# Patient Record
Sex: Female | Born: 1935 | Race: White | Hispanic: No | Smoking: Former smoker
Health system: Southern US, Community
[De-identification: ages and names within clinical notes are randomized; demographics above are authoritative.]

## PROBLEM LIST (undated history)

## (undated) DIAGNOSIS — M1990 Unspecified osteoarthritis, unspecified site: Secondary | ICD-10-CM

## (undated) DIAGNOSIS — E119 Type 2 diabetes mellitus without complications: Secondary | ICD-10-CM

## (undated) DIAGNOSIS — Z794 Long term (current) use of insulin: Secondary | ICD-10-CM

## (undated) DIAGNOSIS — Z7901 Long term (current) use of anticoagulants: Secondary | ICD-10-CM

## (undated) DIAGNOSIS — I5032 Chronic diastolic (congestive) heart failure: Secondary | ICD-10-CM

## (undated) DIAGNOSIS — I10 Essential (primary) hypertension: Secondary | ICD-10-CM

## (undated) DIAGNOSIS — E7800 Pure hypercholesterolemia, unspecified: Secondary | ICD-10-CM

## (undated) DIAGNOSIS — I482 Chronic atrial fibrillation, unspecified: Secondary | ICD-10-CM

## (undated) DIAGNOSIS — K5901 Slow transit constipation: Secondary | ICD-10-CM

## (undated) DIAGNOSIS — D62 Acute posthemorrhagic anemia: Secondary | ICD-10-CM

## (undated) DIAGNOSIS — H409 Unspecified glaucoma: Secondary | ICD-10-CM

## (undated) DIAGNOSIS — E876 Hypokalemia: Secondary | ICD-10-CM

## (undated) HISTORY — DX: Acute posthemorrhagic anemia: D62

## (undated) HISTORY — PX: OTHER SURGICAL HISTORY: SHX169

## (undated) HISTORY — DX: Chronic diastolic (congestive) heart failure: I50.32

## (undated) HISTORY — DX: Chronic atrial fibrillation, unspecified: I48.20

## (undated) HISTORY — DX: Essential (primary) hypertension: I10

## (undated) HISTORY — DX: Type 2 diabetes mellitus without complications: Z79.4

## (undated) HISTORY — DX: Unspecified glaucoma: H40.9

## (undated) HISTORY — DX: Pure hypercholesterolemia, unspecified: E78.00

## (undated) HISTORY — PX: EYE SURGERY: SHX253

## (undated) HISTORY — DX: Type 2 diabetes mellitus without complications: E11.9

## (undated) HISTORY — DX: Slow transit constipation: K59.01

## (undated) HISTORY — DX: Hypokalemia: E87.6

## (undated) HISTORY — DX: Long term (current) use of anticoagulants: Z79.01

## (undated) HISTORY — DX: Unspecified osteoarthritis, unspecified site: M19.90

---

## 1963-07-22 HISTORY — PX: GANGLION CYST EXCISION: SHX1691

## 2006-11-26 ENCOUNTER — Inpatient Hospital Stay (HOSPITAL_COMMUNITY): Admission: EM | Admit: 2006-11-26 | Discharge: 2006-12-02 | Payer: Self-pay | Admitting: Emergency Medicine

## 2006-11-26 ENCOUNTER — Ambulatory Visit: Payer: Self-pay | Admitting: Hospitalist

## 2006-11-27 ENCOUNTER — Encounter (INDEPENDENT_AMBULATORY_CARE_PROVIDER_SITE_OTHER): Payer: Self-pay | Admitting: Interventional Cardiology

## 2007-02-05 ENCOUNTER — Ambulatory Visit (HOSPITAL_COMMUNITY): Admission: RE | Admit: 2007-02-05 | Discharge: 2007-02-05 | Payer: Self-pay | Admitting: Cardiology

## 2007-12-14 ENCOUNTER — Encounter: Admission: RE | Admit: 2007-12-14 | Discharge: 2007-12-14 | Payer: Self-pay | Admitting: Orthopedic Surgery

## 2008-01-27 ENCOUNTER — Encounter: Admission: RE | Admit: 2008-01-27 | Discharge: 2008-01-27 | Payer: Self-pay | Admitting: Neurosurgery

## 2008-07-04 ENCOUNTER — Encounter: Admission: RE | Admit: 2008-07-04 | Discharge: 2008-07-04 | Payer: Self-pay | Admitting: Neurosurgery

## 2010-07-30 ENCOUNTER — Encounter
Admission: RE | Admit: 2010-07-30 | Discharge: 2010-07-30 | Payer: Self-pay | Source: Home / Self Care | Attending: Internal Medicine | Admitting: Internal Medicine

## 2010-08-30 ENCOUNTER — Other Ambulatory Visit: Payer: Self-pay | Admitting: Neurosurgery

## 2010-08-30 DIAGNOSIS — M549 Dorsalgia, unspecified: Secondary | ICD-10-CM

## 2010-08-30 DIAGNOSIS — M5410 Radiculopathy, site unspecified: Secondary | ICD-10-CM

## 2010-09-03 ENCOUNTER — Ambulatory Visit
Admission: RE | Admit: 2010-09-03 | Discharge: 2010-09-03 | Disposition: A | Discharge status: Home / Self Care | Payer: Medicare Other | Source: Ambulatory Visit | Attending: Neurosurgery | Admitting: Neurosurgery

## 2010-09-03 DIAGNOSIS — M549 Dorsalgia, unspecified: Secondary | ICD-10-CM

## 2010-09-03 DIAGNOSIS — M5410 Radiculopathy, site unspecified: Secondary | ICD-10-CM

## 2010-12-03 NOTE — Consult Note (Signed)
Yolanda James, Yolanda James               ACCOUNT NO.:  [id number]   MEDICAL RECORD NO.:  [id number]          PATIENT TYPE:  INP   LOCATION:  1846                         FACILITY:  [facility]   PHYSICIAN:  Francisca December, M.D.  DATE OF BIRTH:  [birth year]   DATE OF CONSULTATION:  11/26/2006  DATE OF DISCHARGE:                                 CONSULTATION   REASON FOR CONSULTATION:  Heart failure.   HISTORY OF PRESENT ILLNESS:  [name] is a 77 year old woman, without  known prior cardiac disease but with a longstanding history  hypertension, who has developed progressive dyspnea and lower extremity  edema over the past 4-5 days.  She denies any orthopnea but has had mild  PND.  No cough, fever, or chills.  She has not felt palpitations,  dizziness, syncope, or near syncope.  She has been more fatigued and has  had a marked decrease in her exercise tolerance.  She finally presented  to [facility] today with these complaints, where an EKG showed atrial  fibrillation with rapid ventricular response.  She subsequently  transferred here to [facility] Emergency Room, where she has received an  IV Cardizem bolus and a constant infusion of 10 mg per hour.  This has  resulted in a decrease in her heart rate 130 beats per minute.  She has  received IV furosemide 20 mg.   PAST MEDICAL HISTORY:  1. Hypertension.  2. Hypercholesterolemia.  3. Glaucoma.  4. There is no prior history of myocardial infarction or heart      failure.   MEDICATIONS ON ADMISSION:  1. Norvasc 10 mg p.o. daily.  2. Simvastatin 20 mg p.o. daily.  3. HCTZ 25 mg p.o. daily.  4. Pilocarpine eye drops, 1 drop both eyes each day.  5. Lumigan eyedrops both eyes daily.   PAST SURGICAL HISTORY:  1. Bilateral cataract and glaucoma surgery.  2. She has had a left wrist cyst removed.   DRUG ALLERGIES:  NONE KNOWN.   FAMILY HISTORY:  Father died, age 61, of myocardial infarction.  Mother  died, age 35, cancer.   SOCIAL  HISTORY:  Married with three adult children, lives alone.  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  Retired Child psychotherapist.  Uses no  tobacco, ethanol, or illicit drugs.   REVIEW OF SYSTEMS:  Negative except as mentioned above.   PHYSICAL EXAMINATION:  VITAL SIGNS:  Blood pressure is 123/61, pulse is  120-132 and irregular regular, respiratory rate is 24, oxygen saturation  of 2 liters 93%.  GENERAL:  This is a well-appearing, comfortable, obese, 77 year old  woman, alert, cooperative in no distress.  HEENT:  Unremarkable.  The head is atraumatic and normocephalic.  The  pupils are equal, round, and reactive to the light.  Extraocular  movements are intact.  Sclerae are anicteric.  Oral mucosa is pink and  moist.  The tongue is not coated.  NECK:  Supple without thyromegaly or masses.  The carotid upstrokes are  normal.  There is no bruit.  There is no jugular venous distension.  CHEST:  Mild bibasilar rales.  Adequate  air movement.  HEART:  Tachycardiac, irregular rhythm, normal S1-S2.  There is a gallop  present.  ABDOMEN:  Obese, soft, nontender, protuberant.  Bowel sounds present all  quadrants.  EXTERNAL GENITALIA:  Without lesions, atrophic.  RECTAL:  Not performed.  EXTREMITIES:  Show full range of motion.  There is 1+ bilateral ankle  edema barely pitting.  Intact in distal pulses.  NEUROLOGICALLY:  No  focal deficits.  SKIN:  Warm, dry, and clear.   ACCESSORY CLINICAL DATA:  Admission hemogram is normal with a hemoglobin  of 12.9, hematocrit 39.5.  Potassium is 3.2.  Normal renal function.  Normal coagulation parameters.  Initial CK-MB and troponin are negative.  Chest x-ray shows cardiac enlargement with interstitial edema, mild.  EKG is as stated, no acute ischemic changes.   IMPRESSION:  1. Atrial fibrillation with rapid ventricular response, onset      uncertain.  2. Mild hypokalemia.  3. Symptoms of heart failure with cardiac enlargement, suspect      systolic  dysfunction.  4. Mild elevation of liver functions.  5. Obesity.  6. History of hypertension.  7. Dyslipidemia.   PLAN:  1. Will follow with you and obtain serial cardiac enzymes x2.  2. Obtain 2-D echocardiogram but not until heart rate more controlled.  3. Agree with daily BNP, D-dimer, and TSH.  4. Begin subcu Lovenox 1 mg/kg per pharmacy protocol.  5. Titrate IV Cardizem drip for heart rate control 5-20 mg per hour,      maintain systolic blood pressure above 045.  6. Strict I and O, daily weights.  7. Otherwise continue home meds.  8. Increase furosemide to 40 mg IV twice daily.  9. Replete potassium aggressively.  10.Further measures to be decided upon once 2-D echocardiogram is      complete.      Francisca December, M.D.  Electronically Signed     JHE/MEDQ  D:  11/26/2006  T:  11/26/2006  Job:  [id number]

## 2010-12-03 NOTE — Discharge Summary (Signed)
Yolanda James, Yolanda James               ACCOUNT NO.:  [id number]   MEDICAL RECORD NO.:  [id number]          PATIENT TYPE:  INP   LOCATION:  2034                         FACILITY:  [facility]   PHYSICIAN:  Yetta Barre, M.D.  DATE OF BIRTH:  06-03-1931   DATE OF ADMISSION:  11/26/2006  DATE OF DISCHARGE:  12/02/2006                               DISCHARGE SUMMARY   DISCHARGE DIAGNOSES:  1. Atrial fibrillation with rapid ventricular rate, newly-diagnosed.  2. Pulmonary edema/diastolic cardiomyopathy.  3. Hypertension.  4. Normocytic anemia.  5. Transaminitis secondary to hepatic congestion, secondary to problem      #2.  6. Glaucoma.   DISCHARGE MEDICATIONS:  1. Sotalol 80 mg p.o. b.i.d.  2. Diltiazem LA 360 mg p.o. daily.  3. Digoxin 0.125 mg p.o. daily.  4. Enalapril 5 mg p.o. b.i.d.  5. Warfarin 5 mg p.o. daily and further titration as an outpatient.   FOLLOWUP:  1. The patient is to follow up at Dr. Amil Amen' office on Dec 07, 2006,      for stat PT/INR check along with an EKG to check for prolongation      of QT interval.  Also we recommend that the patient get a CBC done      at that time to detect any acute drop after starting      anticoagulation.  2. The patient is to follow up with Dr. Amil Amen, on Dec 10, 2006, at      9:00 a.m. for blood pressure check along with titration of      medications p.r.n. for better rate control.  3. We also recommend that the patient follow up with her      ophthalmologist given that her pilocarpine and timolol drops was      stopped secondary to potential systemic side effects given her      current diagnosis.   IMAGES DONE THIS ADMISSION:  1. Chest x-ray done Nov 26, 2006, shows cardiomegaly and interstitial      edema.  2. CT angio of the chest done on Nov 26, 2006, showing no evidence of      pulmonary embolism.  Findings of congestive heart failure.  3. PCI of the ascending aorta.  Prominent borderline enlarged      mediastinal lymph  nodes may be related to CHF.  Followup CT      recommended in 2-3 months.  4. Tiny left lung nodule, attention on followup.  Indeterminate right      thyroid nodule.  5. Chest x-ray done on Nov 28, 2006, showing cardiomegaly and mild      edema with small pleural effusion.  Chest x-ray done on Nov 30, 2006, showing overall improvement in bilateral effusions and mild      interstitial edema since prior study.  No new findings.   CONSULTANTS THIS ADMISSION:  Dr. Amil Amen, cardiology.   PROCEDURES DURING THIS ADMISSION:  None.   BRIEF HISTORY AND PHYSICAL:  Please see medical records for full  details.  Ms. Yolanda James is a 77 year old very pleasant white woman with a  history  of hypertension, hyperlipidemia presenting to the ED on Nov 26, 2006, with complaints of shortness of breath and feeling like her heart  was fluttery over the last 3-4 days.  She has not noticed any chest  pain, fever, chills, cough, or any disruption in her daily life  secondary to the same.  She presented to the urgent care, where she was  found to be in atrial fibrillation with RVR and was asked to go to the  emergency department for further evaluation.  In the ER, the patient was  put on a diltiazem drip.   ALLERGIES:  No known drug allergies.   PAST MEDICAL HISTORY:  1. Hypertension.  2. Hyperlipidemia.  3. Glaucoma.  4. History of bilateral cataracts status post removal.   MEDICATIONS:  1. Norvasc 10 mg p.o. daily.  2. Hydrochlorothiazide 25 mg p.o. daily.  3. Zocor 20 mg p.o. daily.  4. Pilocarpine eye drops q.i.d.  5. Cosopt drops twice a day.  6. Lumigan drops daily.   SUBSTANCE HISTORY:  The patient is a former smoker.  She quit many years  ago.  No history of alcohol or drug use.   SOCIAL HISTORY:  The patient is married, and she has Medicare.   FAMILY HISTORY:  The patient's mother died at 35 of some sort of cancer.  The patient's father died at 40 years of age.  The patient has 13   siblings with diabetes and cancer predominant.  The patient has 3  children.   PHYSICAL EXAMINATION:  VITAL SIGNS:  Temperature 98.4, blood pressure  126/84, pulse 133, respiratory rate at 20, O2 sats 96% on room air.  GENERAL APPEARANCE:  The patient is an overweight woman, in no acute  distress.  HEENT:  Eyes:  Pupils asymmetric without reactivity secondary  to cataract surgery.  ENT:  Oropharynx clear.  No erythema or exudate.  NECK:  Supple.  No adenopathy or thyromegaly.  LUNGS:  Fair air movement, bilateral crackles in lower one-thirds, right  more than left.  HEART:  Irregularly irregular.  Tachycardic.  No murmurs, rubs, or  gallops.  No JVD.  ABDOMEN:  Soft, nondistended, nontender.  Bowel sounds present.  EXTREMITIES:  2+ lower extremity edema to proximal calves.  NEUROLOGICALLY:  Alert and oriented x4.  Grossly nonfocal.  PSYCH:  Appropriate.   LABORATORY ON ADMISSION:  Sodium 130, potassium 3.2, chloride 101,  bicarb 30, BUN 19, creatinine 0.8, glucose 121, total bilirubin 1.1, alk  phos 72, SGOT 48, SGPT 70, total protein 6.8, albumin 3.4, calcium 9.1.  Hemoglobin 12.9, hematocrit 39.5, MCV 81.9, RDW 15.5, ANC 7.1, white  cells 9.5, platelets 194.  PT 14.1, INR 1.1, PTT 29.  EKG showing atrial  fibrillation, poor R wave progression, Q-wave inversion in 1 and avL.   HOSPITAL COURSE:  1. Atrial fibrillation with RVR:  Because of the patient's new onset      atrial fibrillation, uncertain.  Acute coronary syndrome was ruled      out with negative cardiac enzymes x3.  CT angio of the chest ruled      out pulmonary embolus.  The patient did not appear to have any      infectious source given normal CBC and no history of fever or      chills.  The patient's TSH was within normal limits.  A 2D      echocardiogram was done to rule out other abnormalities.  No severe      atrial stenosis  was noted.  The patient was admitted to step-down     unit given that she had atrial  fibrillation with RVR and was on a      diltiazem drip for titration of her medications and close      observation as well.  The patient's heart rate was difficult to      control just on diltiazem drip, hence she was loaded with digoxin      and started on digoxin p.o. daily.  The patient's heart rate did      not respond with the two medications on board as well, hence she      was started on sotalol on Dec 01, 2006.  Beta blocker IV p.r.n. was      used also prior to this for better rate control.  Of note, the      patient's digoxin was held on Nov 29, 2006, given the patient's      persistent hypokalemia.  The patient's rate remained controlled      after Dec 01, 2006; hence, she is stable for discharge today.  She      was started on anticoagulation on day 1 with Lovenox and Coumadin.      The patient will need to follow up at Dr. Randa Evens' office for      further titration and serial PT/INR checks.  On the day of      discharge, the patient's INR was 2.7, which is well within the      therapeutic range.  Also of note, the patient developed a prolonged      QT interval with sotalol mainly 471-500 msec.  Hence, a repeat EKG      needs to be done soon, which has been scheduled for Dec 07, 2006,      to check for progression of the QT interval.  We will defer the      outpatient treatment to the cardiologist.  Also, the patient can      have elective cardioversion after being anticoagulated for at least      3 months.  Again, this will be deferred to outpatient cardiology.  2. Diastolic cardiomyopathy with acute decompensation secondary to      atrial fibrillation:  On admission, the patient had good O2 sats,      but on clinical exam, she did have crackles, and chest x-ray was      compatible with the same.  BNP on admission was not very elevated,      namely, 266.  Serial BNPs that were checked showed no more further      elevation than the same.  Hence, this should not be used in  order      to determine if the patient actually is in heart failure.  The      patient was given Lasix initially to help diuresis and then was      started on Enalapril.  The patient can continue the same at home.  3. Hypertension:  The patient's blood pressure was mildly elevated on      admission.  Her blood pressure medications at home were HCTZ and      Norvasc; however, given her new finding of atrial fibrillation      along with diastolic cardiomyopathy, the patient will be on a      different set of medications, namely, Enalapril, sotalol, and Lasix      p.r.n.  Further titration of the medications can be  done as an      outpatient.  4. Normocytic anemia:  The patient's hemoglobin on admission was 12.9;      however, it dropped to 10.9 by the day of discharge.  Anemia panel      was sent for, and the results show that she had an RBC folate of      1007, ferritin of 146, iron of 39, TIBC 339, vitamin B12 level of     314.  Haptoglobin 247.  Antiphospholipid showed unremarkable      morphology.  Also of note, on 2D echocardiogram, it was stated that      the patient had (?) hematoma which revealed pericardial effusion,      hence, this was concerning.  On further conversations with      cardiologist, this was a misprint; hence, the patient does not have      the hematoma as was described on the 2D echocardiogram.  Anyhow,      the patient had been started on anticoagulation for the atrial      fibrillation, and now that she is on anticoagulation, a repeat CBC      needs to be checked when she sees Dr. Amil Amen on Dec 07, 2006, to      see if hemoglobin remains stable.  5. Glaucoma:  The patient was on pilocarpine and timolol, Cosopt, and      Lumigan as an outpatient.  Since timolol and pilocarpine have      significant side effects and patient has been diagnosed with atrial      fibrillation with RVR, pilocarpine as well as timolol has been      discontinued.  She is to continue  with the other medications.  The      patient was advised to contact her ophthalmologist after discharge      for change in her medications.  6. Transaminitis:  This was thought to be secondary to hepatic      congestion from heart failure.  Serial comprehensive metabolic      panels checked showed down trend of the transaminases.  This can be      checked as an outpatient as well.   DISCHARGE VITALS:  Temperature 97.9, blood pressure 116/65, heart rate  71, respiratory rate of 20, O2 sats 94% on room air.   DISCHARGE LABORATORY:  Sodium 138, potassium 3.9, chloride 103, bicarb  30, BUN 18, creatinine 1.1, glucose 109, calcium 8.4, hemoglobin 10.9,  white cells 7.4, platelets 182, PT 30.4, INR 2.7, BNP 310.      Yetta Barre, M.D.  Electronically Signed     SS/MEDQ  D:  12/02/2006  T:  12/02/2006  Job:  [id number]

## 2010-12-03 NOTE — Op Note (Signed)
NAMEJETTY, BERLAND               ACCOUNT NO.:  [id number]   MEDICAL RECORD NO.:  [id number]          PATIENT TYPE:  OIB   LOCATION:  2899                         FACILITY:  [facility]   PHYSICIAN:  Francisca December, M.D.  DATE OF BIRTH:  1931/03/22   DATE OF PROCEDURE:  02/05/2007  DATE OF DISCHARGE:  02/05/2007                               OPERATIVE REPORT   CARDIOLOGIST:  Francisca December, M.D.   PROCEDURE PERFORMED:  Direct-current cardioversion.   INDICATIONS:  Yolanda James is a 77 year old woman, who has developed  atrial fibrillation.  It was initially with a rapid ventricular  response.  It has been a controlled rate, now with diltiazem ER 360 mg  p.o. q.d., Lanoxin 0.125 mg p.o. q.d. and sotalol 80 mg twice daily.  She has been systemically anticoagulated to a therapeutic level for the  past 3 weeks.  Her INR today is 2.8.  She is to undergo direct-current  cardioversion at this time.   PROCEDURAL NOTE:  While monitoring heart rate, blood pressure, O2  saturation, and ECG, and under the direct supervision of Dr. Michelle Piper of  the anesthesia department, the patient was administered 175 mg of IV  Pentothal.  After establishing deep anesthesia, she received a single  dose of transthoracic energy at 150 J synchronized/biphasic.  This  resulted in prompt return of sinus rhythm/sinus bradycardia with PACs.   IMPRESSION:  Successful direct-current cardioversion of atrial  fibrillation to sinus rhythm.   PLAN:  Decrease diltiazem ER to 180 mg p.o. q.d.  Continue warfarin,  sotalol and digoxin unchanged.  Plan for office visit in 2 to 3 weeks.      Francisca December, M.D.  Electronically Signed     JHE/MEDQ  D:  02/05/2007  T:  02/06/2007  Job:  [id number]

## 2011-05-05 LAB — BASIC METABOLIC PANEL
CO2: 28
Calcium: 8.9
Creatinine, Ser: 0.95
GFR calc Af Amer: >60
Sodium: 139

## 2011-05-05 LAB — PROTIME-INR
INR: 2.6 — ABNORMAL HIGH
Prothrombin Time: 28.9 — ABNORMAL HIGH

## 2011-05-05 LAB — CBC
Hemoglobin: 12.1
MCHC: 33.1
RBC: 4.55
WBC: 9.7

## 2011-05-05 LAB — APTT: aPTT: 45 — ABNORMAL HIGH

## 2013-05-12 ENCOUNTER — Other Ambulatory Visit: Payer: Self-pay | Admitting: Interventional Cardiology

## 2013-05-26 ENCOUNTER — Ambulatory Visit (INDEPENDENT_AMBULATORY_CARE_PROVIDER_SITE_OTHER): Payer: Medicare Other | Admitting: Pharmacist

## 2013-05-26 DIAGNOSIS — I482 Chronic atrial fibrillation, unspecified: Secondary | ICD-10-CM | POA: Insufficient documentation

## 2013-05-26 DIAGNOSIS — I4891 Unspecified atrial fibrillation: Secondary | ICD-10-CM

## 2013-05-26 LAB — POCT INR: INR: 2.3

## 2013-07-07 ENCOUNTER — Ambulatory Visit (INDEPENDENT_AMBULATORY_CARE_PROVIDER_SITE_OTHER): Payer: Medicare Other | Admitting: Pharmacist

## 2013-07-07 DIAGNOSIS — I4891 Unspecified atrial fibrillation: Secondary | ICD-10-CM

## 2013-07-07 LAB — POCT INR: INR: 2.6

## 2013-08-14 ENCOUNTER — Other Ambulatory Visit: Payer: Self-pay | Admitting: *Deleted

## 2013-08-14 DIAGNOSIS — Z79899 Other long term (current) drug therapy: Secondary | ICD-10-CM

## 2013-08-14 DIAGNOSIS — E782 Mixed hyperlipidemia: Secondary | ICD-10-CM

## 2013-08-18 ENCOUNTER — Ambulatory Visit (INDEPENDENT_AMBULATORY_CARE_PROVIDER_SITE_OTHER): Payer: Medicare Other

## 2013-08-18 DIAGNOSIS — I4891 Unspecified atrial fibrillation: Secondary | ICD-10-CM

## 2013-08-18 DIAGNOSIS — Z5181 Encounter for therapeutic drug level monitoring: Secondary | ICD-10-CM

## 2013-08-18 LAB — POCT INR: INR: 2.1

## 2013-08-30 ENCOUNTER — Other Ambulatory Visit: Payer: Self-pay | Admitting: Interventional Cardiology

## 2013-09-02 ENCOUNTER — Other Ambulatory Visit: Payer: Self-pay | Admitting: Interventional Cardiology

## 2013-09-19 ENCOUNTER — Other Ambulatory Visit (INDEPENDENT_AMBULATORY_CARE_PROVIDER_SITE_OTHER): Payer: Medicare Other

## 2013-09-19 DIAGNOSIS — E782 Mixed hyperlipidemia: Secondary | ICD-10-CM

## 2013-09-19 DIAGNOSIS — Z79899 Other long term (current) drug therapy: Secondary | ICD-10-CM

## 2013-09-19 LAB — ALT: ALT: 21 U/L (ref 0–35)

## 2013-09-20 LAB — NMR LIPOPROFILE WITH LIPIDS
Cholesterol, Total: 136 mg/dL (ref <200)
HDL Particle Number: 31.4 umol/L (ref >=30.5)
HDL SIZE: 9.1 nm — AB (ref >=9.2)
HDL-C: 51 mg/dL (ref >=40)
LDL (calc): 61 mg/dL (ref <100)
LDL PARTICLE NUMBER: 892 nmol/L (ref <1000)
LDL Size: 20.4 nm — ABNORMAL LOW (ref >20.5)
LP-IR SCORE: 43 (ref <=45)
Large HDL-P: 5 umol/L (ref >=4.8)
Large VLDL-P: 1.7 nmol/L (ref <=2.7)
Small LDL Particle Number: 491 nmol/L (ref <=527)
TRIGLYCERIDES: 118 mg/dL (ref <150)
VLDL SIZE: 44.4 nm (ref <=46.6)

## 2013-09-23 ENCOUNTER — Telehealth: Payer: Self-pay | Admitting: Cardiology

## 2013-09-23 DIAGNOSIS — E782 Mixed hyperlipidemia: Secondary | ICD-10-CM

## 2013-09-23 NOTE — Telephone Encounter (Signed)
Message copied by Alcario Drought on Fri Sep 23, 2013  1:37 PM ------      Message from: SMART, Maralyn Sago      Created: Thu Sep 22, 2013 11:10 AM       RF:  Diabetes, HTN, age - LDL goal < 100 at least, non-HDL < 130 at least, LDL-P prefer < 1000.      Meds:  Lipitor 80 mg qd, Co-Q 10 100 mg qd, fish oil 2 g/d.      Excellent panel!      ALT normal.      Plan:      1.  No change in meds.      2.  Recheck lipid panel and hepatic panel in 6 months.      Please notify patient, and set up labs. Thanks. ------

## 2013-09-23 NOTE — Telephone Encounter (Signed)
Labs ordered.

## 2013-09-29 ENCOUNTER — Ambulatory Visit (INDEPENDENT_AMBULATORY_CARE_PROVIDER_SITE_OTHER): Payer: Medicare Other

## 2013-09-29 DIAGNOSIS — I4891 Unspecified atrial fibrillation: Secondary | ICD-10-CM

## 2013-09-29 DIAGNOSIS — Z5181 Encounter for therapeutic drug level monitoring: Secondary | ICD-10-CM

## 2013-09-29 LAB — POCT INR: INR: 2.1

## 2013-11-09 ENCOUNTER — Other Ambulatory Visit: Payer: Self-pay | Admitting: Interventional Cardiology

## 2013-11-09 ENCOUNTER — Ambulatory Visit (INDEPENDENT_AMBULATORY_CARE_PROVIDER_SITE_OTHER): Payer: Medicare Other | Admitting: *Deleted

## 2013-11-09 ENCOUNTER — Ambulatory Visit: Payer: Medicare Other | Admitting: Interventional Cardiology

## 2013-11-09 DIAGNOSIS — I4891 Unspecified atrial fibrillation: Secondary | ICD-10-CM

## 2013-11-09 DIAGNOSIS — Z5181 Encounter for therapeutic drug level monitoring: Secondary | ICD-10-CM

## 2013-11-09 LAB — POCT INR: INR: 3

## 2013-11-23 ENCOUNTER — Other Ambulatory Visit: Payer: Self-pay | Admitting: Interventional Cardiology

## 2013-12-21 ENCOUNTER — Other Ambulatory Visit: Payer: Self-pay | Admitting: Interventional Cardiology

## 2013-12-23 ENCOUNTER — Encounter: Payer: Self-pay | Admitting: Cardiology

## 2013-12-23 ENCOUNTER — Ambulatory Visit (INDEPENDENT_AMBULATORY_CARE_PROVIDER_SITE_OTHER): Payer: Medicare Other

## 2013-12-23 ENCOUNTER — Encounter: Payer: Self-pay | Admitting: Interventional Cardiology

## 2013-12-23 ENCOUNTER — Ambulatory Visit (INDEPENDENT_AMBULATORY_CARE_PROVIDER_SITE_OTHER): Payer: Medicare Other | Admitting: Interventional Cardiology

## 2013-12-23 VITALS — BP 130/90 | HR 105 | Ht 64.0 in | Wt 210.0 lb

## 2013-12-23 DIAGNOSIS — I4891 Unspecified atrial fibrillation: Secondary | ICD-10-CM

## 2013-12-23 DIAGNOSIS — E7800 Pure hypercholesterolemia, unspecified: Secondary | ICD-10-CM | POA: Insufficient documentation

## 2013-12-23 DIAGNOSIS — I10 Essential (primary) hypertension: Secondary | ICD-10-CM | POA: Insufficient documentation

## 2013-12-23 DIAGNOSIS — Z5181 Encounter for therapeutic drug level monitoring: Secondary | ICD-10-CM

## 2013-12-23 LAB — POCT INR: INR: 2.7

## 2013-12-23 MED ORDER — DIGOXIN 125 MCG PO TABS: 0.1250 mg | ORAL_TABLET | ORAL | Status: DC

## 2013-12-23 NOTE — Progress Notes (Signed)
Patient ID: Yolanda James, female   DOB: [birth year], 80 y.o.   MRN: [id number]    [address], [place], [place]  [address] Phone: [phone] Fax:  [phone]  Date:  12/23/2013   ID:  Yolanda James, DOB [birth year], MRN [id number]  PCP:  No primary provider on file.      History of Present Illness: Yolanda James is a 80 y.o. female with AFib and hyperlipidemia. Sotalol was stopped in 2012. She is rate controled and anticoagulated. Most BP Readings at home in the 629 systolic range.  HR at home in the 70-90s range typically. Atrial Fibrillation F/U:  lost weight with portion control. Husband died in 2011-09-20. Denies : Chest pain.  Dizziness.  Leg edema.  Orthopnea.  Palpitations.  Shortness of breath.  Syncope.  Since last visit she  No bleeding. No falls. Reports mild swelling of BLE if she doesn't elevate feet. Has not been walking secondary to arthritis. Has been doing stationary bike at home at moderate pace w/o chest pain or SHOB. Has been trying to eat healthier. BP well controlled at home. BP readings around 104/74 to 130/79. Glucose readings are usually less than 100. Reports her last HgbA1c is around 6.1   Wt Readings from Last 3 Encounters:  12/23/13 210 lb (95.255 kg)     Past Medical History  Diagnosis Date  . Diastolic heart failure     associated with AFIB RVR  . A-fib     post DCCV 02/05/2007  . Pure hypercholesterolemia   . Essential hypertension, benign   . Glaucoma   . Osteoarthritis     hands and knees    Current Outpatient Prescriptions  Medication Sig Dispense Refill  . atorvastatin (LIPITOR) 80 MG tablet TAKE 1 TABLET BY MOUTH EVERY DAY  90 tablet  1  . Coenzyme Q10 (CO Q 10) 100 MG CAPS Take 1 capsule by mouth daily.      . digoxin (LANOXIN) 0.125 MG tablet Take 1 tablet (0.125 mg total) by mouth every other day.      Marland Kitchen DILT-XR 180 MG 24 hr capsule TAKE ONE CAPSULE BY MOUTH DAILY  90 capsule  0  . dorzolamide-timolol (COSOPT)  22.3-6.8 MG/ML ophthalmic solution 1 drop 2 (two) times daily.       . enalapril (VASOTEC) 5 MG tablet Take 5 mg by mouth 2 (two) times daily.       . furosemide (LASIX) 40 MG tablet TAKE 1 TABLET BY MOUTH EVERY DAY  90 tablet  0  . glipiZIDE (GLUCOTROL XL) 2.5 MG 24 hr tablet Take 2.5 mg by mouth 2 (two) times daily.       Marland Kitchen LUMIGAN 0.01 % SOLN 1 drop at bedtime.       . metFORMIN (GLUCOPHAGE) 500 MG tablet Take 500 mg by mouth 2 (two) times daily with a meal.       . Omega-3 Fatty Acids (FISH OIL) 1000 MG CAPS Take 1,000 mg by mouth 2 (two) times daily.      . pilocarpine (PILOCAR) 4 % ophthalmic solution 1 drop 4 (four) times daily.       . potassium chloride (MICRO-K) 10 MEQ CR capsule TAKE 1 CAPSULE BY MOUTH EVERY DAY  90 capsule  0  . warfarin (COUMADIN) 5 MG tablet TAKE 1 TABLET BY MOUTH EVERY DAY EXCEPT ON MONDAY TAKE 1&1/2 TABLETS  120 tablet  0   No current facility-administered medications for this visit.  Allergies:   No Known Allergies  Social History:  The patient  reports that she has quit smoking. She does not have any smokeless tobacco history on file. She reports that she does not drink alcohol or use illicit drugs.   Family History:  The patient's family history includes Arrhythmia in her sister.   ROS:  Please see the history of present illness.  No nausea, vomiting.  No fevers, chills.  No focal weakness.  No dysuria.   All other systems reviewed and negative.   PHYSICAL EXAM: VS:  BP 130/90  Pulse 105  Ht 5\' 4"  (1.626 m)  Wt 210 lb (95.255 kg)  BMI 36.03 kg/m2 Well nourished, well developed, in no acute distress HEENT: normal Neck: no JVD, no carotid bruits Cardiac:  normal S1, S2; irregulary irregular, borderline rate control Lungs:  clear to auscultation bilaterally, no wheezing, rhonchi or rales Abd: soft, nontender, no hepatomegaly Ext: no edema Skin: warm and dry Neuro:   no focal abnormalities noted  EKG:  AFib, incomplete LBBB, no significant ST  segment changes    ASSESSMENT AND PLAN:  Atrial fibrillation  Continue Digoxin Tablet, 0.125 Milligram, TAKE ONE TABLET BY MOUTH EVERY other DAY Continue Diltiazem HCl CR Capsule, extended release, 180, TAKE ONE CAPSULE BY MOUTH ONCE DAILY       Notes: Rate controlled in the past.  Slight HR elevation today, after some excitement in th elobby, where she was forgotten and not put in a room.  No sx. Digoxin level 1.6 last year. Coumadin for stroke prevention.    2. Essential hypertension, benign  Continue Enalapril Maleate Tablet, 5 Milligram, TAKE 1 TABLET BY MOUTH TWICE DAILY Notes: BP controlled at home.    3. Hypercholesteremia, pure  Continue Lipitor Tablet, 80 Milligram, TAKE 1 TABLET BY MOUTH ONCE DAILY Notes: Particle number had increased in 2014 but better in 3/15. Trying to reinforce low fat diet.    Preventive Medicine  Adult topics discussed:  Exercise: at least 30 minutes of aerobic exercise, 5 days a week.      Signed, Mina Marble, MD, [facility] 12/23/2013 12:11 PM

## 2013-12-23 NOTE — Patient Instructions (Signed)
Your physician recommends that you continue on your current medications as directed. Please refer to the Current Medication list given to you today.  Your physician wants you to follow-up in: 1 year with Dr. Varanasi. You will receive a reminder letter in the mail two months in advance. If you don't receive a letter, please call our office to schedule the follow-up appointment.  

## 2014-01-23 ENCOUNTER — Other Ambulatory Visit: Payer: Self-pay | Admitting: Interventional Cardiology

## 2014-02-06 ENCOUNTER — Ambulatory Visit (INDEPENDENT_AMBULATORY_CARE_PROVIDER_SITE_OTHER): Payer: Medicare Other

## 2014-02-06 DIAGNOSIS — I4891 Unspecified atrial fibrillation: Secondary | ICD-10-CM

## 2014-02-06 DIAGNOSIS — Z5181 Encounter for therapeutic drug level monitoring: Secondary | ICD-10-CM

## 2014-02-06 LAB — POCT INR: INR: 3

## 2014-02-23 ENCOUNTER — Other Ambulatory Visit: Payer: Self-pay | Admitting: Interventional Cardiology

## 2014-02-24 ENCOUNTER — Other Ambulatory Visit: Payer: Self-pay

## 2014-02-24 MED ORDER — FUROSEMIDE 40 MG PO TABS: ORAL_TABLET | ORAL | Status: DC

## 2014-03-13 ENCOUNTER — Other Ambulatory Visit: Payer: Self-pay

## 2014-03-13 DIAGNOSIS — Z1231 Encounter for screening mammogram for malignant neoplasm of breast: Secondary | ICD-10-CM

## 2014-03-20 ENCOUNTER — Ambulatory Visit (INDEPENDENT_AMBULATORY_CARE_PROVIDER_SITE_OTHER): Payer: Medicare Other | Admitting: Pharmacist

## 2014-03-20 DIAGNOSIS — Z5181 Encounter for therapeutic drug level monitoring: Secondary | ICD-10-CM

## 2014-03-20 DIAGNOSIS — I4891 Unspecified atrial fibrillation: Secondary | ICD-10-CM

## 2014-03-20 LAB — POCT INR: INR: 2.3

## 2014-03-28 ENCOUNTER — Other Ambulatory Visit: Payer: Medicare Other

## 2014-03-28 ENCOUNTER — Ambulatory Visit
Admission: RE | Admit: 2014-03-28 | Discharge: 2014-03-28 | Disposition: A | Discharge status: Home / Self Care | Payer: Medicare Other | Source: Ambulatory Visit

## 2014-03-28 DIAGNOSIS — Z1231 Encounter for screening mammogram for malignant neoplasm of breast: Secondary | ICD-10-CM

## 2014-03-31 ENCOUNTER — Other Ambulatory Visit: Payer: Self-pay | Admitting: Internal Medicine

## 2014-03-31 DIAGNOSIS — R928 Other abnormal and inconclusive findings on diagnostic imaging of breast: Secondary | ICD-10-CM

## 2014-04-11 ENCOUNTER — Ambulatory Visit
Admission: RE | Admit: 2014-04-11 | Discharge: 2014-04-11 | Disposition: A | Discharge status: Home / Self Care | Payer: Medicare Other | Source: Ambulatory Visit | Attending: Internal Medicine | Admitting: Internal Medicine

## 2014-04-11 DIAGNOSIS — R928 Other abnormal and inconclusive findings on diagnostic imaging of breast: Secondary | ICD-10-CM

## 2014-04-22 ENCOUNTER — Other Ambulatory Visit: Payer: Self-pay | Admitting: Interventional Cardiology

## 2014-05-01 ENCOUNTER — Ambulatory Visit (INDEPENDENT_AMBULATORY_CARE_PROVIDER_SITE_OTHER): Payer: Medicare Other

## 2014-05-01 DIAGNOSIS — I4891 Unspecified atrial fibrillation: Secondary | ICD-10-CM

## 2014-05-01 DIAGNOSIS — Z5181 Encounter for therapeutic drug level monitoring: Secondary | ICD-10-CM

## 2014-05-01 LAB — POCT INR: INR: 2.4

## 2014-05-05 ENCOUNTER — Other Ambulatory Visit: Payer: Self-pay | Admitting: Interventional Cardiology

## 2014-06-12 ENCOUNTER — Ambulatory Visit (INDEPENDENT_AMBULATORY_CARE_PROVIDER_SITE_OTHER): Payer: Medicare Other | Admitting: Pharmacist

## 2014-06-12 DIAGNOSIS — I4891 Unspecified atrial fibrillation: Secondary | ICD-10-CM

## 2014-06-12 DIAGNOSIS — Z5181 Encounter for therapeutic drug level monitoring: Secondary | ICD-10-CM

## 2014-06-12 LAB — POCT INR: INR: 2.4

## 2014-06-24 ENCOUNTER — Other Ambulatory Visit: Payer: Self-pay | Admitting: Interventional Cardiology

## 2014-07-24 ENCOUNTER — Ambulatory Visit (INDEPENDENT_AMBULATORY_CARE_PROVIDER_SITE_OTHER): Payer: Medicare Other | Admitting: *Deleted

## 2014-07-24 DIAGNOSIS — Z5181 Encounter for therapeutic drug level monitoring: Secondary | ICD-10-CM

## 2014-07-24 DIAGNOSIS — I4891 Unspecified atrial fibrillation: Secondary | ICD-10-CM

## 2014-07-24 LAB — POCT INR: INR: 2.3

## 2014-08-26 ENCOUNTER — Other Ambulatory Visit: Payer: Self-pay | Admitting: Interventional Cardiology

## 2014-09-07 ENCOUNTER — Ambulatory Visit (INDEPENDENT_AMBULATORY_CARE_PROVIDER_SITE_OTHER): Payer: Medicare Other | Admitting: *Deleted

## 2014-09-07 DIAGNOSIS — I4891 Unspecified atrial fibrillation: Secondary | ICD-10-CM

## 2014-09-07 DIAGNOSIS — Z5181 Encounter for therapeutic drug level monitoring: Secondary | ICD-10-CM

## 2014-09-07 LAB — POCT INR: INR: 2.6

## 2014-10-02 NOTE — Telephone Encounter (Signed)
Has this patient had their usual labs as previously ordered?

## 2014-10-15 ENCOUNTER — Other Ambulatory Visit: Payer: Self-pay | Admitting: Interventional Cardiology

## 2014-10-17 ENCOUNTER — Other Ambulatory Visit: Payer: Self-pay | Admitting: Interventional Cardiology

## 2014-10-19 ENCOUNTER — Ambulatory Visit (INDEPENDENT_AMBULATORY_CARE_PROVIDER_SITE_OTHER): Payer: Medicare Other | Admitting: Pharmacist

## 2014-10-19 DIAGNOSIS — I4891 Unspecified atrial fibrillation: Secondary | ICD-10-CM | POA: Diagnosis not present

## 2014-10-19 DIAGNOSIS — Z5181 Encounter for therapeutic drug level monitoring: Secondary | ICD-10-CM

## 2014-10-19 LAB — POCT INR: INR: 2.5

## 2014-11-29 ENCOUNTER — Other Ambulatory Visit: Payer: Self-pay | Admitting: Interventional Cardiology

## 2014-11-30 ENCOUNTER — Ambulatory Visit (INDEPENDENT_AMBULATORY_CARE_PROVIDER_SITE_OTHER): Payer: Medicare Other | Admitting: *Deleted

## 2014-11-30 DIAGNOSIS — Z5181 Encounter for therapeutic drug level monitoring: Secondary | ICD-10-CM

## 2014-11-30 DIAGNOSIS — I4891 Unspecified atrial fibrillation: Secondary | ICD-10-CM | POA: Diagnosis not present

## 2014-11-30 LAB — POCT INR: INR: 2.5

## 2014-11-30 NOTE — Telephone Encounter (Signed)
Per note 6.5.15

## 2015-01-10 ENCOUNTER — Other Ambulatory Visit: Payer: Self-pay | Admitting: Interventional Cardiology

## 2015-01-12 ENCOUNTER — Ambulatory Visit (INDEPENDENT_AMBULATORY_CARE_PROVIDER_SITE_OTHER): Payer: Medicare Other | Admitting: Interventional Cardiology

## 2015-01-12 ENCOUNTER — Ambulatory Visit (INDEPENDENT_AMBULATORY_CARE_PROVIDER_SITE_OTHER): Payer: Medicare Other | Admitting: *Deleted

## 2015-01-12 ENCOUNTER — Encounter: Payer: Self-pay | Admitting: Interventional Cardiology

## 2015-01-12 VITALS — BP 126/68 | HR 75 | Ht 64.0 in | Wt 217.0 lb

## 2015-01-12 DIAGNOSIS — Z5181 Encounter for therapeutic drug level monitoring: Secondary | ICD-10-CM | POA: Diagnosis not present

## 2015-01-12 DIAGNOSIS — I4891 Unspecified atrial fibrillation: Secondary | ICD-10-CM

## 2015-01-12 DIAGNOSIS — I10 Essential (primary) hypertension: Secondary | ICD-10-CM

## 2015-01-12 DIAGNOSIS — E7800 Pure hypercholesterolemia, unspecified: Secondary | ICD-10-CM

## 2015-01-12 LAB — BASIC METABOLIC PANEL
BUN: 18 mg/dL (ref 6–23)
CO2: 33 meq/L — AB (ref 19–32)
Calcium: 9.5 mg/dL (ref 8.4–10.5)
Chloride: 101 mEq/L (ref 96–112)
Creatinine, Ser: 0.84 mg/dL (ref 0.40–1.20)
GFR: 68.96 mL/min (ref >60.00)
GLUCOSE: 101 mg/dL — AB (ref 70–99)
Potassium: 4 mEq/L (ref 3.5–5.1)
Sodium: 138 mEq/L (ref 135–145)

## 2015-01-12 LAB — POCT INR: INR: 2.3

## 2015-01-12 NOTE — Patient Instructions (Signed)
**Note De-Identified  Obfuscation** Medication Instructions:  Same-no change  Labwork: Digoxin level and BMET today  Testing/Procedures: None  Follow-Up: Your physician wants you to follow-up in: 1 year. You will receive a reminder letter in the mail two months in advance. If you don't receive a letter, please call our office to schedule the follow-up appointment.

## 2015-01-12 NOTE — Progress Notes (Signed)
Patient ID: Yolanda James, female   DOB: 1930/09/29, 81 y.o.   MRN: [id number]     Cardiology Office Note   Date:  01/12/2015   ID:  Yolanda James, DOB January 29, 1931, MRN [id number]  PCP:  [name], MD    No chief complaint on file. f/u AFib   Wt Readings from Last 3 Encounters:  01/12/15 217 lb (98.431 kg)  12/23/13 210 lb (95.255 kg)       History of Present Illness: Yolanda James is a 81 y.o. female  with AFib and hyperlipidemia. Sotalol was stopped in 2010-10-23. She is rate controled and anticoagulated. Most BP Readings at home in the 211 systolic range. HR at home in the 70-90s range typically. Atrial Fibrillation F/U:  lost weight with portion control. Husband died in 10-23-2011. Denies : Chest pain.  Dizziness.  Leg edema.  Orthopnea.  Palpitations.  Shortness of breath.  Syncope.  Since last visit she No bleeding. No falls. Reports mild swelling of BLE if she doesn't elevate feet. Has not been walking secondary to arthritis. She has not been riding the bike as much recently.  She had to stay with her sister to help take care of her.  Blood sugar has been well controlled.  HR at home is in the 90s typically    Past Medical History  Diagnosis Date  . Diastolic heart failure     associated with AFIB RVR  . A-fib     post DCCV 02/05/2007  . Pure hypercholesterolemia   . Essential hypertension, benign   . Glaucoma   . Osteoarthritis     hands and knees    No past surgical history on file.   Current Outpatient Prescriptions  Medication Sig Dispense Refill  . atorvastatin (LIPITOR) 80 MG tablet TAKE 1 TABLET BY MOUTH DAILY 90 tablet 3  . Coenzyme Q10 (CO Q 10) 100 MG CAPS Take 1 capsule by mouth daily.    . CYANOCOBALAMIN IJ Inject 1,000 mcg/mL as directed every 30 (thirty) days.    Marland Kitchen DIGOX 125 MCG tablet TAKE 1 TABLET BY MOUTH EVERY OTHER DAY 45 tablet 0  . DILT-XR 180 MG 24 hr capsule TAKE ONE CAPSULE BY MOUTH DAILY 90 capsule 0  .  dorzolamide-timolol (COSOPT) 22.3-6.8 MG/ML ophthalmic solution Place 1 drop into both eyes 2 (two) times daily.     . enalapril (VASOTEC) 5 MG tablet Take 5 mg by mouth 2 (two) times daily.     Marland Kitchen FREESTYLE LITE test strip every 3 (three) days. Use as directed  0  . furosemide (LASIX) 40 MG tablet TAKE 1 TABLET BY MOUTH EVERY DAY 90 tablet 3  . glipiZIDE (GLUCOTROL XL) 2.5 MG 24 hr tablet Take 2.5 mg by mouth 2 (two) times daily.     . Lancets (FREESTYLE) lancets every 3 (three) days. Use as directed  4  . LUMIGAN 0.01 % SOLN Place 1 drop into both eyes at bedtime.     . metFORMIN (GLUCOPHAGE) 500 MG tablet Take 500 mg by mouth 2 (two) times daily with a meal.     . Omega-3 Fatty Acids (FISH OIL) 1000 MG CAPS Take 1,000 mg by mouth 2 (two) times daily.    . pilocarpine (PILOCAR) 4 % ophthalmic solution Place 1 drop into both eyes 4 (four) times daily.     . potassium chloride (MICRO-K) 10 MEQ CR capsule TAKE ONE CAPSULE BY MOUTH DAILY 90 capsule 0  . warfarin (COUMADIN) 5 MG tablet  TAKE 1 TABLET BY MOUTH EVERY DAY EXCEPT 1&1/2 TABLET EVERY MONDAYS 120 tablet 1   No current facility-administered medications for this visit.    Allergies:   Review of patient's allergies indicates no known allergies.    Social History:  The patient  reports that she has quit smoking. She has never used smokeless tobacco. She reports that she does not drink alcohol or use illicit drugs.   Family History:  The patient's family history includes Arrhythmia in her sister.    ROS:  Please see the history of present illness.   Otherwise, review of systems are positive for joint pains.   All other systems are reviewed and negative.    PHYSICAL EXAM: VS:  BP 126/68 mmHg  Pulse 75  Ht 5\' 4"  (1.626 m)  Wt 217 lb (98.431 kg)  BMI 37.23 kg/m2 , BMI Body mass index is 37.23 kg/(m^2). GEN: Well nourished, well developed, in no acute distress HEENT: normal Neck: no JVD, carotid bruits, or masses Cardiac: irregularly  irregular; no murmurs, rubs, or gallops,no edema  Respiratory:  clear to auscultation bilaterally, normal work of breathing GI: soft, nontender, nondistended, + BS MS: no deformity or atrophy Skin: warm and dry, no rash Neuro:  Strength and sensation are intact Psych: euthymic mood, full affect   EKG:   The ekg ordered today demonstrates AFib, rate controlled   Recent Labs: No results found for requested labs within last 365 days.   Lipid Panel    Component Value Date/Time   CHOL 136 09/19/2013 0955   TRIG 118 09/19/2013 0955   HDL 51 09/19/2013 0955   LDLCALC 61 09/19/2013 0955     Other studies Reviewed: Additional studies/ records that were reviewed today with results demonstrating: Dig level 1.6 in 8/14.   ASSESSMENT AND PLAN:  1. AFib: Rate controlled.  COntinue Dilt and digoxin for rate control.  Annual Dig level. Coumadin for stroke prevention. 2. HTN: Controlled at home.  COntinue current meds.  3. Hyperlipidemia:  Last LDL in our system from 3/15: LDL 61. Checked with Dr. Delfina Redwood more recently.  WIll get those records.     Current medicines are reviewed at length with the patient today.  The patient concerns regarding her medicines were addressed.  The following changes have been made:  No change  Labs/ tests ordered today include:   Orders Placed This Encounter  Procedures  . Digoxin level  . Basic Metabolic Panel (BMET)  . EKG 12-Lead    Recommend 150 minutes/week of aerobic exercise Low fat, low carb, high fiber diet recommended  Disposition:   FU in 1 year   Teresita Madura., MD  01/12/2015 11:22 AM    [facility] [place], [place], [place]  [address] Phone: [phone]; Fax: [phone]

## 2015-01-13 LAB — DIGOXIN LEVEL: Digoxin Level: <0.5 ug/L — ABNORMAL LOW (ref 0.8–2.0)

## 2015-01-16 ENCOUNTER — Other Ambulatory Visit: Payer: Self-pay | Admitting: *Deleted

## 2015-01-19 ENCOUNTER — Other Ambulatory Visit: Payer: Self-pay | Admitting: Cardiology

## 2015-02-02 ENCOUNTER — Other Ambulatory Visit: Payer: Self-pay | Admitting: Interventional Cardiology

## 2015-02-23 ENCOUNTER — Ambulatory Visit (INDEPENDENT_AMBULATORY_CARE_PROVIDER_SITE_OTHER): Payer: Medicare Other | Admitting: *Deleted

## 2015-02-23 DIAGNOSIS — Z5181 Encounter for therapeutic drug level monitoring: Secondary | ICD-10-CM

## 2015-02-23 DIAGNOSIS — I4891 Unspecified atrial fibrillation: Secondary | ICD-10-CM

## 2015-02-23 LAB — POCT INR: INR: 2.1

## 2015-02-25 ENCOUNTER — Other Ambulatory Visit: Payer: Self-pay | Admitting: Interventional Cardiology

## 2015-02-27 ENCOUNTER — Other Ambulatory Visit: Payer: Self-pay | Admitting: Interventional Cardiology

## 2015-04-03 ENCOUNTER — Ambulatory Visit (INDEPENDENT_AMBULATORY_CARE_PROVIDER_SITE_OTHER): Payer: Medicare Other | Admitting: *Deleted

## 2015-04-03 DIAGNOSIS — I4891 Unspecified atrial fibrillation: Secondary | ICD-10-CM | POA: Diagnosis not present

## 2015-04-03 DIAGNOSIS — Z5181 Encounter for therapeutic drug level monitoring: Secondary | ICD-10-CM | POA: Diagnosis not present

## 2015-04-03 LAB — POCT INR: INR: 2.4

## 2015-05-02 ENCOUNTER — Other Ambulatory Visit: Payer: Self-pay

## 2015-05-02 MED ORDER — ATORVASTATIN CALCIUM 80 MG PO TABS: 80.0000 mg | ORAL_TABLET | Freq: Every day | ORAL | Status: DC

## 2015-05-21 ENCOUNTER — Ambulatory Visit (INDEPENDENT_AMBULATORY_CARE_PROVIDER_SITE_OTHER): Payer: Medicare Other | Admitting: *Deleted

## 2015-05-21 DIAGNOSIS — Z5181 Encounter for therapeutic drug level monitoring: Secondary | ICD-10-CM | POA: Diagnosis not present

## 2015-05-21 DIAGNOSIS — I4891 Unspecified atrial fibrillation: Secondary | ICD-10-CM

## 2015-05-21 LAB — POCT INR: INR: 2.5

## 2015-05-25 ENCOUNTER — Other Ambulatory Visit: Payer: Self-pay | Admitting: *Deleted

## 2015-05-25 MED ORDER — WARFARIN SODIUM 5 MG PO TABS: 5.0000 mg | ORAL_TABLET | ORAL | Status: DC

## 2015-06-21 ENCOUNTER — Other Ambulatory Visit: Payer: Self-pay | Admitting: Internal Medicine

## 2015-06-21 ENCOUNTER — Ambulatory Visit
Admission: RE | Admit: 2015-06-21 | Discharge: 2015-06-21 | Disposition: A | Discharge status: Home / Self Care | Payer: Medicare Other | Source: Ambulatory Visit | Attending: Internal Medicine | Admitting: Internal Medicine

## 2015-06-21 DIAGNOSIS — M25551 Pain in right hip: Secondary | ICD-10-CM

## 2015-07-03 ENCOUNTER — Ambulatory Visit (INDEPENDENT_AMBULATORY_CARE_PROVIDER_SITE_OTHER): Payer: Medicare Other | Admitting: *Deleted

## 2015-07-03 DIAGNOSIS — Z5181 Encounter for therapeutic drug level monitoring: Secondary | ICD-10-CM

## 2015-07-03 DIAGNOSIS — I4891 Unspecified atrial fibrillation: Secondary | ICD-10-CM | POA: Diagnosis not present

## 2015-07-03 LAB — POCT INR: INR: 3.2

## 2015-08-14 ENCOUNTER — Ambulatory Visit (INDEPENDENT_AMBULATORY_CARE_PROVIDER_SITE_OTHER): Payer: Medicare Other | Admitting: *Deleted

## 2015-08-14 DIAGNOSIS — I4891 Unspecified atrial fibrillation: Secondary | ICD-10-CM | POA: Diagnosis not present

## 2015-08-14 DIAGNOSIS — Z5181 Encounter for therapeutic drug level monitoring: Secondary | ICD-10-CM | POA: Diagnosis not present

## 2015-08-14 LAB — POCT INR: INR: 2.4

## 2015-08-15 ENCOUNTER — Telehealth: Payer: Self-pay | Admitting: Interventional Cardiology

## 2015-08-15 NOTE — Telephone Encounter (Signed)
OK to hold COumadin 5 days prior to surgery.  Has she been riding the stationary bike?  Please have PharmD assess whether bridge with Lovenox is needed.   Jettie Booze, MD

## 2015-08-15 NOTE — Telephone Encounter (Signed)
Will forward to Cain Saupe for recommendations on whether pt needs Lovenox bridge.

## 2015-08-16 NOTE — Telephone Encounter (Signed)
Pt has a CHADS score of 3 and no history of TIA or stroke.  Okay to hold Coumadin with no Lovenox bridge required.

## 2015-08-16 NOTE — Telephone Encounter (Signed)
Please see Sally's recs below.  No further testing needed prior to surgery.

## 2015-08-16 NOTE — Telephone Encounter (Signed)
Spoke with pt and she states that she has not been riding a stationary bike. Pt states with the problems and pain with her hip she has not felt like riding the bike. Pt denies CP or SOB. Last BP's have been 100/64, 113/72, 107/66- HR 80-90. Pt states that she has been having some swelling in her feet and ankles since she hasn't been moving around as much with her hip problems. Advised pt I would send this information to Dr. [name] for review. Also advised pt that I have sent note to [facility] to review if pt will need a Lovenox bridge. Pt verbalized understanding.

## 2015-08-16 NOTE — Telephone Encounter (Signed)
Spoke with pt and informed her that [facility] said ok to hold Coumadin and that she would not need a Lovenox bridge.  Advised pt that I would route this information to [facility].  Pt verbalized understanding and was appreciative for call back.

## 2015-08-21 ENCOUNTER — Other Ambulatory Visit: Payer: Self-pay | Admitting: Orthopedic Surgery

## 2015-08-24 NOTE — H&P (Signed)
TOTAL HIP ADMISSION H&P  Patient is admitted for right total hip arthroplasty.  Subjective:  Chief Complaint: right hip pain  HPI: Yolanda James, 82 y.o. female, has a history of pain and functional disability in the right hip(s) due to arthritis and patient has failed non-surgical conservative treatments for greater than 12 weeks to include NSAID's and/or analgesics, use of assistive devices and activity modification.  Onset of symptoms was abrupt starting 1 years ago with rapidlly worsening course since that time.The patient noted no past surgery on the right hip(s).  Patient currently rates pain in the right hip at 10 out of 10 with activity. Patient has night pain, worsening of pain with activity and weight bearing, trendelenberg gait, pain that interfers with activities of daily living and pain with passive range of motion. Patient has evidence of joint space narrowing by imaging studies. This condition presents safety issues increasing the risk of falls.  There is no current active infection.  Patient Active Problem List   Diagnosis Date Noted  . Essential hypertension, benign   . A-fib ([place])   . Pure hypercholesterolemia   . Encounter for therapeutic drug monitoring 08/18/2013  . Atrial fibrillation ([place]) 05/26/2013   Past Medical History  Diagnosis Date  . Diastolic heart failure     associated with AFIB RVR  . A-fib     post DCCV 02/05/2007  . Pure hypercholesterolemia   . Essential hypertension, benign   . Glaucoma   . Osteoarthritis     hands and knees    No past surgical history on file.  No prescriptions prior to admission   No Known Allergies  Social History  Substance Use Topics  . Smoking status: Former Research scientist (life sciences)  . Smokeless tobacco: Never Used  . Alcohol Use: No    Family History  Problem Relation Age of Onset  . Arrhythmia Sister      Review of Systems  Constitutional: Negative.   HENT: Negative.   Eyes: Negative.   Cardiovascular:       HTN,  irregular heart beat  Gastrointestinal: Negative.   Genitourinary: Negative.   Musculoskeletal: Positive for joint pain.       RA  Skin: Negative.   Neurological: Negative.   Endo/Heme/Allergies:       Blood sugar problem  Psychiatric/Behavioral: Negative.     Objective:  Physical Exam  Constitutional: She is oriented to person, place, and time. She appears well-developed and well-nourished.  HENT:  Head: Normocephalic and atraumatic.  Eyes: Pupils are equal, round, and reactive to light.  Neck: Normal range of motion. Neck supple.  Cardiovascular: Intact distal pulses.   Respiratory: Effort normal.  Musculoskeletal: She exhibits tenderness.  she does have irritability with hip flexion extension internal/external rotation and log roll on the right.  Minimal pain with flexion extension internal/external rotation on the left.  Mild tenderness in the right groin.  Her calves are soft and nontender.  She is neurovascularly intact distally.  Neurological: She is alert and oriented to person, place, and time.  Skin: Skin is warm and dry.  Psychiatric: She has a normal mood and affect. Her behavior is normal. Judgment and thought content normal.    Vital signs in last 24 hours:    Labs:   Estimated body mass index is 37.23 kg/(m^2) as calculated from the following:   Height as of 01/12/15: 5\' 4"  (1.626 m).   Weight as of 01/12/15: 98.431 kg (217 lb).   Imaging Review Plain radiographs demonstrate near  end-stage arthritis of the weightbearing surface of the hip joint.    Assessment/Plan:  End stage arthritis, right hip(s)  The patient history, physical examination, clinical judgement of the provider and imaging studies are consistent with end stage degenerative joint disease of the right hip(s) and total hip arthroplasty is deemed medically necessary. The treatment options including medical management, injection therapy, arthroscopy and arthroplasty were discussed at length. The  risks and benefits of total hip arthroplasty were presented and reviewed. The risks due to aseptic loosening, infection, stiffness, dislocation/subluxation,  thromboembolic complications and other imponderables were discussed.  The patient acknowledged the explanation, agreed to proceed with the plan and consent was signed. Patient is being admitted for inpatient treatment for surgery, pain control, PT, OT, prophylactic antibiotics, VTE prophylaxis, progressive ambulation and ADL's and discharge planning.The patient is planning to be discharged to skilled nursing facility

## 2015-08-27 ENCOUNTER — Other Ambulatory Visit (HOSPITAL_COMMUNITY): Payer: Self-pay | Admitting: *Deleted

## 2015-08-27 ENCOUNTER — Encounter (HOSPITAL_COMMUNITY): Payer: Self-pay

## 2015-08-27 ENCOUNTER — Encounter (HOSPITAL_COMMUNITY)
Admission: RE | Admit: 2015-08-27 | Discharge: 2015-08-27 | Disposition: A | Discharge status: Home / Self Care | Payer: Medicare Other | Source: Ambulatory Visit | Attending: Orthopedic Surgery | Admitting: Orthopedic Surgery

## 2015-08-27 ENCOUNTER — Ambulatory Visit (HOSPITAL_COMMUNITY)
Admission: RE | Admit: 2015-08-27 | Discharge: 2015-08-27 | Disposition: A | Discharge status: Home / Self Care | Payer: Medicare Other | Source: Ambulatory Visit | Attending: Orthopedic Surgery | Admitting: Orthopedic Surgery

## 2015-08-27 DIAGNOSIS — Z01818 Encounter for other preprocedural examination: Secondary | ICD-10-CM | POA: Diagnosis not present

## 2015-08-27 DIAGNOSIS — I10 Essential (primary) hypertension: Secondary | ICD-10-CM | POA: Insufficient documentation

## 2015-08-27 DIAGNOSIS — I4891 Unspecified atrial fibrillation: Secondary | ICD-10-CM | POA: Insufficient documentation

## 2015-08-27 DIAGNOSIS — M1611 Unilateral primary osteoarthritis, right hip: Secondary | ICD-10-CM | POA: Insufficient documentation

## 2015-08-27 LAB — TYPE AND SCREEN
ABO/RH(D): A POS
ANTIBODY SCREEN: NEGATIVE

## 2015-08-27 LAB — CBC WITH DIFFERENTIAL/PLATELET
BASOS ABS: 0 10*3/uL (ref 0.0–0.1)
BASOS PCT: 0 %
EOS ABS: 0.2 10*3/uL (ref 0.0–0.7)
EOS PCT: 2 %
HCT: 40.7 % (ref 36.0–46.0)
Hemoglobin: 12.7 g/dL (ref 12.0–15.0)
Lymphocytes Relative: 25 %
Lymphs Abs: 2.5 10*3/uL (ref 0.7–4.0)
MCH: 26.9 pg (ref 26.0–34.0)
MCHC: 31.2 g/dL (ref 30.0–36.0)
MCV: 86.2 fL (ref 78.0–100.0)
MONO ABS: 0.7 10*3/uL (ref 0.1–1.0)
Monocytes Relative: 7 %
NEUTROS ABS: 6.6 10*3/uL (ref 1.7–7.7)
Neutrophils Relative %: 66 %
PLATELETS: 209 10*3/uL (ref 150–400)
RBC: 4.72 MIL/uL (ref 3.87–5.11)
RDW: 15.6 % — AB (ref 11.5–15.5)
WBC: 9.9 10*3/uL (ref 4.0–10.5)

## 2015-08-27 LAB — URINALYSIS, ROUTINE W REFLEX MICROSCOPIC
BILIRUBIN URINE: NEGATIVE
GLUCOSE, UA: NEGATIVE mg/dL
HGB URINE DIPSTICK: NEGATIVE
Ketones, ur: NEGATIVE mg/dL
Nitrite: NEGATIVE
Protein, ur: NEGATIVE mg/dL
SPECIFIC GRAVITY, URINE: 1.01 (ref 1.005–1.030)
pH: 5 (ref 5.0–8.0)

## 2015-08-27 LAB — BASIC METABOLIC PANEL
ANION GAP: 15 (ref 5–15)
BUN: 15 mg/dL (ref 6–20)
BUN: 15 mg/dL (ref 4–21)
CALCIUM: 9.3 mg/dL (ref 8.9–10.3)
CO2: 25 mmol/L (ref 22–32)
CREATININE: 0.8 mg/dL (ref 0.5–1.1)
Chloride: 99 mmol/L — ABNORMAL LOW (ref 101–111)
Creatinine, Ser: 0.82 mg/dL (ref 0.44–1.00)
GFR calc Af Amer: >60 mL/min (ref >60)
GLUCOSE: 94 mg/dL (ref 65–99)
GLUCOSE: 94 mg/dL
POTASSIUM: 4 mmol/L (ref 3.4–5.3)
Potassium: 4 mmol/L (ref 3.5–5.1)
SODIUM: 139 mmol/L (ref 137–147)
Sodium: 139 mmol/L (ref 135–145)

## 2015-08-27 LAB — PROTIME-INR
INR: 2.74 — AB (ref 0.00–1.49)
PROTHROMBIN TIME: 28.6 s — AB (ref 11.6–15.2)

## 2015-08-27 LAB — URINE MICROSCOPIC-ADD ON

## 2015-08-27 LAB — GLUCOSE, CAPILLARY: GLUCOSE-CAPILLARY: 86 mg/dL (ref 65–99)

## 2015-08-27 LAB — ABO/RH: ABO/RH(D): A POS

## 2015-08-27 LAB — SURGICAL PCR SCREEN
MRSA, PCR: NEGATIVE
Staphylococcus aureus: NEGATIVE

## 2015-08-27 LAB — APTT: APTT: 37 s (ref 24–37)

## 2015-08-27 NOTE — Pre-Procedure Instructions (Signed)
Yolanda James  08/27/2015      [facility] [address] - [place], [address] [place] [address] Phone: [phone] Fax: [phone]    Your procedure is scheduled on 09/03/2015.  [facility] to [facility] at 9:00 A.M.  Call this number if you have problems the morning of surgery:  [phone]   Remember:  Do not eat food or drink liquids after midnight. On Sunday night   Take these medicines the morning of surgery with A SIP OF WATER : Ditilazem,    & be sure to use the eye drops    Do not wear jewelry, make-up or nail polish.   Do not wear lotions, powders, or perfumes.  You may wear deodorant.   Do not shave 48 hours prior to surgery.  Men may shave face and neck.   Do not bring valuables to the hospital.   [facility] is not responsible for any belongings or valuables.  Contacts, dentures or bridgework may not be worn into surgery.  Leave your suitcase in the car.  After surgery it may be brought to your room.  For patients admitted to the hospital, discharge time will be determined by your treatment team.  Patients discharged the day of surgery will not be allowed to drive home.   Name and phone number of your driver:   With daughter  Special instructions:       How to Manage Your Diabetes Before Surgery   Why is it important to control my blood sugar before and after surgery?   Improving blood sugar levels before and after surgery helps healing and can limit problems.  A way of improving blood sugar control is eating a healthy diet by:  - Eating less sugar and carbohydrates  - Increasing activity/exercise  - Talk with your doctor about reaching your blood sugar goals  High blood sugars (greater than 180 mg/dL) can raise your risk of infections and slow down your recovery so you will need to focus on controlling your diabetes during the weeks before  surgery.  Make sure that the doctor who takes care of your diabetes knows about your planned surgery including the date and location.  How do I manage my blood sugars before surgery?   Check your blood sugar at least 4 times a day, 2 days before surgery to make sure that they are not too high or low.   Check your blood sugar the morning of your surgery when you wake up and every 2               hours until you get to the Short-Stay unit.  If your blood sugar is less than 70 mg/dL, you will need to treat for low blood sugar by:  Treat a low blood sugar (less than 70 mg/dL) with 1/2 cup of clear juice (cranberry or apple), 4 glucose tablets, OR glucose gel.  Recheck blood sugar in 15 minutes after treatment (to make sure it is greater than 70 mg/dL).  If blood sugar is not greater than 70 mg/dL on re-check, call [phone] for further instructions.   Report your blood sugar to the Short-Stay nurse when you get to Short-Stay.  References:  [facility], 2007 "How to Manage your Diabetes Before and After Surgery".  What do I do about my diabetes medications?   Do not  take oral diabetes medicines (pills) the morning of surgery.    Special Instructions: [name] - Preparing for Surgery  Before surgery, you can play an important role.  Because skin is not sterile, your skin needs to be as free of germs as possible.  You can reduce the number of germs on you skin by washing with CHG (chlorahexidine gluconate) soap before surgery.  CHG is an antiseptic cleaner which kills germs and bonds with the skin to continue killing germs even after washing.  Please DO NOT use if you have an allergy to CHG or antibacterial soaps.  If your skin becomes reddened/irritated stop using the CHG and inform your nurse when you arrive at Short Stay.  Do not shave (including legs and underarms) for at least 48 hours prior to the first CHG shower.  You may shave your face.  Please  follow these instructions carefully:   1.  Shower with CHG Soap the night before surgery and the  morning of Surgery.  2.  If you choose to wash your hair, wash your hair first as usual with your  normal shampoo.  3.  After you shampoo, rinse your hair and body thoroughly to remove the  Shampoo.  4.  Use CHG as you would any other liquid soap.  You can apply chg directly to the skin and wash gently with scrungie or a clean washcloth.  5.  Apply the CHG Soap to your body ONLY FROM THE NECK DOWN.    Do not use on open wounds or open sores.  Avoid contact with your eyes, ears, mouth and genitals (private parts).  Wash genitals (private parts)   with your normal soap.  6.  Wash thoroughly, paying special attention to the area where your surgery will be performed.  7.  Thoroughly rinse your body with warm water from the neck down.  8.  DO NOT shower/wash with your normal soap after using and rinsing off   the CHG Soap.  9.  Pat yourself dry with a clean towel.            10.  Wear clean pajamas.            11.  Place clean sheets on your bed the night of your first shower and do not sleep with pets.  Day of Surgery  Do not apply any lotions/deodorants the morning of surgery.  Please wear clean clothes to the hospital/surgery center.  Please read over the following fact sheets that you were given. Pain Booklet, Coughing and Deep Breathing, Blood Transfusion Information, Total Joint Packet, MRSA Information and Surgical Site Infection Prevention

## 2015-08-27 NOTE — Progress Notes (Signed)
Pt. Has clearance from Dr. [name] for cardiac, pt. Also followed by Dr. Carney Living with [facility]. Requeesting records from Dr. Delfina Redwood. Pt. Aware of need to hold Coumadin starting Wed. 08/29/2015.  Pt. Reports much conscientiousness regarding her care of meds.

## 2015-08-28 ENCOUNTER — Encounter (HOSPITAL_COMMUNITY): Payer: Self-pay

## 2015-08-28 LAB — HEMOGLOBIN A1C
Hgb A1c MFr Bld: 6 % — ABNORMAL HIGH (ref 4.8–5.6)
Mean Plasma Glucose: 126 mg/dL

## 2015-08-28 NOTE — Progress Notes (Signed)
Anesthesia Chart Review:  Pt is an 82 year old female scheduled for R total hip arthroplasty on 09/03/2015 with Dr. Mayer Camel.   Cardiologist is Dr. [name], last office visit 01/12/15, f/u recommended in 1 year.   PMH includes:  Atrial fibrillation, diastolic HF, HTN, hyperlipidemia, glaucoma. Former smoker. BMI 35.5  Medications include: lipitor, diltiazem, cosopt ophthalmic, enalapril, lasix, glipizide, metformin, potassium, coumadin. Pt to stop coumadin 08/29/15.    Preoperative labs reviewed.  HgbA1c 6.0, glucose 94. PT 28.6; will repeat DOS.   Chest x-ray 08/27/15 reviewed. No acute cardiopulmonary disease.   EKG 01/12/15: atrial fibrillation (75 bpm). LAD. Septal infarct, age undetermined.   Pt has clearance from Dr. [name] for surgery (telephone encounter dated 08/16/15).   If PT acceptable DOS, I anticipate pt can proceed as scheduled.   Willeen Cass, FNP-BC [facility]/Anesthesiology Phone: [phone] 08/28/2015 2:34 PM

## 2015-08-31 MED ORDER — DEXTROSE-NACL 5-0.45 % IV SOLN: INTRAVENOUS | Status: DC

## 2015-08-31 MED ORDER — CHLORHEXIDINE GLUCONATE 4 % EX LIQD: 60.0000 mL | Freq: Once | CUTANEOUS | Status: DC

## 2015-08-31 MED ORDER — DEXTROSE 5 % IV SOLN
3.0000 g | INTRAVENOUS | Status: AC
  Administered 2015-09-03: 2 g via INTRAVENOUS
  Filled 2015-08-31: qty 3000

## 2015-09-02 DIAGNOSIS — M1611 Unilateral primary osteoarthritis, right hip: Secondary | ICD-10-CM | POA: Diagnosis present

## 2015-09-03 ENCOUNTER — Inpatient Hospital Stay (HOSPITAL_COMMUNITY): Payer: Medicare Other | Admitting: Emergency Medicine

## 2015-09-03 ENCOUNTER — Encounter (HOSPITAL_COMMUNITY): Payer: Self-pay | Admitting: *Deleted

## 2015-09-03 ENCOUNTER — Inpatient Hospital Stay (HOSPITAL_COMMUNITY)
Admission: RE | Admit: 2015-09-03 | Discharge: 2015-09-05 | DRG: 470 | Disposition: A | Discharge status: Skilled Nursing Facility | Payer: Medicare Other | Source: Ambulatory Visit | Attending: Orthopedic Surgery | Admitting: Orthopedic Surgery

## 2015-09-03 ENCOUNTER — Encounter (HOSPITAL_COMMUNITY)
Admission: RE | Disposition: A | Discharge status: Skilled Nursing Facility | Payer: Self-pay | Source: Ambulatory Visit | Attending: Orthopedic Surgery

## 2015-09-03 ENCOUNTER — Inpatient Hospital Stay (HOSPITAL_COMMUNITY): Payer: Medicare Other

## 2015-09-03 DIAGNOSIS — I110 Hypertensive heart disease with heart failure: Secondary | ICD-10-CM | POA: Diagnosis present

## 2015-09-03 DIAGNOSIS — H409 Unspecified glaucoma: Secondary | ICD-10-CM | POA: Diagnosis present

## 2015-09-03 DIAGNOSIS — M25551 Pain in right hip: Secondary | ICD-10-CM | POA: Diagnosis present

## 2015-09-03 DIAGNOSIS — Z7984 Long term (current) use of oral hypoglycemic drugs: Secondary | ICD-10-CM | POA: Diagnosis not present

## 2015-09-03 DIAGNOSIS — D62 Acute posthemorrhagic anemia: Secondary | ICD-10-CM | POA: Diagnosis not present

## 2015-09-03 DIAGNOSIS — Z7901 Long term (current) use of anticoagulants: Secondary | ICD-10-CM | POA: Diagnosis not present

## 2015-09-03 DIAGNOSIS — M1611 Unilateral primary osteoarthritis, right hip: Principal | ICD-10-CM | POA: Diagnosis present

## 2015-09-03 DIAGNOSIS — E119 Type 2 diabetes mellitus without complications: Secondary | ICD-10-CM | POA: Diagnosis present

## 2015-09-03 DIAGNOSIS — Z87891 Personal history of nicotine dependence: Secondary | ICD-10-CM

## 2015-09-03 DIAGNOSIS — I5032 Chronic diastolic (congestive) heart failure: Secondary | ICD-10-CM | POA: Diagnosis present

## 2015-09-03 DIAGNOSIS — Z96649 Presence of unspecified artificial hip joint: Secondary | ICD-10-CM

## 2015-09-03 DIAGNOSIS — I4891 Unspecified atrial fibrillation: Secondary | ICD-10-CM | POA: Diagnosis present

## 2015-09-03 HISTORY — PX: TOTAL HIP ARTHROPLASTY: SHX124

## 2015-09-03 LAB — GLUCOSE, CAPILLARY
GLUCOSE-CAPILLARY: 77 mg/dL (ref 65–99)
GLUCOSE-CAPILLARY: 72 mg/dL (ref 65–99)
GLUCOSE-CAPILLARY: 98 mg/dL (ref 65–99)
GLUCOSE-CAPILLARY: 144 mg/dL — AB (ref 65–99)

## 2015-09-03 LAB — PROTIME-INR
INR: 1.21 (ref 0.00–1.49)
PROTHROMBIN TIME: 15.5 s — AB (ref 11.6–15.2)

## 2015-09-03 SURGERY — ARTHROPLASTY, HIP, TOTAL,POSTERIOR APPROACH
Anesthesia: General | Laterality: Right

## 2015-09-03 MED ORDER — LIDOCAINE HCL (CARDIAC) 20 MG/ML IV SOLN
INTRAVENOUS | Status: AC
  Filled 2015-09-03: qty 5

## 2015-09-03 MED ORDER — ACETAMINOPHEN 325 MG PO TABS
650.0000 mg | ORAL_TABLET | Freq: Four times a day (QID) | ORAL | Status: DC | PRN
  Administered 2015-09-05 (×2): 650 mg via ORAL
  Filled 2015-09-03 (×2): qty 2

## 2015-09-03 MED ORDER — MENTHOL 3 MG MT LOZG
1.0000 | LOZENGE | OROMUCOSAL | Status: DC | PRN
  Filled 2015-09-03: qty 9

## 2015-09-03 MED ORDER — INSULIN ASPART 100 UNIT/ML ~~LOC~~ SOLN
0.0000 [IU] | Freq: Three times a day (TID) | SUBCUTANEOUS | Status: DC
  Administered 2015-09-03: 2 [IU] via SUBCUTANEOUS
  Administered 2015-09-04 (×3): 3 [IU] via SUBCUTANEOUS
  Administered 2015-09-05 (×2): 2 [IU] via SUBCUTANEOUS

## 2015-09-03 MED ORDER — PILOCARPINE HCL 4 % OP SOLN
1.0000 [drp] | Freq: Four times a day (QID) | OPHTHALMIC | Status: DC
  Administered 2015-09-03 – 2015-09-05 (×7): 1 [drp] via OPHTHALMIC
  Filled 2015-09-03: qty 15

## 2015-09-03 MED ORDER — METOCLOPRAMIDE HCL 5 MG/ML IJ SOLN: 5.0000 mg | Freq: Three times a day (TID) | INTRAMUSCULAR | Status: DC | PRN

## 2015-09-03 MED ORDER — ONDANSETRON HCL 4 MG/2ML IJ SOLN: 4.0000 mg | Freq: Once | INTRAMUSCULAR | Status: DC | PRN

## 2015-09-03 MED ORDER — METHOCARBAMOL 500 MG PO TABS
500.0000 mg | ORAL_TABLET | Freq: Four times a day (QID) | ORAL | Status: DC | PRN
  Administered 2015-09-03: 500 mg via ORAL
  Filled 2015-09-03: qty 1

## 2015-09-03 MED ORDER — DILTIAZEM HCL 25 MG/5ML IV SOLN
2.5000 mg | Freq: Once | INTRAVENOUS | Status: DC
  Filled 2015-09-03: qty 5

## 2015-09-03 MED ORDER — ACETAMINOPHEN 650 MG RE SUPP: 650.0000 mg | Freq: Four times a day (QID) | RECTAL | Status: DC | PRN

## 2015-09-03 MED ORDER — NEOSTIGMINE METHYLSULFATE 10 MG/10ML IV SOLN
INTRAVENOUS | Status: AC
  Filled 2015-09-03: qty 1

## 2015-09-03 MED ORDER — LABETALOL HCL 5 MG/ML IV SOLN
5.0000 mg | Freq: Once | INTRAVENOUS | Status: AC
  Administered 2015-09-03: 5 mg via INTRAVENOUS

## 2015-09-03 MED ORDER — ONDANSETRON HCL 4 MG/2ML IJ SOLN
INTRAMUSCULAR | Status: AC
  Filled 2015-09-03: qty 2

## 2015-09-03 MED ORDER — FENTANYL CITRATE (PF) 100 MCG/2ML IJ SOLN
INTRAMUSCULAR | Status: AC
Start: 2015-09-03 — End: 2015-09-04
  Filled 2015-09-03: qty 2

## 2015-09-03 MED ORDER — DILTIAZEM HCL ER 180 MG PO CP24
180.0000 mg | ORAL_CAPSULE | Freq: Every day | ORAL | Status: DC
  Administered 2015-09-04: 180 mg via ORAL
  Filled 2015-09-03 (×4): qty 1

## 2015-09-03 MED ORDER — GLIPIZIDE ER 2.5 MG PO TB24
2.5000 mg | ORAL_TABLET | Freq: Two times a day (BID) | ORAL | Status: DC
  Administered 2015-09-03 – 2015-09-05 (×4): 2.5 mg via ORAL
  Filled 2015-09-03 (×6): qty 1

## 2015-09-03 MED ORDER — OXYCODONE-ACETAMINOPHEN 5-325 MG PO TABS: 1.0000 | ORAL_TABLET | ORAL | Status: DC | PRN

## 2015-09-03 MED ORDER — PHENYLEPHRINE HCL 10 MG/ML IJ SOLN
10.0000 mg | INTRAVENOUS | Status: DC | PRN
  Administered 2015-09-03: 40 ug/min via INTRAVENOUS

## 2015-09-03 MED ORDER — SENNOSIDES-DOCUSATE SODIUM 8.6-50 MG PO TABS: 1.0000 | ORAL_TABLET | Freq: Every evening | ORAL | Status: DC | PRN

## 2015-09-03 MED ORDER — SODIUM CHLORIDE 0.9 % IJ SOLN
INTRAMUSCULAR | Status: AC
  Filled 2015-09-03: qty 10

## 2015-09-03 MED ORDER — DORZOLAMIDE HCL-TIMOLOL MAL 2-0.5 % OP SOLN
1.0000 [drp] | Freq: Two times a day (BID) | OPHTHALMIC | Status: DC
  Administered 2015-09-03 – 2015-09-05 (×4): 1 [drp] via OPHTHALMIC
  Filled 2015-09-03: qty 10

## 2015-09-03 MED ORDER — PHENYLEPHRINE 40 MCG/ML (10ML) SYRINGE FOR IV PUSH (FOR BLOOD PRESSURE SUPPORT)
PREFILLED_SYRINGE | INTRAVENOUS | Status: AC
  Filled 2015-09-03: qty 10

## 2015-09-03 MED ORDER — ENALAPRIL MALEATE 5 MG PO TABS
5.0000 mg | ORAL_TABLET | Freq: Two times a day (BID) | ORAL | Status: DC
  Administered 2015-09-03 – 2015-09-05 (×3): 5 mg via ORAL
  Filled 2015-09-03 (×6): qty 1

## 2015-09-03 MED ORDER — ROCURONIUM BROMIDE 100 MG/10ML IV SOLN
INTRAVENOUS | Status: DC | PRN
  Administered 2015-09-03: 50 mg via INTRAVENOUS
  Administered 2015-09-03: 10 mg via INTRAVENOUS

## 2015-09-03 MED ORDER — LATANOPROST 0.005 % OP SOLN
1.0000 [drp] | Freq: Every day | OPHTHALMIC | Status: DC
  Administered 2015-09-03 – 2015-09-04 (×2): 1 [drp] via OPHTHALMIC
  Filled 2015-09-03: qty 2.5

## 2015-09-03 MED ORDER — NEOSTIGMINE METHYLSULFATE 10 MG/10ML IV SOLN
INTRAVENOUS | Status: DC | PRN
  Administered 2015-09-03: 4 mg via INTRAVENOUS

## 2015-09-03 MED ORDER — OXYCODONE HCL 5 MG PO TABS
5.0000 mg | ORAL_TABLET | ORAL | Status: DC | PRN
  Administered 2015-09-03 – 2015-09-04 (×4): 10 mg via ORAL
  Filled 2015-09-03 (×5): qty 2

## 2015-09-03 MED ORDER — LABETALOL HCL 5 MG/ML IV SOLN
INTRAVENOUS | Status: AC
  Administered 2015-09-03: 5 mg via INTRAVENOUS
  Filled 2015-09-03: qty 4

## 2015-09-03 MED ORDER — GLYCOPYRROLATE 0.2 MG/ML IJ SOLN
INTRAMUSCULAR | Status: AC
  Filled 2015-09-03: qty 3

## 2015-09-03 MED ORDER — METFORMIN HCL 500 MG PO TABS
500.0000 mg | ORAL_TABLET | Freq: Two times a day (BID) | ORAL | Status: DC
  Administered 2015-09-03 – 2015-09-05 (×4): 500 mg via ORAL
  Filled 2015-09-03 (×4): qty 1

## 2015-09-03 MED ORDER — HYDROMORPHONE HCL 1 MG/ML IJ SOLN: 0.5000 mg | INTRAMUSCULAR | Status: DC | PRN

## 2015-09-03 MED ORDER — DEXAMETHASONE SODIUM PHOSPHATE 10 MG/ML IJ SOLN
INTRAMUSCULAR | Status: DC | PRN
  Administered 2015-09-03: 4 mg via INTRAVENOUS

## 2015-09-03 MED ORDER — BUPIVACAINE-EPINEPHRINE 0.5% -1:200000 IJ SOLN
INTRAMUSCULAR | Status: DC | PRN
  Administered 2015-09-03: 20 mL

## 2015-09-03 MED ORDER — TRANEXAMIC ACID 1000 MG/10ML IV SOLN
2000.0000 mg | Freq: Once | INTRAVENOUS | Status: DC
  Filled 2015-09-03: qty 20

## 2015-09-03 MED ORDER — WARFARIN - PHARMACIST DOSING INPATIENT: Freq: Every day | Status: DC

## 2015-09-03 MED ORDER — TIZANIDINE HCL 2 MG PO CAPS: 2.0000 mg | ORAL_CAPSULE | Freq: Three times a day (TID) | ORAL | Status: DC

## 2015-09-03 MED ORDER — BUPIVACAINE-EPINEPHRINE (PF) 0.5% -1:200000 IJ SOLN
INTRAMUSCULAR | Status: AC
  Filled 2015-09-03: qty 30

## 2015-09-03 MED ORDER — EPHEDRINE SULFATE 50 MG/ML IJ SOLN
INTRAMUSCULAR | Status: AC
  Filled 2015-09-03: qty 1

## 2015-09-03 MED ORDER — METHOCARBAMOL 1000 MG/10ML IJ SOLN
500.0000 mg | Freq: Four times a day (QID) | INTRAVENOUS | Status: DC | PRN
  Filled 2015-09-03: qty 5

## 2015-09-03 MED ORDER — TRANEXAMIC ACID 1000 MG/10ML IV SOLN
200.0000 mg | INTRAVENOUS | Status: DC | PRN
  Administered 2015-09-03: 200 mg via INTRAVENOUS

## 2015-09-03 MED ORDER — ONDANSETRON HCL 4 MG/2ML IJ SOLN
INTRAMUSCULAR | Status: DC | PRN
  Administered 2015-09-03: 4 mg via INTRAVENOUS

## 2015-09-03 MED ORDER — WARFARIN SODIUM 5 MG PO TABS
5.0000 mg | ORAL_TABLET | Freq: Once | ORAL | Status: DC
Start: 2015-09-03 — End: 2015-09-03

## 2015-09-03 MED ORDER — ROCURONIUM BROMIDE 50 MG/5ML IV SOLN
INTRAVENOUS | Status: AC
  Filled 2015-09-03: qty 1

## 2015-09-03 MED ORDER — DOCUSATE SODIUM 100 MG PO CAPS
100.0000 mg | ORAL_CAPSULE | Freq: Two times a day (BID) | ORAL | Status: DC
  Administered 2015-09-03 – 2015-09-05 (×4): 100 mg via ORAL
  Filled 2015-09-03 (×5): qty 1

## 2015-09-03 MED ORDER — DIPHENHYDRAMINE HCL 12.5 MG/5ML PO ELIX: 12.5000 mg | ORAL_SOLUTION | ORAL | Status: DC | PRN

## 2015-09-03 MED ORDER — FENTANYL CITRATE (PF) 100 MCG/2ML IJ SOLN
INTRAMUSCULAR | Status: DC | PRN
  Administered 2015-09-03 (×2): 25 ug via INTRAVENOUS
  Administered 2015-09-03 (×2): 50 ug via INTRAVENOUS
  Administered 2015-09-03: 25 ug via INTRAVENOUS
  Administered 2015-09-03: 100 ug via INTRAVENOUS

## 2015-09-03 MED ORDER — BISACODYL 5 MG PO TBEC: 5.0000 mg | DELAYED_RELEASE_TABLET | Freq: Every day | ORAL | Status: DC | PRN

## 2015-09-03 MED ORDER — SODIUM CHLORIDE 0.9 % IR SOLN
Status: DC | PRN
  Administered 2015-09-03: 1000 mL

## 2015-09-03 MED ORDER — DEXAMETHASONE SODIUM PHOSPHATE 4 MG/ML IJ SOLN
INTRAMUSCULAR | Status: AC
  Filled 2015-09-03: qty 1

## 2015-09-03 MED ORDER — FLEET ENEMA 7-19 GM/118ML RE ENEM: 1.0000 | ENEMA | Freq: Once | RECTAL | Status: DC | PRN

## 2015-09-03 MED ORDER — PROPOFOL 10 MG/ML IV BOLUS
INTRAVENOUS | Status: DC | PRN
  Administered 2015-09-03: 100 mg via INTRAVENOUS

## 2015-09-03 MED ORDER — METOCLOPRAMIDE HCL 5 MG PO TABS: 5.0000 mg | ORAL_TABLET | Freq: Three times a day (TID) | ORAL | Status: DC | PRN

## 2015-09-03 MED ORDER — LIDOCAINE HCL (CARDIAC) 20 MG/ML IV SOLN
INTRAVENOUS | Status: DC | PRN
  Administered 2015-09-03: 100 mg via INTRAVENOUS

## 2015-09-03 MED ORDER — FENTANYL CITRATE (PF) 100 MCG/2ML IJ SOLN
25.0000 ug | INTRAMUSCULAR | Status: DC | PRN
  Administered 2015-09-03 (×4): 25 ug via INTRAVENOUS

## 2015-09-03 MED ORDER — PHENYLEPHRINE HCL 10 MG/ML IJ SOLN
INTRAMUSCULAR | Status: DC | PRN
  Administered 2015-09-03: 80 ug via INTRAVENOUS
  Administered 2015-09-03 (×2): 40 ug via INTRAVENOUS

## 2015-09-03 MED ORDER — FENTANYL CITRATE (PF) 250 MCG/5ML IJ SOLN
INTRAMUSCULAR | Status: AC
  Filled 2015-09-03: qty 5

## 2015-09-03 MED ORDER — PHENOL 1.4 % MT LIQD: 1.0000 | OROMUCOSAL | Status: DC | PRN

## 2015-09-03 MED ORDER — DEXAMETHASONE SODIUM PHOSPHATE 10 MG/ML IJ SOLN
10.0000 mg | Freq: Once | INTRAMUSCULAR | Status: AC
  Administered 2015-09-04: 10 mg via INTRAVENOUS
  Filled 2015-09-03: qty 1

## 2015-09-03 MED ORDER — GLYCOPYRROLATE 0.2 MG/ML IJ SOLN
INTRAMUSCULAR | Status: DC | PRN
  Administered 2015-09-03: 0.6 mg via INTRAVENOUS

## 2015-09-03 MED ORDER — KCL IN DEXTROSE-NACL 20-5-0.45 MEQ/L-%-% IV SOLN
INTRAVENOUS | Status: DC
  Administered 2015-09-03: 21:00:00 via INTRAVENOUS
  Filled 2015-09-03 (×2): qty 1000

## 2015-09-03 MED ORDER — FUROSEMIDE 40 MG PO TABS
40.0000 mg | ORAL_TABLET | Freq: Every day | ORAL | Status: DC
  Administered 2015-09-03 – 2015-09-05 (×3): 40 mg via ORAL
  Filled 2015-09-03 (×3): qty 1

## 2015-09-03 MED ORDER — WARFARIN SODIUM 7.5 MG PO TABS
7.5000 mg | ORAL_TABLET | Freq: Once | ORAL | Status: AC
  Administered 2015-09-03: 7.5 mg via ORAL
  Filled 2015-09-03: qty 1

## 2015-09-03 MED ORDER — POTASSIUM CHLORIDE CRYS ER 20 MEQ PO TBCR
10.0000 meq | EXTENDED_RELEASE_TABLET | Freq: Every day | ORAL | Status: DC
  Administered 2015-09-04 – 2015-09-05 (×2): 10 meq via ORAL
  Filled 2015-09-03 (×2): qty 1

## 2015-09-03 MED ORDER — ONDANSETRON HCL 4 MG PO TABS: 4.0000 mg | ORAL_TABLET | Freq: Four times a day (QID) | ORAL | Status: DC | PRN

## 2015-09-03 MED ORDER — CEFUROXIME SODIUM 1.5 G IJ SOLR
INTRAMUSCULAR | Status: AC
  Filled 2015-09-03: qty 1.5

## 2015-09-03 MED ORDER — ONDANSETRON HCL 4 MG/2ML IJ SOLN: 4.0000 mg | Freq: Four times a day (QID) | INTRAMUSCULAR | Status: DC | PRN

## 2015-09-03 MED ORDER — ALUM & MAG HYDROXIDE-SIMETH 200-200-20 MG/5ML PO SUSP: 30.0000 mL | ORAL | Status: DC | PRN

## 2015-09-03 MED ORDER — ESMOLOL HCL 100 MG/10ML IV SOLN
INTRAVENOUS | Status: DC | PRN
  Administered 2015-09-03: 20 mg via INTRAVENOUS

## 2015-09-03 MED ORDER — LACTATED RINGERS IV SOLN
INTRAVENOUS | Status: DC
  Administered 2015-09-03 (×2): via INTRAVENOUS

## 2015-09-03 MED ORDER — SUCCINYLCHOLINE CHLORIDE 20 MG/ML IJ SOLN
INTRAMUSCULAR | Status: AC
  Filled 2015-09-03: qty 1

## 2015-09-03 SURGICAL SUPPLY — 55 items
BLADE SAW SGTL 18X1.27X75 (BLADE) ×2 IMPLANT
BLADE SAW SGTL 18X1.27X75MM (BLADE) ×1
BRUSH FEMORAL CANAL (MISCELLANEOUS) IMPLANT
CAPT HIP TOTAL 2 ×2 IMPLANT
COVER BACK TABLE 24X17X13 BIG (DRAPES) ×2 IMPLANT
COVER SURGICAL LIGHT HANDLE (MISCELLANEOUS) ×4 IMPLANT
DRAPE IMP U-DRAPE 54X76 (DRAPES) ×1 IMPLANT
DRAPE ORTHO SPLIT 77X108 STRL (DRAPES) ×3
DRAPE PROXIMA HALF (DRAPES) ×3 IMPLANT
DRAPE SURG ORHT 6 SPLT 77X108 (DRAPES) ×1 IMPLANT
DRAPE U-SHAPE 47X51 STRL (DRAPES) ×3 IMPLANT
DRILL BIT 7/64X5 (BIT) ×3 IMPLANT
DRSG AQUACEL AG ADV 3.5X10 (GAUZE/BANDAGES/DRESSINGS) ×3 IMPLANT
DURAPREP 26ML APPLICATOR (WOUND CARE) ×3 IMPLANT
ELECT BLADE 4.0 EZ CLEAN MEGAD (MISCELLANEOUS) ×3
ELECT REM PT RETURN 9FT ADLT (ELECTROSURGICAL) ×3
ELECTRODE BLDE 4.0 EZ CLN MEGD (MISCELLANEOUS) IMPLANT
ELECTRODE REM PT RTRN 9FT ADLT (ELECTROSURGICAL) ×1 IMPLANT
GLOVE BIO SURGEON STRL SZ7.5 (GLOVE) ×3 IMPLANT
GLOVE BIO SURGEON STRL SZ8.5 (GLOVE) ×6 IMPLANT
GLOVE BIOGEL PI IND STRL 8 (GLOVE) ×2 IMPLANT
GLOVE BIOGEL PI IND STRL 9 (GLOVE) ×1 IMPLANT
GLOVE BIOGEL PI INDICATOR 8 (GLOVE) ×4
GLOVE BIOGEL PI INDICATOR 9 (GLOVE) ×2
GOWN STRL REUS W/ TWL LRG LVL3 (GOWN DISPOSABLE) ×2 IMPLANT
GOWN STRL REUS W/ TWL XL LVL3 (GOWN DISPOSABLE) ×2 IMPLANT
GOWN STRL REUS W/TWL LRG LVL3 (GOWN DISPOSABLE) ×3
GOWN STRL REUS W/TWL XL LVL3 (GOWN DISPOSABLE) ×3
HANDPIECE INTERPULSE COAX TIP (DISPOSABLE)
HOOD PEEL AWAY FACE SHEILD DIS (HOOD) ×6 IMPLANT
KIT BASIN OR (CUSTOM PROCEDURE TRAY) ×3 IMPLANT
KIT ROOM TURNOVER OR (KITS) ×3 IMPLANT
MANIFOLD NEPTUNE II (INSTRUMENTS) ×3 IMPLANT
NEEDLE 22X1 1/2 (OR ONLY) (NEEDLE) ×3 IMPLANT
NS IRRIG 1000ML POUR BTL (IV SOLUTION) ×3 IMPLANT
PACK TOTAL JOINT (CUSTOM PROCEDURE TRAY) ×3 IMPLANT
PACK UNIVERSAL I (CUSTOM PROCEDURE TRAY) ×3 IMPLANT
PAD ARMBOARD 7.5X6 YLW CONV (MISCELLANEOUS) ×2 IMPLANT
PASSER SUT SWANSON 36MM LOOP (INSTRUMENTS) ×1 IMPLANT
PRESSURIZER FEMORAL UNIV (MISCELLANEOUS) IMPLANT
SAW OSC TIP CART 19.5X105X1.3 (SAW) ×2 IMPLANT
SET HNDPC FAN SPRY TIP SCT (DISPOSABLE) IMPLANT
SUT ETHIBOND 2 V 37 (SUTURE) ×3 IMPLANT
SUT VIC AB 0 CTB1 27 (SUTURE) ×3 IMPLANT
SUT VIC AB 1 CTX 36 (SUTURE) ×3
SUT VIC AB 1 CTX36XBRD ANBCTR (SUTURE) ×1 IMPLANT
SUT VIC AB 2-0 CTB1 (SUTURE) ×3 IMPLANT
SUT VIC AB 3-0 CT1 27 (SUTURE) ×3
SUT VIC AB 3-0 CT1 TAPERPNT 27 (SUTURE) ×1 IMPLANT
SYR CONTROL 10ML LL (SYRINGE) ×3 IMPLANT
TOWEL OR 17X24 6PK STRL BLUE (TOWEL DISPOSABLE) ×3 IMPLANT
TOWEL OR 17X26 10 PK STRL BLUE (TOWEL DISPOSABLE) ×3 IMPLANT
TOWER CARTRIDGE SMART MIX (DISPOSABLE) IMPLANT
TRAY FOLEY CATH 14FR (SET/KITS/TRAYS/PACK) ×2 IMPLANT
WATER STERILE IRR 1000ML POUR (IV SOLUTION) ×4 IMPLANT

## 2015-09-03 NOTE — Op Note (Signed)
PATIENT ID:      Yolanda James  MRN:     [id number] DOB/AGE:    [birth year] / 82 y.o.       OPERATIVE REPORT    DATE OF PROCEDURE:  09/03/2015       PREOPERATIVE DIAGNOSIS:  RIGHT HIP OSTEOARTHRITIS, developmental hip dysplasia                                                      Estimated body mass index is 35.04 kg/(m^2) as calculated from the following:   Height as of 08/27/15: 5\' 6"  (1.676 m).   Weight as of 01/12/15: 98.431 kg (217 lb).     POSTOPERATIVE DIAGNOSIS:  same                                                            PROCEDURE:  R total hip arthroplasty using a 52 mm DePuy Pinnacle  Cup, [employer], 10-degree polyethylene liner index superior  and posterior, a +0 36 mm ceramic head, a [id number] SROM Stem, 20Dsm sleeve  SURGEON: Usama Harkless J    ASSISTANT:   [employer]  (present throughout entire procedure and necessary for timely completion of the procedure)  ANESTHESIA: GET  BLOOD LOSS: 400 FLUID REPLACEMENT: 1600 crystalloid Tranexamic Acid: 2gm topical DRAINS: None COMPLICATIONS: None    INDICATIONS FOR PROCEDURE:Patient with end-stage arthritis of the R hip, the femoral neck has a high neck shaft angles and there is lateral subluxation of the femoral head consistent with developmental hip dysplasia. X-rays show bone-on-bone arthritic changes, peri chondral cyts. Despite conservative measures with observation, anti-inflammatory medicine, narcotics, use of a cane, has severe unremitting pain and can ambulate only 3 blocks before resting.  Patient desires elective right total hip arthroplasty to decrease pain and increase function. The risks, benefits, and alternatives were discussed at length including but not limited to the risks of infection, bleeding, nerve injury, stiffness, blood clots, the need for revision surgery, cardiopulmonary complications, among others, and they were willing to proceed.Benefits have been discussed. Questions answered.     PROCEDURE IN DETAIL: The patient was identified by armband,  received preoperative IV antibiotics in the holding area at [facility], taken to the operating room , appropriate anesthetic monitors  were attached and general endotracheal anesthesia induced. Foley catheter was inserted. Patient was rolled into the L lateral decubitus position and fixed there with a Stulberg Mark II pelvic clamp and the R lower extremity was then prepped and draped  in the usual sterile fashion from the ankle to the hemipelvis. A time-out  procedure was performed. The skin along the lateral hip and thigh  infiltrated with 10 mL of 0.5% Marcaine and epinephrine solution. We  then made a posterolateral approach to the hip. With a #10 blade, 20 cm  incision through skin and subcutaneous tissue down to the level of the  IT band. Small bleeders were identified and cauterized. IT band cut in  line with skin incision exposing the greater trochanter. A Cobra retractor was placed between the gluteus minimus and the superior hip joint capsule, and a spiked  Cobra between the quadratus femoris and the inferior hip joint capsule. This isolated the short  external rotators and piriformis tendons. These were tagged with a #2 Ethibond  suture and cut off their insertion on the intertrochanteric crest. The posterior  capsule was then developed into an acetabular-based flap from Posterior Superior off of the acetabulum out over the femoral neck and back posterior inferior to the acetabular rim. This flap was tagged with two #2 Ethibond sutures and retracted protecting the sciatic nerve. This exposed the arthritic femoral head and osteophytes. The hip was then flexed and internally rotated, dislocating the femoral head and a standard neck cut performed 1 fingerbreadth above the lesser trochanter.  A spiked Cobra was placed in the cotyloid notch and a Hohmann retractor was then used to lever the femur anteriorly off of the anterior  pelvic column. A posterior-inferior wing retractor was placed at the junction of the acetabulum and the ischium completing the acetabular exposure.We then removed the peripheral osteophytes and labrum from the acetabulum. We then reamed the acetabulum up to 51 mm with basket reamers obtaining good coverage in all quadrants, irrigated out with normal  saline solution and hammered into place a 52 mm pinnacle cup in 45  degrees of abduction and about 20 degrees of anteversion. More  peripheral osteophytes removed, the apex hole eliminator was placed, and a 10-degree liner placed with the  IndexPS. The hip was then flexed and internally rotated exposing the  proximal femur, which was entered with the box cutting chisel, the initiating reamer followed by axial reaming up to 15.7mm full depth, and 16 partial depth. We then conically reamed up to 20D. And milled the calcar to 20Dsm. We then placed the trial sleeve, and a trial stem. A trial reduction was then  performed with a +0  36-mm ball on the standard neck and  excellent stability was noted with at 90 of flexion with 70 of  internal rotation and then full extension withexternal rotation. The hip  could not be dislocated in full extension. The knee could easily flex  to about 140 degrees. We also stretched the abductors at this point,  because of the preexisting adductor contractures. All trial components  were then removed. The real sleeve was then hammered into place, through the sleeve we reamed with a 15.5 reamer to ensure that the stem did not become incarcerated. The stem itself was then inserted in 10 deg retroversion in relation to the calcar.  At this point, a + 0 36-mm ceramic head was  hammered on the stem. The hip was reduced. We checked our stability  one more time and found to be excellent. The wound was once again  thoroughly irrigated out with normal saline solution pulse lavage. The  capsular flap and short external rotators were  repaired back to the  intertrochanteric crest through drill holes with a #2 Ethibond suture.  The IT band was closed with running 1 Vicryl suture. The subcutaneous  tissue with 0 and 2-0 undyed Vicryl suture and the skin with running  3-0 Vivryl SQ suture. Aquacil dessing was applied. The patient was then unclamped, rolled supine, awaken extubated and taken to recovery room without difficulty in stable condition.   Haider Hornaday J 09/03/2015, 11:24 AM

## 2015-09-03 NOTE — Progress Notes (Signed)
ANTICOAGULATION CONSULT NOTE - Initial Consult  Pharmacy Consult for warfarin Indication: atrial fibrillation and VTE prophylaxis  No Known Allergies  Patient Measurements:    Vital Signs: Temp: 97.7 F (36.5 C) (02/13 1525) Temp Source: Oral (02/13 1525) BP: 113/71 mmHg (02/13 1525) Pulse Rate: 129 (02/13 1525)  Labs:  Recent Labs  09/03/15 0745  LABPROT 15.5*  INR 1.21    Estimated Creatinine Clearance: 63 mL/min (by C-G formula based on Cr of 0.82).   Medical History: Past Medical History  Diagnosis Date  . Diastolic heart failure (HCC)     associated with AFIB RVR  . A-fib ([place])     post DCCV 02/05/2007  . Pure hypercholesterolemia   . Essential hypertension, benign   . Glaucoma   . Dysrhythmia   . Osteoarthritis     hands and knees, back & knees & "all over"   . Diabetes mellitus without complication ([place])     Medications:  Prescriptions prior to admission  Medication Sig Dispense Refill Last Dose  . atorvastatin (LIPITOR) 80 MG tablet Take 1 tablet (80 mg total) by mouth daily. 90 tablet 3 09/02/2015 at Unknown time  . Coenzyme Q10 (CO Q 10) 100 MG CAPS Take 1 capsule by mouth daily.   09/02/2015 at Unknown time  . DILT-XR 180 MG 24 hr capsule TAKE 1 CAPSULE BY MOUTH DAILY 90 capsule 2 09/03/2015 at Unknown time  . dorzolamide-timolol (COSOPT) 22.3-6.8 MG/ML ophthalmic solution Place 1 drop into both eyes 2 (two) times daily.    09/03/2015 at Unknown time  . enalapril (VASOTEC) 5 MG tablet Take 5 mg by mouth 2 (two) times daily.    09/02/2015 at Unknown time  . furosemide (LASIX) 40 MG tablet TAKE 1 TABLET BY MOUTH EVERY DAY 90 tablet 1 09/02/2015 at Unknown time  . glipiZIDE (GLUCOTROL XL) 2.5 MG 24 hr tablet Take 2.5 mg by mouth 2 (two) times daily.    09/02/2015 at Unknown time  . LUMIGAN 0.01 % SOLN Place 1 drop into both eyes at bedtime.    09/02/2015 at Unknown time  . metFORMIN (GLUCOPHAGE) 500 MG tablet Take 500 mg by mouth 2 (two) times daily with a meal.     09/02/2015 at Unknown time  . Omega-3 Fatty Acids (FISH OIL) 1000 MG CAPS Take 1,000 mg by mouth 2 (two) times daily.   09/02/2015 at Unknown time  . pilocarpine (PILOCAR) 4 % ophthalmic solution Place 1 drop into both eyes 4 (four) times daily.    09/03/2015 at Unknown time  . potassium chloride (MICRO-K) 10 MEQ CR capsule TAKE 1 CAPSULE BY MOUTH DAILY 90 capsule 3 09/02/2015 at Unknown time  . warfarin (COUMADIN) 5 MG tablet TAKE 1 TABLET BY MOUTH EVERY DAY EXCEPT 1&1/2 TABLET EVERY MONDAYS 120 tablet 1 08/28/2015  . FREESTYLE LITE test strip every 3 (three) days. Use as directed  0 Taking  . Lancets (FREESTYLE) lancets every 3 (three) days. Use as directed  4 Taking  . warfarin (COUMADIN) 5 MG tablet Take 1 tablet (5 mg total) by mouth as directed. (Patient not taking: Reported on 08/24/2015) 120 tablet 1    Scheduled:  . [START ON 09/04/2015] dexamethasone  10 mg Intravenous Once  . diltiazem  180 mg Oral Daily  . docusate sodium  100 mg Oral BID  . dorzolamide-timolol  1 drop Both Eyes BID  . enalapril  5 mg Oral BID  . fentaNYL      . furosemide  40 mg Oral  Daily  . glipiZIDE  2.5 mg Oral BID  . insulin aspart  0-15 Units Subcutaneous TID WC  . latanoprost  1 drop Both Eyes QHS  . metFORMIN  500 mg Oral BID WC  . pilocarpine  1 drop Both Eyes QID  . potassium chloride  10 mEq Oral Daily  . warfarin  5 mg Oral ONCE-1800  . Warfarin - Pharmacist Dosing Inpatient   Does not apply q1800    Assessment: 58 yoF on warfarin PTA chronically in AFIB, pharmacy consulted to dose warfarin. Pt now s/p R total hip arthroplasty. INR 1.21  PTA dose is 5 mg daily and 7.5 mg on Mondays  Goal of Therapy:  INR 2-3 Monitor platelets by anticoagulation protocol: Yes   Plan:  Give warfarin 7.5 mg po x 1 Monitor daily INR, CBC, clinical course, s/sx of bleed, PO intake, DDI  Thank you for allowing [place] to participate in this patients care. Jens Som, PharmD Pager: [phone] 09/03/2015,3:34  PM

## 2015-09-03 NOTE — Discharge Instructions (Signed)

## 2015-09-03 NOTE — Transfer of Care (Signed)
Immediate Anesthesia Transfer of Care Note  Patient: Yolanda James  Procedure(s) Performed: Procedure(s): TOTAL HIP ARTHROPLASTY (Right)  Patient Location: PACU  Anesthesia Type:General  Level of Consciousness: awake, alert  and oriented  Airway & Oxygen Therapy: Patient Spontanous Breathing and Patient connected to nasal cannula oxygen  Post-op Assessment: Report given to RN, Post -op Vital signs reviewed and stable and Patient moving all extremities  Post vital signs: Reviewed and stable  Last Vitals:  Filed Vitals:   09/03/15 0728  BP: 135/78  Pulse: 77  Temp: 36.3 C  Resp: 16   HR 119, RR 13 Sats 95% on 4L , BP 123XX123 Complications: No apparent anesthesia complications

## 2015-09-03 NOTE — Evaluation (Signed)
Physical Therapy Evaluation Patient Details Name: Yolanda James MRN: [id number] DOB: 12-08-1930 Today's Date: 09/03/2015   History of Present Illness  82 y.o. female admitted to [facility] on 09/03/15 for elective R THA, posterior approach.  Pt with significant PMHx of diastolic heart failure, A-fib, essential HTN, glaucoma, and DM.    Clinical Impression  Pt is POD #0 and is moving well, min assist with RW, short, in- room distances.  Pt lives alone and would like to go to SNF for rehab with [facility] being her first choice.  PT will follow acutely.      Follow Up Recommendations SNF (Planning on [facility])    Equipment Recommendations  None recommended by PT    Recommendations for Other Services   NA    Precautions / Restrictions Precautions Precautions: Posterior Hip Precaution Booklet Issued: Yes (comment) Precaution Comments: handout given and reviewed.  Exercise packet also given.  Restrictions Weight Bearing Restrictions: Yes RLE Weight Bearing: Weight bearing as tolerated      Mobility  Bed Mobility Overal bed mobility: Needs Assistance Bed Mobility: Supine to Sit     Supine to sit: Min assist     General bed mobility comments: Min assist to help progress right leg to EOB. Verbal cues for hand placement and sequencing.   Transfers Overall transfer level: Needs assistance Equipment used: Rolling walker (2 wheeled) Transfers: Sit to/from Stand Sit to Stand: Min assist         General transfer comment: Min assist to support trunk to get to standing.  Verbal cues for safe hand placement.   Ambulation/Gait Ambulation/Gait assistance: Min assist Ambulation Distance (Feet): 20 Feet Assistive device: Rolling walker (2 wheeled) Gait Pattern/deviations: Step-to pattern;Antalgic     General Gait Details: Pt with moderately antalgic gait pattern, assist for balance.  Verbal cues for safe use of RW and correct LE sequencing.           Balance Overall  balance assessment: Needs assistance Sitting-balance support: Feet supported;No upper extremity supported Sitting balance-Leahy Scale: Good     Standing balance support: Bilateral upper extremity supported Standing balance-Leahy Scale: Poor                               Pertinent Vitals/Pain Pain Assessment: 0-10 Pain Score: 7  Pain Location: right hip Pain Descriptors / Indicators: Aching;Burning Pain Intervention(s): Limited activity within patient's tolerance;Monitored during session;Repositioned;Ice applied    Home Living Family/patient expects to be discharged to:: Skilled nursing facility ([place]) Living Arrangements: Alone Available Help at Discharge: Family;Available PRN/intermittently Type of Home: House Home Access: Level entry     Home Layout: One level Home Equipment: Walker - 2 wheels      Prior Function Level of Independence: Independent with assistive device(s)         Comments: pt reports she used RW PTA to "take weight off of my R hip"        Extremity/Trunk Assessment   Upper Extremity Assessment: Defer to OT evaluation           Lower Extremity Assessment: RLE deficits/detail RLE Deficits / Details: right leg with normal post op pain and weakness.  ankle at least 3/5, knee 3-/5, hip 2/5    Cervical / Trunk Assessment: Normal  Communication   Communication: No difficulties  Cognition Arousal/Alertness: Awake/alert Behavior During Therapy: WFL for tasks assessed/performed Overall Cognitive Status: Within Functional Limits for tasks assessed  Exercises Total Joint Exercises Ankle Circles/Pumps: AROM;Both;10 reps Quad Sets: AROM;Right;10 reps Heel Slides: AAROM;Right;10 reps Hip ABduction/ADduction: AAROM;Right;10 reps      Assessment/Plan    PT Assessment Patient needs continued PT services  PT Diagnosis Difficulty walking;Abnormality of gait;Generalized weakness;Acute pain   PT  Problem List Decreased strength;Decreased range of motion;Decreased activity tolerance;Decreased balance;Decreased mobility;Decreased knowledge of use of DME;Pain;Decreased knowledge of precautions  PT Treatment Interventions DME instruction;Gait training;Stair training;Functional mobility training;Therapeutic activities;Therapeutic exercise;Neuromuscular re-education;Balance training;Patient/family education;Modalities;Manual techniques   PT Goals (Current goals can be found in the Care Plan section) Acute Rehab PT Goals Patient Stated Goal: to go to rehab before going home PT Goal Formulation: With patient Time For Goal Achievement: 09/10/15 Potential to Achieve Goals: Good    Frequency 7X/week   Barriers to discharge Decreased caregiver support Pt lives alone       End of Session   Activity Tolerance: Patient limited by pain Patient left: in chair;with call bell/phone within reach Nurse Communication: Mobility status         Time: 1559-1630 PT Time Calculation (min) (ACUTE ONLY): 31 min   Charges:   PT Evaluation $PT Eval Moderate Complexity: 1 Procedure PT Treatments $Gait Training: 8-22 mins        Riah Kehoe B. Marty, [place], DPT [phone]   09/03/2015, 5:14 PM

## 2015-09-03 NOTE — Interval H&P Note (Signed)
History and Physical Interval Note:  09/03/2015 7:19 AM  Yolanda James  has presented today for surgery, with the diagnosis of RIGHT HIP OSTEOARTHRITIS  The various methods of treatment have been discussed with the patient and family. After consideration of risks, benefits and other options for treatment, the patient has consented to  Procedure(s): TOTAL HIP ARTHROPLASTY (Right) as a surgical intervention .  The patient's history has been reviewed, patient examined, no change in status, stable for surgery.  I have reviewed the patient's chart and labs.  Questions were answered to the patient's satisfaction.     Kerin Salen

## 2015-09-03 NOTE — Progress Notes (Signed)
Utilization review completed.  

## 2015-09-03 NOTE — Anesthesia Preprocedure Evaluation (Addendum)
Anesthesia Evaluation  Patient identified by MRN, date of birth, ID band Patient awake    Reviewed: Allergy & Precautions, NPO status , Patient's Chart, lab work & pertinent test results  History of Anesthesia Complications Negative for: history of anesthetic complications  Airway Mallampati: II  TM Distance: >3 FB Neck ROM: Full    Dental  (+) Teeth Intact, Dental Advisory Given, Upper Dentures, Lower Dentures   Pulmonary former smoker,    Pulmonary exam normal breath sounds clear to auscultation       Cardiovascular hypertension, Pt. on medications (-) angina+CHF  (-) Past MI + dysrhythmias Atrial Fibrillation  Rhythm:Irregular Rate:Abnormal     Neuro/Psych negative neurological ROS  negative psych ROS   GI/Hepatic negative GI ROS, Neg liver ROS,   Endo/Other  diabetes, Type 2, Oral Hypoglycemic AgentsObesity   Renal/GU negative Renal ROS     Musculoskeletal  (+) Arthritis , Osteoarthritis,    Abdominal   Peds  Hematology  (+) Blood dyscrasia (Warfarin therapy), ,   Anesthesia Other Findings Day of surgery medications reviewed with the patient.  Reproductive/Obstetrics                           Anesthesia Physical Anesthesia Plan  ASA: III  Anesthesia Plan: General   Post-op Pain Management:    Induction: Intravenous  Airway Management Planned: Oral ETT  Additional Equipment:   Intra-op Plan:   Post-operative Plan: Extubation in OR  Informed Consent: I have reviewed the patients History and Physical, chart, labs and discussed the procedure including the risks, benefits and alternatives for the proposed anesthesia with the patient or authorized representative who has indicated his/her understanding and acceptance.   Dental advisory given  Plan Discussed with: CRNA  Anesthesia Plan Comments: (Risks/benefits of general anesthesia discussed with patient including risk of  damage to teeth, lips, gum, and tongue, nausea/vomiting, allergic reactions to medications, and the possibility of heart attack, stroke and death.  All patient questions answered.  Patient wishes to proceed.)        Anesthesia Quick Evaluation

## 2015-09-03 NOTE — Anesthesia Postprocedure Evaluation (Signed)
Anesthesia Post Note  Patient: Yolanda James  Procedure(s) Performed: Procedure(s) (LRB): TOTAL HIP ARTHROPLASTY (Right)  Patient location during evaluation: PACU Anesthesia Type: Spinal Level of consciousness: oriented and awake and alert Pain management: pain level controlled Vital Signs Assessment: post-procedure vital signs reviewed and stable Respiratory status: spontaneous breathing, respiratory function stable and patient connected to nasal cannula oxygen Cardiovascular status: blood pressure returned to baseline and stable Postop Assessment: no headache and no backache Anesthetic complications: no Comments: BB and diltiazem titrated for goal HR <100, patient chronically in afib    Last Vitals:  Filed Vitals:   09/03/15 1501 09/03/15 1503  BP:  115/64  Pulse:  95  Temp: 36.4 C   Resp:  9    Last Pain:  Filed Vitals:   09/03/15 1504  PainSc: 2                  Zenaida Deed

## 2015-09-03 NOTE — Anesthesia Procedure Notes (Signed)
Procedure Name: Intubation Date/Time: 09/03/2015 9:58 AM Performed by: Merrilyn Puma B Pre-anesthesia Checklist: Patient identified, Emergency Drugs available, Suction available, Patient being monitored and Timeout performed Patient Re-evaluated:Patient Re-evaluated prior to inductionOxygen Delivery Method: Circle system utilized Preoxygenation: Pre-oxygenation with 100% oxygen Intubation Type: IV induction and Cricoid Pressure applied Ventilation: Mask ventilation without difficulty Laryngoscope Size: Mac and 3 Grade View: Grade I Tube type: Oral Tube size: 7.0 mm Number of attempts: 1 Airway Equipment and Method: Stylet Placement Confirmation: ETT inserted through vocal cords under direct vision,  positive ETCO2,  CO2 detector and breath sounds checked- equal and bilateral Secured at: 21 (gum line ) cm Tube secured with: Tape Dental Injury: Teeth and Oropharynx as per pre-operative assessment

## 2015-09-04 ENCOUNTER — Encounter (HOSPITAL_COMMUNITY): Payer: Self-pay | Admitting: Orthopedic Surgery

## 2015-09-04 LAB — GLUCOSE, CAPILLARY
GLUCOSE-CAPILLARY: 113 mg/dL — AB (ref 65–99)
GLUCOSE-CAPILLARY: 169 mg/dL — AB (ref 65–99)
GLUCOSE-CAPILLARY: 161 mg/dL — AB (ref 65–99)
Glucose-Capillary: 166 mg/dL — ABNORMAL HIGH (ref 65–99)

## 2015-09-04 LAB — HEMOGLOBIN A1C
Hgb A1c MFr Bld: 5.9 % — ABNORMAL HIGH (ref 4.8–5.6)
MEAN PLASMA GLUCOSE: 123 mg/dL

## 2015-09-04 LAB — PROTIME-INR
INR: 1.41 (ref 0.00–1.49)
PROTHROMBIN TIME: 17.3 s — AB (ref 11.6–15.2)

## 2015-09-04 LAB — CBC
HEMATOCRIT: 32.9 % — AB (ref 36.0–46.0)
Hemoglobin: 10.9 g/dL — ABNORMAL LOW (ref 12.0–15.0)
MCH: 28.4 pg (ref 26.0–34.0)
MCHC: 33.1 g/dL (ref 30.0–36.0)
MCV: 85.7 fL (ref 78.0–100.0)
PLATELETS: 195 10*3/uL (ref 150–400)
RBC: 3.84 MIL/uL — AB (ref 3.87–5.11)
RDW: 15.2 % (ref 11.5–15.5)
WBC: 12.7 10*3/uL — AB (ref 4.0–10.5)

## 2015-09-04 MED ORDER — WARFARIN SODIUM 7.5 MG PO TABS
7.5000 mg | ORAL_TABLET | Freq: Once | ORAL | Status: AC
  Administered 2015-09-04: 7.5 mg via ORAL
  Filled 2015-09-04: qty 1

## 2015-09-04 NOTE — NC FL2 (Signed)
[place] LEVEL OF CARE SCREENING TOOL     IDENTIFICATION  Patient Name: Yolanda James Birthdate: 1931/07/06 Sex: female Admission Date (Current Location): 09/03/2015  [facility] and [place] Number:  Herbalist and Address:  [facility], [place] [address] [place], [place] [address]      Provider Number: [id number]  Attending Physician Name and Address:  Frederik Pear, MD  Relative Name and Phone Number:       Current Level of Care: Hospital Recommended Level of Care: [place] Prior Approval Number:    Date Approved/Denied:   PASRR Number:    Discharge Plan: SNF    Current Diagnoses: Patient Active Problem List   Diagnosis Date Noted  . Arthritis of right hip 09/03/2015  . Primary osteoarthritis of right hip 09/02/2015  . Essential hypertension, benign   . A-fib ([place])   . Pure hypercholesterolemia   . Encounter for therapeutic drug monitoring 08/18/2013  . Atrial fibrillation ([place]) 05/26/2013    Orientation RESPIRATION BLADDER Height & Weight     Self, Time, Situation, Place  Normal Continent Weight:   Height:     BEHAVIORAL SYMPTOMS/MOOD NEUROLOGICAL BOWEL NUTRITION STATUS      Continent Diet (Please check dc summary for dietary needs)  AMBULATORY STATUS COMMUNICATION OF NEEDS Skin   Limited Assist Verbally Surgical wounds                       Personal Care Assistance Level of Assistance  Dressing, Bathing Bathing Assistance: Limited assistance   Dressing Assistance: Limited assistance     Functional Limitations Info             SPECIAL CARE FACTORS FREQUENCY  PT (By licensed PT), OT (By licensed OT)     PT Frequency: daily OT Frequency: daily            Contractures Contractures Info: Not present    Additional Factors Info                  Current Medications (09/04/2015):  This is the current hospital active medication list Current Facility-Administered Medications   Medication Dose Route Frequency Provider Last Rate Last Dose  . acetaminophen (TYLENOL) tablet 650 mg  650 mg Oral Q6H PRN Leighton Parody, PA-C       Or  . acetaminophen (TYLENOL) suppository 650 mg  650 mg Rectal Q6H PRN Leighton Parody, PA-C      . alum & mag hydroxide-simeth (MAALOX/MYLANTA) 200-200-20 MG/5ML suspension 30 mL  30 mL Oral Q4H PRN Leighton Parody, PA-C      . bisacodyl (DULCOLAX) EC tablet 5 mg  5 mg Oral Daily PRN Leighton Parody, PA-C      . dextrose 5 % and 0.45 % NaCl with KCl 20 mEq/L infusion   Intravenous Continuous Leighton Parody, PA-C 125 mL/hr at 09/03/15 2116    . diltiazem (DILACOR XR) 24 hr capsule 180 mg  180 mg Oral Daily Leighton Parody, PA-C   180 mg at 09/04/15 [id number]  . diphenhydrAMINE (BENADRYL) 12.5 MG/5ML elixir 12.5-25 mg  12.5-25 mg Oral Q4H PRN Leighton Parody, PA-C      . docusate sodium (COLACE) capsule 100 mg  100 mg Oral BID Leighton Parody, PA-C   100 mg at 09/04/15 [id number]  . dorzolamide-timolol (COSOPT) 22.3-6.8 MG/ML ophthalmic solution 1 drop  1 drop Both Eyes BID Leighton Parody, PA-C  1 drop at 09/04/15 0845  . enalapril (VASOTEC) tablet 5 mg  5 mg Oral BID Leighton Parody, PA-C   5 mg at 09/04/15 0957  . furosemide (LASIX) tablet 40 mg  40 mg Oral Daily Leighton Parody, PA-C   40 mg at 09/04/15 0957  . glipiZIDE (GLUCOTROL XL) 24 hr tablet 2.5 mg  2.5 mg Oral BID Leighton Parody, PA-C   2.5 mg at 09/04/15 0957  . HYDROmorphone (DILAUDID) injection 0.5 mg  0.5 mg Intravenous Q2H PRN Leighton Parody, PA-C      . insulin aspart (novoLOG) injection 0-15 Units  0-15 Units Subcutaneous TID WC Leighton Parody, PA-C   3 Units at 09/04/15 0746  . latanoprost (XALATAN) 0.005 % ophthalmic solution 1 drop  1 drop Both Eyes QHS Leighton Parody, PA-C   1 drop at 09/03/15 2110  . menthol-cetylpyridinium (CEPACOL) lozenge 3 mg  1 lozenge Oral PRN Leighton Parody, PA-C       Or  . phenol (CHLORASEPTIC) mouth spray 1 spray  1 spray Mouth/Throat PRN Leighton Parody, PA-C      . metFORMIN (GLUCOPHAGE) tablet 500 mg  500 mg Oral BID WC Leighton Parody, PA-C   500 mg at 09/04/15 0843  . methocarbamol (ROBAXIN) tablet 500 mg  500 mg Oral Q6H PRN Leighton Parody, PA-C   500 mg at 09/03/15 1730   Or  . methocarbamol (ROBAXIN) 500 mg in dextrose 5 % 50 mL IVPB  500 mg Intravenous Q6H PRN Leighton Parody, PA-C      . metoCLOPramide (REGLAN) tablet 5-10 mg  5-10 mg Oral Q8H PRN Leighton Parody, PA-C       Or  . metoCLOPramide (REGLAN) injection 5-10 mg  5-10 mg Intravenous Q8H PRN Leighton Parody, PA-C      . ondansetron [facility]) tablet 4 mg  4 mg Oral Q6H PRN Leighton Parody, PA-C       Or  . ondansetron [facility]) injection 4 mg  4 mg Intravenous Q6H PRN Leighton Parody, PA-C      . oxyCODONE (Oxy IR/ROXICODONE) immediate release tablet 5-10 mg  5-10 mg Oral Q3H PRN Leighton Parody, PA-C   10 mg at 09/04/15 1300  . pilocarpine (PILOCAR) 4 % ophthalmic solution 1 drop  1 drop Both Eyes QID Leighton Parody, PA-C   1 drop at 09/04/15 1302  . potassium chloride SA (K-DUR,KLOR-CON) CR tablet 10 mEq  10 mEq Oral Daily Leighton Parody, PA-C   10 mEq at 09/04/15 0956  . senna-docusate (Senokot-S) tablet 1 tablet  1 tablet Oral QHS PRN Leighton Parody, PA-C      . sodium phosphate (FLEET) 7-19 GM/118ML enema 1 enema  1 enema Rectal Once PRN Leighton Parody, PA-C      . warfarin (COUMADIN) tablet 7.5 mg  7.5 mg Oral ONCE-1800 Kimberly B Hammons, RPH      . Warfarin - Pharmacist Dosing Inpatient   Does not apply [place], [facility]         Discharge Medications: Please see discharge summary for a list of discharge medications.  Relevant Imaging Results:  Relevant Lab Results:   Additional Information SSN: [id number]  Dulcy Fanny, LCSW

## 2015-09-04 NOTE — Progress Notes (Signed)
Physical Therapy Treatment Patient Details Name: Yolanda James MRN: [id number] DOB: 1931-05-05 Today's Date: 09/04/2015    History of Present Illness 82 y.o. female admitted to [facility] on 09/03/15 for elective R THA, posterior approach.  Pt with significant PMHx of diastolic heart failure, A-fib, essential HTN, glaucoma, and DM.      PT Comments    Patient continues to do well with PT and is motivated to participate in therapy. Pt recalled 3/3 hip precautions at end of session. Continue to progress as tolerated with anticipated d/c to SNF for further skilled PT services.    Follow Up Recommendations  SNF ([place])     Equipment Recommendations  None recommended by PT    Recommendations for Other Services       Precautions / Restrictions Precautions Precautions: Posterior Hip Restrictions Weight Bearing Restrictions: Yes RLE Weight Bearing: Weight bearing as tolerated    Mobility  Bed Mobility Overal bed mobility: Needs Assistance Bed Mobility: Supine to Sit     Supine to sit: Min assist     General bed mobility comments: OOB in chair upon arrival  Transfers Overall transfer level: Needs assistance Equipment used: Rolling walker (2 wheeled) Transfers: Sit to/from Stand Sit to Stand: Min guard         General transfer comment: carry over of safe hand placement and technique; vc for safe use of AD with turning to descend into chair  Ambulation/Gait Ambulation/Gait assistance: Min guard Ambulation Distance (Feet): 100 Feet Assistive device: Rolling walker (2 wheeled) Gait Pattern/deviations: Step-through pattern;Decreased stride length;Antalgic Gait velocity: decreased Gait velocity interpretation: Below normal speed for age/gender General Gait Details: pt with slighlty antalgic gait pattern with improvement noted after intial ~50 ft; vc for position  of RW and posture   Stairs            Wheelchair Mobility    Modified Rankin (Stroke Patients  Only)       Balance Overall balance assessment: Needs assistance Sitting-balance support: No upper extremity supported;Feet supported Sitting balance-Leahy Scale: Good     Standing balance support: Single extremity supported Standing balance-Leahy Scale: Fair                      Cognition Arousal/Alertness: Awake/alert Behavior During Therapy: WFL for tasks assessed/performed Overall Cognitive Status: Within Functional Limits for tasks assessed                      Exercises Total Joint Exercises Ankle Circles/Pumps: AROM;Both;10 reps Quad Sets: AROM;Right;10 reps Heel Slides: AAROM;Right;10 reps Hip ABduction/ADduction: AAROM;Right;10 reps    General Comments General comments (skin integrity, edema, etc.): educated pt with verbal cues and demonstration of hip precaution; pt able to recall 1/3 precautions at beginning of session and 3/3 precautions at end of session      Pertinent Vitals/Pain Pain Assessment: Faces Pain Score: 5  Faces Pain Scale: Hurts a little bit Pain Location: R hip Pain Descriptors / Indicators: Discomfort;Sore Pain Intervention(s): Monitored during session;Premedicated before session;Repositioned;Ice applied    Home Living                      Prior Function            PT Goals (current goals can now be found in the care plan section) Acute Rehab PT Goals Patient Stated Goal: to go to rehab before going home Progress towards PT goals: Progressing toward goals    Frequency  7X/week    PT Plan Current plan remains appropriate    Co-evaluation             End of Session Equipment Utilized During Treatment: Gait belt Activity Tolerance: Patient tolerated treatment well Patient left: in chair;with call bell/phone within reach     Time: 1502-1530 PT Time Calculation (min) (ACUTE ONLY): 28 min  Charges:  $Gait Training: 8-22 mins  $Therapeutic Activity: 8-22 mins                    G Codes:       [place] April, PTA Pager: [phone]   09/04/2015, 3:46 PM

## 2015-09-04 NOTE — Progress Notes (Signed)
Patient ID: Yolanda James, female   DOB: May 31, 1931, 82 y.o.   MRN: [id number] PATIENT ID: Yolanda James  MRN: [id number]  DOB/AGE:  08/07/1930 / 82 y.o.  1 Day Post-Op Procedure(s) (LRB): TOTAL HIP ARTHROPLASTY (Right)    PROGRESS NOTE Subjective: Patient is alert, oriented, no Nausea, no Vomiting, yes passing gas, . Taking PO well. Denies SOB, Chest or Calf Pain. Using Incentive Spirometer, PAS in place. Ambulate WBAT Patient reports pain as  3/10  .    Objective: Vital signs in last 24 hours: Filed Vitals:   09/03/15 1525 09/03/15 1939 09/04/15 0100 09/04/15 0300  BP: 113/71 127/72 116/74 131/61  Pulse: 129 125 116 103  Temp: 97.7 F (36.5 C) 98.2 F (36.8 C) 97.6 F (36.4 C) 98.8 F (37.1 C)  TempSrc: Oral Oral Oral Oral  Resp: 16 15 15 16   SpO2: 96% 96% 77% 100%      Intake/Output from previous day: I/O last 3 completed shifts: In: 1850 [I.V.:1850] Out: 455 [Urine:55; Blood:400]   Intake/Output this shift:     LABORATORY DATA:  Recent Labs  09/03/15 0745  09/03/15 1209 09/03/15 1710 09/04/15 0625 09/04/15 0637  WBC  --   --   --   --   --  12.7*  HGB  --   --   --   --   --  10.9*  HCT  --   --   --   --   --  32.9*  PLT  --   --   --   --   --  195  GLUCAP  --   < > 98 144* 166*  --   INR 1.21  --   --   --   --   --   < > = values in this interval not displayed.  Examination: Neurologically intact ABD soft Neurovascular intact Sensation intact distally Intact pulses distally Dorsiflexion/Plantar flexion intact Incision: moderate drainage No cellulitis present Compartment soft} XR AP&Lat of hip shows well placed\fixed THA  Assessment:   1 Day Post-Op Procedure(s) (LRB): TOTAL HIP ARTHROPLASTY (Right) ADDITIONAL DIAGNOSIS:  Expected Acute Blood Loss Anemia, A fib, NIDDM  Plan: PT/OT WBAT, THA  DVT Prophylaxis: SCDx72 hrs, ASA 325 mg BID x 2 weeks  DISCHARGE PLAN: Skilled Nursing Facility/Rehab, [facility]  DISCHARGE NEEDS: HHPT, CPM,  Walker and 3-in-1 comode seat

## 2015-09-04 NOTE — Progress Notes (Signed)
ANTICOAGULATION CONSULT NOTE - Follow Up Consult  Pharmacy Consult for Coumadin Indication: hx atrial fibrillation, post-op VTE prophylaxis  No Known Allergies  Patient Measurements:    Vital Signs: Temp: 98.8 F (37.1 C) (02/14 0300) Temp Source: Oral (02/14 0300) BP: 131/61 mmHg (02/14 0300) Pulse Rate: 103 (02/14 0300)  Labs:  Recent Labs  09/03/15 0745 09/04/15 0637  HGB  --  10.9*  HCT  --  32.9*  PLT  --  195  LABPROT 15.5* 17.3*  INR 1.21 1.41    Estimated Creatinine Clearance: 63 mL/min (by C-G formula based on Cr of 0.82).   Medications:  Prescriptions prior to admission  Medication Sig Dispense Refill Last Dose  . atorvastatin (LIPITOR) 80 MG tablet Take 1 tablet (80 mg total) by mouth daily. 90 tablet 3 09/02/2015 at Unknown time  . Coenzyme Q10 (CO Q 10) 100 MG CAPS Take 1 capsule by mouth daily.   09/02/2015 at Unknown time  . DILT-XR 180 MG 24 hr capsule TAKE 1 CAPSULE BY MOUTH DAILY 90 capsule 2 09/03/2015 at Unknown time  . dorzolamide-timolol (COSOPT) 22.3-6.8 MG/ML ophthalmic solution Place 1 drop into both eyes 2 (two) times daily.    09/03/2015 at Unknown time  . enalapril (VASOTEC) 5 MG tablet Take 5 mg by mouth 2 (two) times daily.    09/02/2015 at Unknown time  . furosemide (LASIX) 40 MG tablet TAKE 1 TABLET BY MOUTH EVERY DAY 90 tablet 1 09/02/2015 at Unknown time  . glipiZIDE (GLUCOTROL XL) 2.5 MG 24 hr tablet Take 2.5 mg by mouth 2 (two) times daily.    09/02/2015 at Unknown time  . LUMIGAN 0.01 % SOLN Place 1 drop into both eyes at bedtime.    09/02/2015 at Unknown time  . metFORMIN (GLUCOPHAGE) 500 MG tablet Take 500 mg by mouth 2 (two) times daily with a meal.    09/02/2015 at Unknown time  . Omega-3 Fatty Acids (FISH OIL) 1000 MG CAPS Take 1,000 mg by mouth 2 (two) times daily.   09/02/2015 at Unknown time  . pilocarpine (PILOCAR) 4 % ophthalmic solution Place 1 drop into both eyes 4 (four) times daily.    09/03/2015 at Unknown time  . potassium  chloride (MICRO-K) 10 MEQ CR capsule TAKE 1 CAPSULE BY MOUTH DAILY 90 capsule 3 09/02/2015 at Unknown time  . warfarin (COUMADIN) 5 MG tablet TAKE 1 TABLET BY MOUTH EVERY DAY EXCEPT 1&1/2 TABLET EVERY MONDAYS 120 tablet 1 08/28/2015  . FREESTYLE LITE test strip every 3 (three) days. Use as directed  0 Taking  . Lancets (FREESTYLE) lancets every 3 (three) days. Use as directed  4 Taking  . warfarin (COUMADIN) 5 MG tablet Take 1 tablet (5 mg total) by mouth as directed. (Patient not taking: Reported on 08/24/2015) 120 tablet 1     Assessment: 82 yo F on Coumadin PTA for hx afib.  Coumadin held 2/7 in anticipation of R THA.  INR subtherapeutic as expected but trending up after dose yesterday.  Goal of Therapy:  INR 2-3 Monitor platelets by anticoagulation protocol: Yes   Plan:  Repeat Coumadin 7.5mg  PO X 1 tonight Continue daily INR  [employer], Pharm.D., BCPS Clinical Pharmacist Pager [phone] 09/04/2015 10:39 AM

## 2015-09-04 NOTE — Progress Notes (Signed)
OT Cancellation Note  Patient Details Name: Yolanda James MRN: [id number] DOB: June 12, 1931   Cancelled Treatment:    Reason Eval/Treat Not Completed: Other (comment) Pt is Medicare and current D/C plan is SNF. No apparent immediate acute care OT needs, therefore will defer OT to SNF. If OT eval is needed please call Acute Rehab Dept. at [phone] or text page OT at [phone].    Almon Register [id number] 09/04/2015, 7:05 AM

## 2015-09-04 NOTE — Progress Notes (Signed)
Physical Therapy Treatment Patient Details Name: Yolanda James MRN: [id number] DOB: [birth year] Today's Date: 09/04/2015    History of Present Illness 82 y.o. female admitted to [facility] on 09/03/15 for elective R THA, posterior approach.  Pt with significant PMHx of diastolic heart failure, A-fib, essential HTN, glaucoma, and DM.      PT Comments    Pt is POD #1 and is moving well. She was able to progress gait into the hallway with min guard assist and RW.  PT will need to reinforce posterior hip precautions and progress TE during PM session if there is time.    Follow Up Recommendations  SNF ([place])     Equipment Recommendations  None recommended by PT    Recommendations for Other Services   NA     Precautions / Restrictions Precautions Precautions: Posterior Hip Restrictions RLE Weight Bearing: Weight bearing as tolerated    Mobility  Bed Mobility Overal bed mobility: Needs Assistance Bed Mobility: Supine to Sit     Supine to sit: Min assist     General bed mobility comments: Min assist to help progress right leg over EOB.   Transfers Overall transfer level: Needs assistance Equipment used: Rolling walker (2 wheeled) Transfers: Sit to/from Stand Sit to Stand: Min guard         General transfer comment: Min guard assist for safety and balance during transitions to stand.  Verbal cues for safe hand placement.   Ambulation/Gait Ambulation/Gait assistance: Min guard Ambulation Distance (Feet): 75 Feet Assistive device: Rolling walker (2 wheeled) Gait Pattern/deviations: Step-through pattern;Antalgic Gait velocity: decreased Gait velocity interpretation: Below normal speed for age/gender General Gait Details: Moderately antalgic gait pattern which improved with increased gait distance.  Pt needed cues to stop picking up RW during gait.  At baseline she picks up her RW at home to avoid it scraping her new hardwood floors.  We spoke about her getting some  tennis balls for the back casters on her RW when she goes home.           Balance Overall balance assessment: Needs assistance Sitting-balance support: No upper extremity supported;Feet supported Sitting balance-Leahy Scale: Good     Standing balance support: Single extremity supported;Bilateral upper extremity supported Standing balance-Leahy Scale: Fair                      Cognition Arousal/Alertness: Awake/alert Behavior During Therapy: WFL for tasks assessed/performed Overall Cognitive Status: Within Functional Limits for tasks assessed                      Exercises Total Joint Exercises Ankle Circles/Pumps: AROM;Both;10 reps Quad Sets: AROM;Right;10 reps Heel Slides: AAROM;Right;10 reps Hip ABduction/ADduction: AAROM;Right;10 reps        Pertinent Vitals/Pain Pain Assessment: 0-10 Pain Score: 5  Pain Location: right hip Pain Descriptors / Indicators: Aching Pain Intervention(s): Limited activity within patient's tolerance;Monitored during session;Repositioned;Patient requesting pain meds-RN notified           PT Goals (current goals can now be found in the care plan section) Acute Rehab PT Goals Patient Stated Goal: to go to rehab before going home Progress towards PT goals: Progressing toward goals    Frequency  7X/week    PT Plan Current plan remains appropriate       End of Session Equipment Utilized During Treatment: Gait belt Activity Tolerance: Patient limited by pain Patient left: in chair;with call bell/phone within reach;with family/visitor present  Time: [id number] PT Time Calculation (min) (ACUTE ONLY): 33 min  Charges:  $Gait Training: 8-22 mins $Therapeutic Exercise: 8-22 mins                      Kushi Kun B. [place], [place], DPT [phone]   09/04/2015, 12:25 PM

## 2015-09-05 LAB — GLUCOSE, CAPILLARY
Glucose-Capillary: 135 mg/dL — ABNORMAL HIGH (ref 65–99)
Glucose-Capillary: 139 mg/dL — ABNORMAL HIGH (ref 65–99)
Glucose-Capillary: 128 mg/dL — ABNORMAL HIGH (ref 65–99)

## 2015-09-05 LAB — CBC
HCT: 32 % — ABNORMAL LOW (ref 36.0–46.0)
Hemoglobin: 10.2 g/dL — ABNORMAL LOW (ref 12.0–15.0)
MCH: 27.1 pg (ref 26.0–34.0)
MCHC: 31.9 g/dL (ref 30.0–36.0)
MCV: 84.9 fL (ref 78.0–100.0)
Platelets: 200 10*3/uL (ref 150–400)
RBC: 3.77 MIL/uL — ABNORMAL LOW (ref 3.87–5.11)
RDW: 15.4 % (ref 11.5–15.5)
WBC: 17.4 10*3/uL — ABNORMAL HIGH (ref 4.0–10.5)

## 2015-09-05 LAB — CBC AND DIFFERENTIAL: WBC: 17.4 10^3/mL

## 2015-09-05 LAB — PROTIME-INR
INR: 1.8 — AB (ref 0.00–1.49)
PROTHROMBIN TIME: 20.9 s — AB (ref 11.6–15.2)

## 2015-09-05 MED ORDER — WARFARIN SODIUM 5 MG PO TABS: 5.0000 mg | ORAL_TABLET | Freq: Once | ORAL | Status: DC

## 2015-09-05 NOTE — Progress Notes (Signed)
ANTICOAGULATION CONSULT NOTE - Follow Up Consult  Pharmacy Consult for Coumadin Indication: hx atrial fibrillation, post-op VTE prophylaxis  No Known Allergies  Patient Measurements:    Vital Signs: Temp: 97.6 F (36.4 C) (02/15 0352) Temp Source: Oral (02/15 0352) BP: 97/55 mmHg (02/15 0352) Pulse Rate: 80 (02/15 0352)  Labs:  Recent Labs  09/03/15 0745 09/04/15 0637 09/05/15 0555  HGB  --  10.9* 10.2*  HCT  --  32.9* 32.0*  PLT  --  195 200  LABPROT 15.5* 17.3* 20.9*  INR 1.21 1.41 1.80*    Estimated Creatinine Clearance: 63 mL/min (by C-G formula based on Cr of 0.82).   Medications:  Prescriptions prior to admission  Medication Sig Dispense Refill Last Dose  . atorvastatin (LIPITOR) 80 MG tablet Take 1 tablet (80 mg total) by mouth daily. 90 tablet 3 09/02/2015 at Unknown time  . Coenzyme Q10 (CO Q 10) 100 MG CAPS Take 1 capsule by mouth daily.   09/02/2015 at Unknown time  . DILT-XR 180 MG 24 hr capsule TAKE 1 CAPSULE BY MOUTH DAILY 90 capsule 2 09/03/2015 at Unknown time  . dorzolamide-timolol (COSOPT) 22.3-6.8 MG/ML ophthalmic solution Place 1 drop into both eyes 2 (two) times daily.    09/03/2015 at Unknown time  . enalapril (VASOTEC) 5 MG tablet Take 5 mg by mouth 2 (two) times daily.    09/02/2015 at Unknown time  . furosemide (LASIX) 40 MG tablet TAKE 1 TABLET BY MOUTH EVERY DAY 90 tablet 1 09/02/2015 at Unknown time  . glipiZIDE (GLUCOTROL XL) 2.5 MG 24 hr tablet Take 2.5 mg by mouth 2 (two) times daily.    09/02/2015 at Unknown time  . LUMIGAN 0.01 % SOLN Place 1 drop into both eyes at bedtime.    09/02/2015 at Unknown time  . metFORMIN (GLUCOPHAGE) 500 MG tablet Take 500 mg by mouth 2 (two) times daily with a meal.    09/02/2015 at Unknown time  . Omega-3 Fatty Acids (FISH OIL) 1000 MG CAPS Take 1,000 mg by mouth 2 (two) times daily.   09/02/2015 at Unknown time  . pilocarpine (PILOCAR) 4 % ophthalmic solution Place 1 drop into both eyes 4 (four) times daily.     09/03/2015 at Unknown time  . potassium chloride (MICRO-K) 10 MEQ CR capsule TAKE 1 CAPSULE BY MOUTH DAILY 90 capsule 3 09/02/2015 at Unknown time  . warfarin (COUMADIN) 5 MG tablet TAKE 1 TABLET BY MOUTH EVERY DAY EXCEPT 1&1/2 TABLET EVERY MONDAYS 120 tablet 1 08/28/2015  . FREESTYLE LITE test strip every 3 (three) days. Use as directed  0 Taking  . Lancets (FREESTYLE) lancets every 3 (three) days. Use as directed  4 Taking  . warfarin (COUMADIN) 5 MG tablet Take 1 tablet (5 mg total) by mouth as directed. (Patient not taking: Reported on 08/24/2015) 120 tablet 1     Assessment: 82 yo F on Coumadin PTA for hx afib.  Coumadin held 2/7 in anticipation of R THA.  INR trending up nicely (1.8) today. Home regimen 5mg  daily except 7.5mg  on Monday.   Goal of Therapy:  INR 2-3 Monitor platelets by anticoagulation protocol: Yes   Plan:  Coumadin 5mg  po x1 tonight per home regimen.  Continue daily INR  Sloan Leiter, PharmD, BCPS Clinical Pharmacist [phone]  09/05/2015 11:59 AM

## 2015-09-05 NOTE — Clinical Social Work Note (Addendum)
Patient will discharge today per MD order. Patient will discharge to: [facility] SNF RN to call report prior to transportation to: [phone] Transportation: [facility] to be arranged after paperwork completed by SNF at bedside  CSW sent discharge summary to SNF for review.  Packet is complete.  RN, patient and daughter aware of discharge plans.  PASARR received [id number] A.  SNF aware.  Nonnie Done, LCSW [phone]  5N1-9, 2S 15-16 and Psychiatric Service Line  Licensed Clinical Social Worker

## 2015-09-05 NOTE — Progress Notes (Signed)
PATIENT ID: Yolanda James  MRN: [id number]  DOB/AGE:  [birth year] / 82 y.o.  2 Days Post-Op Procedure(s) (LRB): TOTAL HIP ARTHROPLASTY (Right)    PROGRESS NOTE Subjective: Patient is alert, oriented, no Nausea, no Vomiting, yes passing gas, . Taking PO well. Denies SOB, Chest or Calf Pain. Using Incentive Spirometer, PAS in place. Ambulate WBAT with pt walking 100 ft with therapy. Patient reports pain as  2/10  .    Objective: Vital signs in last 24 hours: Filed Vitals:   09/04/15 0300 09/04/15 1300 09/04/15 1900 09/05/15 0352  BP: 131/61 101/68 99/61 97/55   Pulse: 103 129 74 80  Temp: 98.8 F (37.1 C) 98.1 F (36.7 C) 97.3 F (36.3 C) 97.6 F (36.4 C)  TempSrc: Oral  Oral Oral  Resp: 16 16 16 16   SpO2: 100% 90% 72% 99%      Intake/Output from previous day: I/O last 3 completed shifts: In: 1946.7 [P.O.:480; I.V.:1466.7] Out: -    Intake/Output this shift:     LABORATORY DATA:  Recent Labs  09/04/15 0637  09/04/15 2255 09/05/15 0552 09/05/15 0555 09/05/15 0559  WBC 12.7*  --   --   --  17.4*  --   HGB 10.9*  --   --   --  10.2*  --   HCT 32.9*  --   --   --  32.0*  --   PLT 195  --   --   --  200  --   GLUCAP  --   < > 161* 139*  --  135*  INR 1.41  --   --   --  1.80*  --   < > = values in this interval not displayed.  Examination: Neurologically intact Neurovascular intact Sensation intact distally Intact pulses distally Dorsiflexion/Plantar flexion intact Incision: dressing C/D/I No cellulitis present Compartment soft} XR AP&Lat of hip shows well placed\fixed THA  Assessment:   2 Days Post-Op Procedure(s) (LRB): TOTAL HIP ARTHROPLASTY (Right) ADDITIONAL DIAGNOSIS:  Expected Acute Blood Loss Anemia,A fib, NIDDM  Plan: PT/OT WBAT, THA posterior precautions  DVT Prophylaxis: SCDx72 hrs, Coumadin at pt's normal dose  DISCHARGE PLAN: Skilled Nursing Facility/Rehab, [facility] when bed available  DISCHARGE NEEDS: HHPT, Walker and 3-in-1 comode  seat

## 2015-09-05 NOTE — Clinical Social Work Note (Signed)
Clinical Social Work Assessment  Patient Details  Name: Yolanda James MRN: [id number] Date of Birth: February 03, 1931  Date of referral:  09/05/15               Reason for consult:  Facility Placement                Permission sought to share information with:  Facility Sport and exercise psychologist, Family Supports Permission granted to share information::  Yes, Verbal Permission Granted  Name::     Yolanda James, daughter  Agency::  [facility]  Relationship::     Contact Information:     Housing/Transportation Living arrangements for the past 2 months:  Single Family Home Source of Information:  Patient, Adult Children (daughter Yolanda James) Patient Interpreter Needed:  None Criminal Activity/Legal Involvement Pertinent to Current Situation/Hospitalization:  No - Comment as needed Significant Relationships:  Adult Children (Yolanda James daughter) Lives with:  Self Do you feel safe going back to the place where you live?  No Need for family participation in patient care:  Yes (Comment) (patient requests CSW to keep daughter up to date regarding discharge plans)  Care giving concerns:  Daughter is concerned that patient is unable to care for self after surgery and is agreeable to [facility] SNF.   Social Worker assessment / plan:  CSW met with patient at bedside to review PT recommendations of STR/SNF at time of discharge.  Patient explained to CSW that STR at SNF had been set up by her MD office prior to surgery and that she would be discharged to [facility].  CSW confirmed this with [facility].  Patient is a bundle patient and does not need 3 midnights for insurance to cover.  Patient is aware.  Patient is agreeable to SNF and states she wishes for CSW to update her daughter, Yolanda James regarding discharge plans and means of transportation.  Patient has had hip surgery and will be transported via [facility] to SNF.  Patient has clothes at bedside.  Patient's daughter Yolanda James requesting transportation to be called at 2pm.   Daughter gets off work at [employer]) and will go straight to [place] to meet patient.  CSW and RN agreeable to these plans.  SNF is aware and agreeable also.    Employment status:  Retired Forensic scientist:  Art therapist) PT Recommendations:  [place] / Referral to community resources:  [place]  Patient/Family's Response to care:  Patient and daughter are both agreeable to SNF placement.  Patient/Family's Understanding of and Emotional Response to Diagnosis, Current Treatment, and Prognosis:  Patient and daughter are both realistic regarding prognosis and level of care needed at time of discharge.  Emotional Assessment Appearance:  Appears older than stated age Attitude/Demeanor/Rapport:   (appropriate) Affect (typically observed):  Accepting, Adaptable Orientation:  Oriented to Self, Oriented to Place, Oriented to Situation, Oriented to  Time Alcohol / Substance use:  Not Applicable Psych involvement (Current and /or in the community):  No (Comment)  Discharge Needs  Concerns to be addressed:  No discharge needs identified Readmission within the last 30 days:  No Current discharge risk:  None Barriers to Discharge:  No Barriers Identified   Dulcy Fanny, LCSW 09/05/2015, 9:47 AM

## 2015-09-05 NOTE — Clinical Social Work Placement (Signed)
   CLINICAL SOCIAL WORK PLACEMENT  NOTE  Date:  09/05/2015  Patient Details  Name: Yolanda James MRN: [id number] Date of Birth: 10-06-1930  Clinical Social Work is seeking post-discharge placement for this patient at the [place] level of care (*CSW will initial, date and re-position this form in  chart as items are completed):  Yes   Patient/family provided with [place] Work Department's list of facilities offering this level of care within the geographic area requested by the patient (or if unable, by the patient's family).  Yes   Patient/family informed of their freedom to choose among providers that offer the needed level of care, that participate in Medicare, Medicaid or managed care program needed by the patient, have an available bed and are willing to accept the patient.  Yes   Patient/family informed of [facility]'s ownership interest in [facility] and [facility], as well as of the fact that they are under no obligation to receive care at these facilities.  PASRR submitted to EDS on 09/04/15     PASRR number received on 09/04/15     Existing PASRR number confirmed on       FL2 transmitted to all facilities in geographic area requested by pt/family on 09/05/15     FL2 transmitted to all facilities within larger geographic area on       Patient informed that his/her managed care company has contracts with or will negotiate with certain facilities, including the following:        Yes   Patient/family informed of bed offers received.  Patient chooses bed at [facility]     Physician recommends and patient chooses bed at [facility])    Patient to be transferred to [facility] on 09/05/15.  Patient to be transferred to facility by PTAR     Patient family notified on 09/05/15 of transfer.  Name of family member notified:  Darla daughter- will meet patient at SNF after she gets off work approx 3:30p      PHYSICIAN Please prepare priority discharge summary, including medications     Additional Comment:    _______________________________________________ Dulcy Fanny, LCSW 09/05/2015, 9:53 AM

## 2015-09-05 NOTE — Discharge Planning (Signed)
Report called to [facility] at [facility]

## 2015-09-05 NOTE — Discharge Summary (Signed)
Patient ID: Yolanda James MRN: [id number] DOB/AGE: [birth year] 82 y.o.  Admit date: 09/03/2015 Discharge date: 09/05/2015  Admission Diagnoses:  Principal Problem:   Primary osteoarthritis of right hip Active Problems:   Arthritis of right hip   Discharge Diagnoses:  Same  Past Medical History  Diagnosis Date  . Diastolic heart failure (HCC)     associated with AFIB RVR  . A-fib ([place])     post DCCV 02/05/2007  . Pure hypercholesterolemia   . Essential hypertension, benign   . Glaucoma   . Dysrhythmia   . Osteoarthritis     hands and knees, back & knees & "all over"   . Diabetes mellitus without complication ([place])     Surgeries: Procedure(s): TOTAL HIP ARTHROPLASTY on 09/03/2015   Consultants:    Discharged Condition: Improved  Hospital Course: Yolanda James is an 82 y.o. female who was admitted 09/03/2015 for operative treatment ofPrimary osteoarthritis of right hip. Patient has severe unremitting pain that affects sleep, daily activities, and work/hobbies. After pre-op clearance the patient was taken to the operating room on 09/03/2015 and underwent  Procedure(s): TOTAL HIP ARTHROPLASTY.    Patient was given perioperative antibiotics: Anti-infectives    Start     Dose/Rate Route Frequency Ordered Stop   09/03/15 0930  ceFAZolin (ANCEF) 3 g in dextrose 5 % 50 mL IVPB     3 g 130 mL/hr over 30 Minutes Intravenous To [facility] 08/31/15 0955 09/03/15 1013       Patient was given sequential compression devices, early ambulation, and chemoprophylaxis to prevent DVT.  Patient benefited maximally from hospital stay and there were no complications.    Recent vital signs: Patient Vitals for the past 24 hrs:  BP Temp Temp src Pulse Resp SpO2  09/05/15 0352 (!) 97/55 mmHg 97.6 F (36.4 C) Oral 80 16 99 %  09/04/15 1900 99/61 mmHg 97.3 F (36.3 C) Oral 74 16 (!) 72 %  09/04/15 1300 101/68 mmHg 98.1 F (36.7 C) - (!) 129 16 90 %     Recent laboratory studies:   Recent Labs  09/04/15 0637 09/05/15 0555  WBC 12.7* 17.4*  HGB 10.9* 10.2*  HCT 32.9* 32.0*  PLT 195 200  INR 1.41 1.80*     Discharge Medications:     Medication List    TAKE these medications        atorvastatin 80 MG tablet  Commonly known as:  LIPITOR  Take 1 tablet (80 mg total) by mouth daily.     Co Q 10 100 MG Caps  Take 1 capsule by mouth daily.     DILT-XR 180 MG 24 hr capsule  Generic drug:  diltiazem  TAKE 1 CAPSULE BY MOUTH DAILY     dorzolamide-timolol 22.3-6.8 MG/ML ophthalmic solution  Commonly known as:  COSOPT  Place 1 drop into both eyes 2 (two) times daily.     enalapril 5 MG tablet  Commonly known as:  VASOTEC  Take 5 mg by mouth 2 (two) times daily.     Fish Oil 1000 MG Caps  Take 1,000 mg by mouth 2 (two) times daily.     freestyle lancets  every 3 (three) days. Use as directed     FREESTYLE LITE test strip  Generic drug:  glucose blood  every 3 (three) days. Use as directed     furosemide 40 MG tablet  Commonly known as:  LASIX  TAKE 1 TABLET BY MOUTH EVERY DAY  glipiZIDE 2.5 MG 24 hr tablet  Commonly known as:  GLUCOTROL XL  Take 2.5 mg by mouth 2 (two) times daily.     LUMIGAN 0.01 % Soln  Generic drug:  bimatoprost  Place 1 drop into both eyes at bedtime.     metFORMIN 500 MG tablet  Commonly known as:  GLUCOPHAGE  Take 500 mg by mouth 2 (two) times daily with a meal.     oxyCODONE-acetaminophen 5-325 MG tablet  Commonly known as:  ROXICET  Take 1 tablet by mouth every 4 (four) hours as needed.     pilocarpine 4 % ophthalmic solution  Commonly known as:  PILOCAR  Place 1 drop into both eyes 4 (four) times daily.     potassium chloride 10 MEQ CR capsule  Commonly known as:  MICRO-K  TAKE 1 CAPSULE BY MOUTH DAILY     tizanidine 2 MG capsule  Commonly known as:  ZANAFLEX  Take 1 capsule (2 mg total) by mouth 3 (three) times daily.     warfarin 5 MG tablet  Commonly known as:  COUMADIN  TAKE 1 TABLET BY MOUTH  EVERY DAY EXCEPT 1&1/2 TABLET EVERY MONDAYS        Diagnostic Studies: Dg Chest 2 View  08/27/2015  CLINICAL DATA:  Preop right hip replacement EXAM: CHEST  2 VIEW COMPARISON:  None. FINDINGS: Lungs are clear.  No pleural effusion or pneumothorax. Cardiomegaly. Degenerative changes of the visualized thoracolumbar spine. IMPRESSION: No evidence of acute cardiopulmonary disease. Electronically Signed   By: Yolanda James M.D.   On: 08/27/2015 13:22   Dg Pelvis Portable  09/03/2015  CLINICAL DATA:  Post total hip arthroplasty EXAM: PORTABLE PELVIS 1-2 VIEWS COMPARISON:  Portable exam 1333 hours compared to 06/21/2015 FINDINGS: RIGHT hip prosthesis identified new since previous exam. No acute fracture or dislocation. IMPRESSION: RIGHT hip prosthesis without acute complication. Electronically Signed   By: Yolanda James M.D.   On: 09/03/2015 13:43    Disposition:       Discharge Instructions    Call MD / Call 911    Complete by:  As directed   If you experience chest pain or shortness of breath, CALL 911 and be transported to the hospital emergency room.  If you develope a fever above 101 F, pus (white drainage) or increased drainage or redness at the wound, or calf pain, call your surgeon's office.     Constipation Prevention    Complete by:  As directed   Drink plenty of fluids.  Prune juice may be helpful.  You may use a stool softener, such as Colace (over the counter) 100 mg twice a day.  Use MiraLax (over the counter) for constipation as needed.     Diet - low sodium heart healthy    Complete by:  As directed      Driving restrictions    Complete by:  As directed   No driving for 2 weeks     Follow the hip precautions as taught in Physical Therapy    Complete by:  As directed      Increase activity slowly as tolerated    Complete by:  As directed      Patient may shower    Complete by:  As directed   You may shower without a dressing once there is no drainage.  Do not wash over the  wound.  If drainage remains, cover wound with plastic wrap and then shower.  Follow-up Information    Follow up with Yolanda Salen, MD In 2 weeks.   Specialty:  Orthopedic Surgery   Contact information:   [place] [address] [phone]        Signed: Hardin James, Yolanda James R 09/05/2015, 8:00 AM

## 2015-09-06 ENCOUNTER — Non-Acute Institutional Stay (SKILLED_NURSING_FACILITY): Payer: Medicare Other | Admitting: Adult Health

## 2015-09-06 ENCOUNTER — Encounter: Payer: Self-pay | Admitting: Adult Health

## 2015-09-06 DIAGNOSIS — Z7901 Long term (current) use of anticoagulants: Secondary | ICD-10-CM

## 2015-09-06 DIAGNOSIS — H409 Unspecified glaucoma: Secondary | ICD-10-CM

## 2015-09-06 DIAGNOSIS — D62 Acute posthemorrhagic anemia: Secondary | ICD-10-CM

## 2015-09-06 DIAGNOSIS — I4891 Unspecified atrial fibrillation: Secondary | ICD-10-CM

## 2015-09-06 DIAGNOSIS — E119 Type 2 diabetes mellitus without complications: Secondary | ICD-10-CM | POA: Diagnosis not present

## 2015-09-06 DIAGNOSIS — K5901 Slow transit constipation: Secondary | ICD-10-CM

## 2015-09-06 DIAGNOSIS — R600 Localized edema: Secondary | ICD-10-CM | POA: Diagnosis not present

## 2015-09-06 DIAGNOSIS — I10 Essential (primary) hypertension: Secondary | ICD-10-CM

## 2015-09-06 DIAGNOSIS — M1611 Unilateral primary osteoarthritis, right hip: Secondary | ICD-10-CM | POA: Diagnosis not present

## 2015-09-06 DIAGNOSIS — E876 Hypokalemia: Secondary | ICD-10-CM

## 2015-09-06 NOTE — Progress Notes (Signed)
Patient ID: Yolanda James, female   DOB: 02-07-1931, 82 y.o.   MRN: [id number]    DATE:  09/06/2015   MRN:  [id number]  BIRTHDAY: 1931-07-06  Facility:  Nursing Home Location:  [place] Room Number: 605-P  LEVEL OF CARE:  SNF (31)  Contact Information    Name Relation Home Work Mobile   Evans,Darla Daughter [phone]         Code Status History    FULL CODE       Chief Complaint  Patient presents with  . Hospitalization Follow-up    HISTORY OF PRESENT ILLNESS:  This is an 82 year old female who has been admitted to [facility] on 09/05/15 from [facility]. She has PMH of A. fib, pure hyper cholesterolemia, essential hypertension, glaucoma and osteoarthritis. She has osteoarthritis of right hip for which she had right total hip arthroplasty done on 09/03/15. She has been admitted for a short-term rehabilitation.   PAST MEDICAL HISTORY:  Past Medical History  Diagnosis Date  . Diastolic heart failure (HCC)     associated with AFIB RVR  . A-fib ([place])     post DCCV 02/05/2007  . Pure hypercholesterolemia   . Essential hypertension, benign   . Glaucoma   . Dysrhythmia   . Osteoarthritis     hands and knees, back & knees & "all over"   . Diabetes mellitus without complication ([place])      CURRENT MEDICATIONS: Reviewed  Patient's Medications  New Prescriptions   No medications on file  Previous Medications   ATORVASTATIN (LIPITOR) 80 MG TABLET    Take 1 tablet (80 mg total) by mouth daily.   COENZYME Q10 (CO Q 10) 100 MG CAPS    Take 1 capsule by mouth daily.   DILT-XR 180 MG 24 HR CAPSULE    TAKE 1 CAPSULE BY MOUTH DAILY   DORZOLAMIDE-TIMOLOL (COSOPT) 22.3-6.8 MG/ML OPHTHALMIC SOLUTION    Place 1 drop into both eyes 2 (two) times daily.    ENALAPRIL (VASOTEC) 5 MG TABLET    Take 5 mg by mouth 2 (two) times daily.    FREESTYLE LITE TEST STRIP    every 3 (three) days. Reported on 09/06/2015   FUROSEMIDE (LASIX) 40 MG  TABLET    TAKE 1 TABLET BY MOUTH EVERY DAY   GLIPIZIDE (GLUCOTROL XL) 2.5 MG 24 HR TABLET    Take 2.5 mg by mouth 2 (two) times daily.    LANCETS (FREESTYLE) LANCETS    every 3 (three) days. Reported on 09/06/2015   LUMIGAN 0.01 % SOLN    Place 1 drop into both eyes at bedtime.    METFORMIN (GLUCOPHAGE) 500 MG TABLET    Take 500 mg by mouth 2 (two) times daily with a meal.    OMEGA-3 FATTY ACIDS (FISH OIL) 1000 MG CAPS    Take 1,000 mg by mouth 2 (two) times daily.   OXYCODONE-ACETAMINOPHEN (ROXICET) 5-325 MG TABLET    Take 1 tablet by mouth every 4 (four) hours as needed.   PILOCARPINE (PILOCAR) 4 % OPHTHALMIC SOLUTION    Place 1 drop into both eyes 4 (four) times daily.    POTASSIUM CHLORIDE (MICRO-K) 10 MEQ CR CAPSULE    TAKE 1 CAPSULE BY MOUTH DAILY   TIZANIDINE (ZANAFLEX) 2 MG CAPSULE    Take 1 capsule (2 mg total) by mouth 3 (three) times daily.   WARFARIN (COUMADIN) 5 MG TABLET    TAKE 1 TABLET BY  MOUTH EVERY DAY EXCEPT 1&1/2 TABLET EVERY MONDAYS  Modified Medications   No medications on file  Discontinued Medications   No medications on file     No Known Allergies   REVIEW OF SYSTEMS:  GENERAL: no change in appetite, no fatigue, no weight changes, no fever, chills or weakness EYES: Denies change in vision, dry eyes, eye pain, itching or discharge EARS: Denies change in hearing, ringing in ears, or earache NOSE: Denies nasal congestion or epistaxis MOUTH and THROAT: Denies oral discomfort, gingival pain or bleeding, pain from teeth or hoarseness   RESPIRATORY: no cough, SOB, DOE, wheezing, hemoptysis CARDIAC: no chest pain, edema or palpitations GI: no abdominal pain, diarrhea,  heart burn, nausea or vomiting; +constipation GU: Denies dysuria, frequency, hematuria, incontinence, or discharge PSYCHIATRIC: Denies feeling of depression or anxiety. No report of hallucinations, insomnia, paranoia, or agitation   PHYSICAL EXAMINATION  GENERAL APPEARANCE: Well nourished. In no  acute distress. Obese SKIN:  Right hip surgical incision is covered with Aquacel dressing, dry, no erythema  HEAD: Normal in size and contour. No evidence of trauma EYES: Lids open and close normally. No blepharitis, entropion or ectropion. PERRL. Conjunctivae are clear and sclerae are white. Lenses are without opacity EARS: Pinnae are normal. Patient hears normal voice tunes of the examiner MOUTH and THROAT: Lips are without lesions. Oral mucosa is moist and without lesions. Tongue is normal in shape, size, and color and without lesions NECK: supple, trachea midline, no neck masses, no thyroid tenderness, no thyromegaly LYMPHATICS: no LAN in the neck, no supraclavicular LAN RESPIRATORY: breathing is even & unlabored, BS CTAB CARDIAC: Irregularly irregular, no murmur,no extra heart sounds, BLE edema 1+ GI: abdomen soft, normal BS, no masses, no tenderness, no hepatomegaly, no splenomegaly EXTREMITIES:  Able to move 4 extremities PSYCHIATRIC: Alert and oriented X 3. Affect and behavior are appropriate  LABS/RADIOLOGY: Labs reviewed: Basic Metabolic Panel:  Recent Labs  01/12/15 1058 08/27/15 1122  NA 138 139  K 4.0 4.0  CL 101 99*  CO2 33* 25  GLUCOSE 101* 94  BUN 18 15  CREATININE 0.84 0.82  CALCIUM 9.5 9.3   CBC:  Recent Labs  08/27/15 1122 09/04/15 0637 09/05/15 0555  WBC 9.9 12.7* 17.4*  NEUTROABS 6.6  --   --   HGB 12.7 10.9* 10.2*  HCT 40.7 32.9* 32.0*  MCV 86.2 85.7 84.9  PLT 209 195 200   CBG:  Recent Labs  09/05/15 0552 09/05/15 0559 09/05/15 1112  GLUCAP 139* 135* 128*     Dg Chest 2 View  08/27/2015  CLINICAL DATA:  Preop right hip replacement EXAM: CHEST  2 VIEW COMPARISON:  None. FINDINGS: Lungs are clear.  No pleural effusion or pneumothorax. Cardiomegaly. Degenerative changes of the visualized thoracolumbar spine. IMPRESSION: No evidence of acute cardiopulmonary disease. Electronically Signed   By: Julian Hy M.D.   On: 08/27/2015 13:22    Dg Pelvis Portable  09/03/2015  CLINICAL DATA:  Post total hip arthroplasty EXAM: PORTABLE PELVIS 1-2 VIEWS COMPARISON:  Portable exam 1333 hours compared to 06/21/2015 FINDINGS: RIGHT hip prosthesis identified new since previous exam. No acute fracture or dislocation. IMPRESSION: RIGHT hip prosthesis without acute complication. Electronically Signed   By: Lavonia Dana M.D.   On: 09/03/2015 13:43    ASSESSMENT/PLAN:  Osteoarthritis S/P right total hip arthroplasty - for rehabilitation; continue Percocet 5/325 mg 1 tab by mouth every 4 hours when necessary for pain; Zanaflex 2 mg 1 capsule by mouth 3 times a  day for muscle spasm; Coumadin for DVT prophylaxis; follow-up with Dr. Mayer Camel, orthopedic surgeon, in 2 weeks  Hypertension - continue diltiazem 180 mg 24 hour 1 capsule by mouth daily, enalapril 5 mg 1 tab by mouth twice a day and Lasix 40 mg 1 tab by mouth daily  Atrial fibrillation - rate controlled; continue diltiazem 180 mg 24 hour 1 capsule by mouth daily and Coumadin  Long-term use of anticoagulants - INR 1.9; continue Coumadin 7.5 mg by mouth every Mondays and 5 mg daily except Mondays; INR check on 09/11/15  Diabetes mellitus, type II - hemoglobin A1c 5.9; continue glipizide 2.5 mg 24 hour 1 tab by mouth twice a day and metformin 500 mg 1 tab by mouth twice a day; CBG twice a day  Glaucoma - no complaints of eye pain; continue Lumigan, Cosopt and Pilocarpine eyedrops  Hypokalemia - K4.0; continue KCl ER 10 meq 1 capsule by mouth daily  Bilateral lower extremity edema - continue Lasix 40 mg 1 tab by mouth daily; check CMP  Constipation - continue senna S2 tabs by mouth twice a day and MiraLAX 17 g by mouth twice a day  Anemia, acute blood loss - hemoglobin 10.2; check CBC       Goals of care:  Short-term rehabilitation    [facility], NP [facility] [phone]

## 2015-09-07 LAB — BASIC METABOLIC PANEL
BUN: 27 mg/dL — AB (ref 4–21)
CREATININE: 0.9 mg/dL (ref 0.5–1.1)
Glucose: 76 mg/dL
POTASSIUM: 4.3 mmol/L (ref 3.4–5.3)
SODIUM: 143 mmol/L (ref 137–147)

## 2015-09-07 LAB — HEPATIC FUNCTION PANEL
ALK PHOS: 66 U/L (ref 25–125)
ALT: 14 U/L (ref 7–35)
AST: 17 U/L (ref 13–35)
Bilirubin, Total: 1 mg/dL

## 2015-09-07 LAB — CBC AND DIFFERENTIAL
HCT: 31 % — AB (ref 36–46)
HEMOGLOBIN: 10.1 g/dL — AB (ref 12.0–16.0)
Neutrophils Absolute: 6 /uL
Platelets: 219 10*3/uL (ref 150–399)
WBC: 9 10^3/mL

## 2015-09-10 ENCOUNTER — Non-Acute Institutional Stay (SKILLED_NURSING_FACILITY): Payer: Medicare Other | Admitting: Internal Medicine

## 2015-09-10 ENCOUNTER — Encounter: Payer: Self-pay | Admitting: Internal Medicine

## 2015-09-10 DIAGNOSIS — K5900 Constipation, unspecified: Secondary | ICD-10-CM | POA: Diagnosis not present

## 2015-09-10 DIAGNOSIS — D62 Acute posthemorrhagic anemia: Secondary | ICD-10-CM

## 2015-09-10 DIAGNOSIS — R600 Localized edema: Secondary | ICD-10-CM | POA: Diagnosis not present

## 2015-09-10 DIAGNOSIS — E1149 Type 2 diabetes mellitus with other diabetic neurological complication: Secondary | ICD-10-CM

## 2015-09-10 DIAGNOSIS — R2681 Unsteadiness on feet: Secondary | ICD-10-CM | POA: Diagnosis not present

## 2015-09-10 DIAGNOSIS — I4891 Unspecified atrial fibrillation: Secondary | ICD-10-CM

## 2015-09-10 DIAGNOSIS — M1611 Unilateral primary osteoarthritis, right hip: Secondary | ICD-10-CM | POA: Diagnosis not present

## 2015-09-10 DIAGNOSIS — I10 Essential (primary) hypertension: Secondary | ICD-10-CM | POA: Diagnosis not present

## 2015-09-10 NOTE — Progress Notes (Signed)
Patient ID: Yolanda James, female   DOB: April 11, 1931, 82 y.o.   MRN: [id number]    LOCATION: [facility]  PCP: [name], MD   Code Status: Full Code  Goals of care: Advanced Directive information Advanced Directives 08/27/2015  Does patient have an advance directive? Yes  Type of Paramedic of [place];Living will     Extended Emergency Contact Information Primary Emergency Contact: Evans,Darla Address: [address], [place] [address] Johnnette Litter of [place] Phone: [phone] Relation: Daughter Secondary Emergency Contact: [place], [place] [address] Johnnette Litter of [place] Phone: [phone] Relation: Sister   No Known Allergies  Chief Complaint  Patient presents with  . New Admit To SNF    New Admission     HPI:  Patient is a 82 y.o. female seen today for short term rehabilitation post hospital admission with primary right hip OA. She underwent right toal hip arthroplasty on 09/03/15. Her pain is under control with current regimen. She denies any concern this visit. She has PMH of A. fib, OA, HLD, HTN among others.   Review of Systems:  Constitutional: Negative for fever, chills and diaphoresis.  HENT: Negative for headache, congestion, nasal discharge. Eyes: Negative for blurred vision.  Respiratory: Negative for cough, shortness of breath and wheezing.   Cardiovascular: Negative for chest pain, palpitations, leg swelling.  Gastrointestinal: Negative for heartburn, nausea, vomiting, abdominal pain. Had bowel movement this am Genitourinary: Negative for dysuria Musculoskeletal: Negative for fall Skin: Negative for itching, rash.  Neurological: Negative for dizziness. Has neuropathy Psychiatric/Behavioral: Negative for depression   Past Medical History  Diagnosis Date  . Diastolic heart failure (HCC)     associated with AFIB RVR  . A-fib ([place])     post DCCV 02/05/2007  . Pure  hypercholesterolemia   . Essential hypertension, benign   . Glaucoma   . Dysrhythmia   . Osteoarthritis     hands and knees, back & knees & "all over"   . Diabetes mellitus without complication [facility])    Past Surgical History  Procedure Laterality Date  . Ganglion cyst excision Left 1965    wrist  . Eye surgery      laser & cataracts removed & IOL both eyes, glaucoma surgery   . Vaginal births      x3  . Total hip arthroplasty Right 09/03/2015    Procedure: TOTAL HIP ARTHROPLASTY;  Surgeon: Frederik Pear, MD;  Location: [facility];  Service: Orthopedics;  Laterality: Right;   Social History:   reports that she has quit smoking. She has never used smokeless tobacco. She reports that she does not drink alcohol or use illicit drugs.  Family History  Problem Relation Age of Onset  . Arrhythmia Sister     Medications:   Medication List       This list is accurate as of: 09/10/15  2:32 PM.  Always use your most recent med list.               atorvastatin 80 MG tablet  Commonly known as:  LIPITOR  Take 1 tablet (80 mg total) by mouth daily.     Co Q 10 100 MG Caps  Take 1 capsule by mouth daily.     DILT-XR 180 MG 24 hr capsule  Generic drug:  diltiazem  TAKE 1 CAPSULE BY MOUTH DAILY  dorzolamide-timolol 22.3-6.8 MG/ML ophthalmic solution  Commonly known as:  COSOPT  Place 1 drop into both eyes 2 (two) times daily.     enalapril 5 MG tablet  Commonly known as:  VASOTEC  Take 5 mg by mouth 2 (two) times daily.     Fish Oil 1000 MG Caps  Take 1,000 mg by mouth 2 (two) times daily.     freestyle lancets  every 3 (three) days. Reported on 09/06/2015     FREESTYLE LITE test strip  Generic drug:  glucose blood  every 3 (three) days. Reported on 09/06/2015     furosemide 40 MG tablet  Commonly known as:  LASIX  TAKE 1 TABLET BY MOUTH EVERY DAY     glipiZIDE 2.5 MG 24 hr tablet  Commonly known as:  GLUCOTROL XL  Take 2.5 mg by mouth 2 (two) times daily.     LUMIGAN  0.01 % Soln  Generic drug:  bimatoprost  Place 1 drop into both eyes at bedtime.     metFORMIN 500 MG tablet  Commonly known as:  GLUCOPHAGE  Take 500 mg by mouth 2 (two) times daily with a meal.     oxyCODONE-acetaminophen 5-325 MG tablet  Commonly known as:  ROXICET  Take 1 tablet by mouth every 4 (four) hours as needed.     pilocarpine 4 % ophthalmic solution  Commonly known as:  PILOCAR  Place 1 drop into both eyes 4 (four) times daily.     polyethylene glycol packet  Commonly known as:  MIRALAX / GLYCOLAX  Take 17 g by mouth 2 (two) times daily.     potassium chloride 10 MEQ CR capsule  Commonly known as:  MICRO-K  TAKE 1 CAPSULE BY MOUTH DAILY     senna 8.6 MG tablet  Commonly known as:  SENOKOT  Take 1 tablet by mouth 2 (two) times daily.     tizanidine 2 MG capsule  Commonly known as:  ZANAFLEX  Take 1 capsule (2 mg total) by mouth 3 (three) times daily.     warfarin 7.5 MG tablet  Commonly known as:  COUMADIN  Take 7.5 mg by mouth daily. Every Monday for anticoagulation     warfarin 5 MG tablet  Commonly known as:  COUMADIN  Take 5 mg by mouth daily. Except for on Monday for AFIB        Physical Exam: Filed Vitals:   09/10/15 1416  BP: 109/62  Pulse: 88  Temp: 97.9 F (36.6 C)  TempSrc: Oral  Resp: 20  Height: 5\' 4"  (1.626 m)  Weight: 225 lb 6.4 oz (102.241 kg)  SpO2: 97%   Body mass index is 38.67 kg/(m^2).  General- elderly female, obese, in no acute distress Head- normocephalic, atraumatic Nose- no maxillary or frontal sinus tenderness, no nasal discharge Throat- moist mucus membrane Eyes- PERRLA, EOMI, no pallor, no icterus Neck- no cervical lymphadenopathy Cardiovascular- normal s1,s2, no murmur, palpable dorsalis pedis, trace right leg edema Respiratory- bilateral clear to auscultation Abdomen- bowel sounds present, soft, non tender Musculoskeletal- able to move all 4 extremities, limited right hip ROM Neurological- alert and oriented  to person, place and time Skin- warm and dry, right hip surgical incision with dressing in place Psychiatry- normal mood and affect    Labs reviewed: Basic Metabolic Panel:  Recent Labs  01/12/15 1058 08/27/15 08/27/15 1122  NA 138 139 139  K 4.0 4.0 4.0  CL 101  --  99*  CO2 33*  --  25  GLUCOSE 101*  --  94  BUN 18 15 15   CREATININE 0.84 0.8 0.82  CALCIUM 9.5  --  9.3   CBC:  Recent Labs  08/27/15 1122 09/04/15 0637 09/05/15 09/05/15 0555  WBC 9.9 12.7* 17.4 17.4*  NEUTROABS 6.6  --   --   --   HGB 12.7 10.9*  --  10.2*  HCT 40.7 32.9*  --  32.0*  MCV 86.2 85.7  --  84.9  PLT 209 195  --  200   CBG:  Recent Labs  09/05/15 0552 09/05/15 0559 09/05/15 1112  GLUCAP 139* 135* 128*   09/07/15 wbc 9, Hb 10.1, hct 31.4, plt 219   Radiological Exams: Dg Chest 2 View  08/27/2015  CLINICAL DATA:  Preop right hip replacement EXAM: CHEST  2 VIEW COMPARISON:  None. FINDINGS: Lungs are clear.  No pleural effusion or pneumothorax. Cardiomegaly. Degenerative changes of the visualized thoracolumbar spine. IMPRESSION: No evidence of acute cardiopulmonary disease. Electronically Signed   By: Julian Hy M.D.   On: 08/27/2015 13:22   Dg Pelvis Portable  09/03/2015  CLINICAL DATA:  Post total hip arthroplasty EXAM: PORTABLE PELVIS 1-2 VIEWS COMPARISON:  Portable exam 1333 hours compared to 06/21/2015 FINDINGS: RIGHT hip prosthesis identified new since previous exam. No acute fracture or dislocation. IMPRESSION: RIGHT hip prosthesis without acute complication. Electronically Signed   By: Lavonia Dana M.D.   On: 09/03/2015 13:43    Assessment/Plan  Unsteady gait Post right hip surgical repair. Will have patient work with PT/OT as tolerated to regain strength and restore function.  Fall precautions are in place.  Right hip OA S/p hip arthroplasty. Has f.u with orthopedics. Will have her work with physical therapy and occupational therapy team to help with gait training and  muscle strengthening exercises.fall precautions. Skin care. Encourage to be out of bed. Continue percocet 5-325 mg 1 tab q4h prn pain and zanaflex 2 mg tid for muscle spasm. Continue coumadin for DVT prophylaxis. WBAT.  Constipation Change miralax to 17 g daily as needed and change her senokot-s to 2 tab qhs and monitor  Blood loss anemia Post op, low but stable hb, monitor cbc  Hypertension Stable. Continue enalapril and lasix for now, check bp daily  afib Rate controlled. Continue diltiazem 180 mg daily for rate control and coumadin for stroke prophylaxis with goal inr 2-3  Diabetes mellitus 2 With chronic neuropathy to her feet. Continue metformin 500 mg bid and glipizide 2.5 mg bid. Reviewed a1c of 5.9. Monitor cbg.continue statin  Bilateral lower extremity edema  continue Lasix 40 mg daily and kcl supplement. Reviewed bmp   Goals of care: short term rehabilitation   Labs/tests ordered: none  Family/ staff Communication: reviewed care plan with patient and nursing supervisor    Blanchie Serve, MD Internal Medicine [place], [place] [address] Cell Phone (Monday-Friday 8 am - 5 pm): [phone] On Call: [phone] and follow prompts after 5 pm and on weekends Office Phone: [phone] Office Fax: [phone]

## 2015-09-11 ENCOUNTER — Non-Acute Institutional Stay (SKILLED_NURSING_FACILITY): Payer: Medicare Other | Admitting: Adult Health

## 2015-09-11 ENCOUNTER — Encounter: Payer: Self-pay | Admitting: Adult Health

## 2015-09-11 DIAGNOSIS — E119 Type 2 diabetes mellitus without complications: Secondary | ICD-10-CM

## 2015-09-11 DIAGNOSIS — I10 Essential (primary) hypertension: Secondary | ICD-10-CM

## 2015-09-11 DIAGNOSIS — E785 Hyperlipidemia, unspecified: Secondary | ICD-10-CM | POA: Diagnosis not present

## 2015-09-11 DIAGNOSIS — H409 Unspecified glaucoma: Secondary | ICD-10-CM

## 2015-09-11 DIAGNOSIS — R600 Localized edema: Secondary | ICD-10-CM | POA: Diagnosis not present

## 2015-09-11 DIAGNOSIS — M1611 Unilateral primary osteoarthritis, right hip: Secondary | ICD-10-CM | POA: Diagnosis not present

## 2015-09-11 DIAGNOSIS — E876 Hypokalemia: Secondary | ICD-10-CM | POA: Diagnosis not present

## 2015-09-11 DIAGNOSIS — Z7901 Long term (current) use of anticoagulants: Secondary | ICD-10-CM | POA: Diagnosis not present

## 2015-09-11 DIAGNOSIS — K5901 Slow transit constipation: Secondary | ICD-10-CM

## 2015-09-11 DIAGNOSIS — D62 Acute posthemorrhagic anemia: Secondary | ICD-10-CM

## 2015-09-11 DIAGNOSIS — I4891 Unspecified atrial fibrillation: Secondary | ICD-10-CM

## 2015-09-11 NOTE — Progress Notes (Signed)
Patient ID: Yolanda James, female   DOB: 08-Aug-1930, 82 y.o.   MRN: [id number]    DATE:  09/11/2015   MRN:  [id number]  BIRTHDAY: 03-12-1931  Facility:  [facility] Location:  [facility] Room Number: 605-P  LEVEL OF CARE:  SNF (31)  Contact Information    Name Relation Home Work Mobile   Evans,Darla Daughter [phone]     [facility] Sister [phone]         Code Status History    FULL CODE       Chief Complaint  Patient presents with  . Discharge Note    HISTORY OF PRESENT ILLNESS:  This is an 82 year old female who is for discharge home and will have outpatient rehabilitation. DME:  Rolling walker and 3-in-1 bedside commode. She has been admitted to [facility] on 09/05/15 from [facility]. She has PMH of A. fib, pure hyper cholesterolemia, essential hypertension, glaucoma and osteoarthritis. She has osteoarthritis of right hip for which she had right total hip arthroplasty done on 09/03/15.   Patient was admitted to this facility for short-term rehabilitation after the patient's recent hospitalization.  Patient has completed SNF rehabilitation and therapy has cleared the patient for discharge.  PAST MEDICAL HISTORY:  Past Medical History  Diagnosis Date  . Diastolic heart failure (HCC)     associated with AFIB RVR  . A-fib ([place])     post DCCV 02/05/2007  . Pure hypercholesterolemia   . Essential hypertension, benign   . Glaucoma   . Dysrhythmia   . Osteoarthritis     hands and knees, back & knees & "all over"   . Type 2 diabetes mellitus without complication, with long-term current use of insulin ([place])   . Hypokalemia   . Acute blood loss anemia   . Slow transit constipation   . Long term current use of anticoagulant therapy      CURRENT MEDICATIONS: Reviewed    Medication List       This list is accurate as of: 09/11/15  7:04 PM.  Always use your most recent med list.               atorvastatin 80  MG tablet  Commonly known as:  LIPITOR  Take 1 tablet (80 mg total) by mouth daily.     Co Q 10 100 MG Caps  Take 1 capsule by mouth daily.     DILTIAZEM CD 180 MG 24 hr capsule  Generic drug:  diltiazem  Take 180 mg by mouth daily.     dorzolamide-timolol 22.3-6.8 MG/ML ophthalmic solution  Commonly known as:  COSOPT  Place 1 drop into both eyes 2 (two) times daily.     enalapril 5 MG tablet  Commonly known as:  VASOTEC  Take 5 mg by mouth 2 (two) times daily.     Fish Oil 1000 MG Caps  Take 1,000 mg by mouth 2 (two) times daily.     freestyle lancets  every 3 (three) days. Reported on 09/11/2015     FREESTYLE LITE test strip  Generic drug:  glucose blood  every 3 (three) days. Reported on 09/11/2015     furosemide 40 MG tablet  Commonly known as:  LASIX  TAKE 1 TABLET BY MOUTH EVERY DAY     glipiZIDE 2.5 MG 24 hr tablet  Commonly known as:  GLUCOTROL XL  Take 2.5 mg by mouth 2 (two) times daily.     LUMIGAN  0.01 % Soln  Generic drug:  bimatoprost  Place 1 drop into both eyes at bedtime.     metFORMIN 500 MG tablet  Commonly known as:  GLUCOPHAGE  Take 500 mg by mouth 2 (two) times daily with a meal.     oxyCODONE-acetaminophen 5-325 MG tablet  Commonly known as:  ROXICET  Take 1 tablet by mouth every 4 (four) hours as needed.     pilocarpine 4 % ophthalmic solution  Commonly known as:  PILOCAR  Place 1 drop into both eyes 4 (four) times daily.     polyethylene glycol packet  Commonly known as:  MIRALAX / GLYCOLAX  Take 17 g by mouth daily as needed.     potassium chloride 10 MEQ tablet  Commonly known as:  K-DUR  Take 10 mEq by mouth daily.     sennosides-docusate sodium 8.6-50 MG tablet  Commonly known as:  SENOKOT-S  Take 2 tablets by mouth at bedtime.     tizanidine 2 MG capsule  Commonly known as:  ZANAFLEX  Take 1 capsule (2 mg total) by mouth 3 (three) times daily.     warfarin 7.5 MG tablet  Commonly known as:  COUMADIN  Take 7.5 mg by  mouth daily. Every Monday for anticoagulation     warfarin 5 MG tablet  Commonly known as:  COUMADIN  Take 5 mg by mouth daily. Except for on Monday for AFIB         No Known Allergies   REVIEW OF SYSTEMS:  GENERAL: no change in appetite, no fatigue, no weight changes, no fever, chills or weakness EYES: Denies change in vision, dry eyes, eye pain, itching or discharge EARS: Denies change in hearing, ringing in ears, or earache NOSE: Denies nasal congestion or epistaxis MOUTH and THROAT: Denies oral discomfort, gingival pain or bleeding, pain from teeth or hoarseness   RESPIRATORY: no cough, SOB, DOE, wheezing, hemoptysis CARDIAC: no chest pain, edema or palpitations GI: no abdominal pain, diarrhea,  heart burn, nausea or vomiting; +constipation GU: Denies dysuria, frequency, hematuria, incontinence, or discharge PSYCHIATRIC: Denies feeling of depression or anxiety. No report of hallucinations, insomnia, paranoia, or agitation   PHYSICAL EXAMINATION  GENERAL APPEARANCE: Well nourished. In no acute distress. Obese SKIN:  Right hip surgical incision is covered with Aquacel dressing, dry, no erythema  HEAD: Normal in size and contour. No evidence of trauma EYES: Lids open and close normally. No blepharitis, entropion or ectropion. PERRL. Conjunctivae are clear and sclerae are white. Lenses are without opacity EARS: Pinnae are normal. Patient hears normal voice tunes of the examiner MOUTH and THROAT: Lips are without lesions. Oral mucosa is moist and without lesions. Tongue is normal in shape, size, and color and without lesions NECK: supple, trachea midline, no neck masses, no thyroid tenderness, no thyromegaly LYMPHATICS: no LAN in the neck, no supraclavicular LAN RESPIRATORY: breathing is even & unlabored, BS CTAB CARDIAC: Irregularly irregular, no murmur,no extra heart sounds, BLE edema 1+ GI: abdomen soft, normal BS, no masses, no tenderness, no hepatomegaly, no  splenomegaly EXTREMITIES:  Able to move 4 extremities PSYCHIATRIC: Alert and oriented X 3. Affect and behavior are appropriate  LABS/RADIOLOGY: Labs reviewed: Basic Metabolic Panel:  Recent Labs  01/12/15 1058 08/27/15 08/27/15 1122 09/07/15  NA 138 139 139 143  K 4.0 4.0 4.0 4.3  CL 101  --  99*  --   CO2 33*  --  25  --   GLUCOSE 101*  --  94  --  BUN 18 15 15  27*  CREATININE 0.84 0.8 0.82 0.9  CALCIUM 9.5  --  9.3  --    CBC:  Recent Labs  08/27/15 1122 09/04/15 0637 09/05/15 09/05/15 0555 09/07/15  WBC 9.9 12.7* 17.4 17.4* 9.0  NEUTROABS 6.6  --   --   --  6  HGB 12.7 10.9*  --  10.2* 10.1*  HCT 40.7 32.9*  --  32.0* 31*  MCV 86.2 85.7  --  84.9  --   PLT 209 195  --  200 219   CBG:  Recent Labs  09/05/15 0552 09/05/15 0559 09/05/15 1112  GLUCAP 139* 135* 128*     Dg Chest 2 View  08/27/2015  CLINICAL DATA:  Preop right hip replacement EXAM: CHEST  2 VIEW COMPARISON:  None. FINDINGS: Lungs are clear.  No pleural effusion or pneumothorax. Cardiomegaly. Degenerative changes of the visualized thoracolumbar spine. IMPRESSION: No evidence of acute cardiopulmonary disease. Electronically Signed   By: Julian Hy M.D.   On: 08/27/2015 13:22   Dg Pelvis Portable  09/03/2015  CLINICAL DATA:  Post total hip arthroplasty EXAM: PORTABLE PELVIS 1-2 VIEWS COMPARISON:  Portable exam 1333 hours compared to 06/21/2015 FINDINGS: RIGHT hip prosthesis identified new since previous exam. No acute fracture or dislocation. IMPRESSION: RIGHT hip prosthesis without acute complication. Electronically Signed   By: Lavonia Dana M.D.   On: 09/03/2015 13:43    ASSESSMENT/PLAN:  Osteoarthritis S/P right total hip arthroplasty - for outpatient rehabilitation; continue Percocet 5/325 mg 1 tab by mouth every 4 hours when necessary for pain; Zanaflex 2 mg 1 capsule by mouth 3 times a day for muscle spasm; Coumadin for DVT prophylaxis; follow-up with Dr. Mayer Camel, orthopedic  surgeon  Hypertension - continue diltiazem 180 mg 24 hour 1 capsule by mouth daily, enalapril 5 mg 1 tab by mouth twice a day and Lasix 40 mg 1 tab by mouth daily  Atrial fibrillation - rate controlled; continue diltiazem 180 mg 24 hour 1 capsule by mouth daily and Coumadin  Long-term use of anticoagulants - INR 2.7; continue Coumadin 7.5 mg by mouth every Mondays and 5 mg daily except Mondays; INR check on 09/14/15  Diabetes mellitus, type II - hemoglobin A1c 5.9; continue glipizide 2.5 mg 24 hour 1 tab by mouth twice a day and metformin 500 mg 1 tab by mouth twice a day; CBG twice a day  Glaucoma - no complaints of eye pain; continue Lumigan, Cosopt and Pilocarpine eyedrops  Hypokalemia - K4.3; continue KCl ER 10 meq 1 capsule by mouth daily  Bilateral lower extremity edema - continue Lasix 40 mg 1 tab by mouth daily  Constipation - continue senna S2 tabs by mouth Q HS and MiraLAX 17 g by mouth daily PRN  Anemia, acute blood loss - hemoglobin 10.2; re-check hgb 10.1, stable  Hyperlipidemia - continue atorvastatin 80 mg 1 tab by mouth daily     I have filled out patient's discharge paperwork and written prescriptions.  Patient will have outpatient rehabilitation.  DME provided:  Rolling walker and 3-in-1 bedside commode  Total discharge time: Greater than 30 minutes  Discharge time involved coordination of the discharge process with Education officer, museum, nursing staff and therapy department. Medical justification for DME verified.     [facility], NP [employer] [phone]

## 2015-09-12 ENCOUNTER — Other Ambulatory Visit: Payer: Self-pay | Admitting: Interventional Cardiology

## 2015-09-14 ENCOUNTER — Telehealth: Payer: Self-pay | Admitting: Interventional Cardiology

## 2015-09-14 NOTE — Telephone Encounter (Signed)
The pt is advised per Cecilie Kicks, NP ([facility]) to take additional dose of Furosemide tomorrow and that if she sees little to no improvement she may take additional dose on Sunday afternoon and that if she continues to have swelling on Monday to call us back. She verbalized understanding and thanked me for my help.

## 2015-09-14 NOTE — Telephone Encounter (Signed)
New message      Pt c/o swelling: STAT is pt has developed SOB within 24 hours  1. How long have you been experiencing swelling? Pt had hip replacement 2-13.  She came home last wed---feet has been swelling every since she came home 2. Where is the swelling located? Feet and ankles 3.  Are you currently taking a "fluid pill"? yes 4.  Are you currently SOB?  no 5.  Have you traveled recently? no

## 2015-09-25 ENCOUNTER — Ambulatory Visit (INDEPENDENT_AMBULATORY_CARE_PROVIDER_SITE_OTHER): Payer: Medicare Other | Admitting: *Deleted

## 2015-09-25 ENCOUNTER — Telehealth: Payer: Self-pay

## 2015-09-25 DIAGNOSIS — I10 Essential (primary) hypertension: Secondary | ICD-10-CM

## 2015-09-25 DIAGNOSIS — Z5181 Encounter for therapeutic drug level monitoring: Secondary | ICD-10-CM

## 2015-09-25 DIAGNOSIS — I4891 Unspecified atrial fibrillation: Secondary | ICD-10-CM | POA: Diagnosis not present

## 2015-09-25 LAB — POCT INR: INR: 4.1

## 2015-09-25 NOTE — Telephone Encounter (Signed)
**Note De-Identified Siedah Sedor Obfuscation** On 2/24 the pt called the office with c/o swelling in her feet and lower legs after hip surgery on 2/13. At that time the pt was advised per Cecilie Kicks, NP to take additional Lasix dose X 2 days and to call us back if swelling continued.   The pt had a Coumadin clinic apt this am and asked Ivin Booty to send the following message to me:  Margretta Sidle, RN  Deliah Boston Lesbia Ottaway, LPN           Jeani Hawking  Mrs Levene complains of lower leg edema and edema of feet and states the extra dose of Lasix did help but has edema again  Her best phone number to be reached is [phone]  Thanks      Please advise.

## 2015-09-25 NOTE — Telephone Encounter (Addendum)
Per Dr [name] the pt is advised to take additional Lasix and Potassium dose daily X 1 week and to have a BMET drawn on same day as her next Coumadin clinic apt. on 3/17. She verbalized understanding and repeated instructions back to me. BMET ordered and scheduled to be drawn on 3/17.

## 2015-10-05 ENCOUNTER — Ambulatory Visit (INDEPENDENT_AMBULATORY_CARE_PROVIDER_SITE_OTHER): Payer: Medicare Other

## 2015-10-05 ENCOUNTER — Other Ambulatory Visit (INDEPENDENT_AMBULATORY_CARE_PROVIDER_SITE_OTHER): Payer: Medicare Other | Admitting: *Deleted

## 2015-10-05 DIAGNOSIS — Z5181 Encounter for therapeutic drug level monitoring: Secondary | ICD-10-CM

## 2015-10-05 DIAGNOSIS — I10 Essential (primary) hypertension: Secondary | ICD-10-CM | POA: Diagnosis not present

## 2015-10-05 DIAGNOSIS — I4891 Unspecified atrial fibrillation: Secondary | ICD-10-CM

## 2015-10-05 LAB — BASIC METABOLIC PANEL
BUN: 17 mg/dL (ref 7–25)
CALCIUM: 9 mg/dL (ref 8.6–10.4)
CHLORIDE: 99 mmol/L (ref 98–110)
CO2: 30 mmol/L (ref 20–31)
CREATININE: 0.72 mg/dL (ref 0.60–0.88)
GLUCOSE: 69 mg/dL (ref 65–99)
Potassium: 4.3 mmol/L (ref 3.5–5.3)
Sodium: 139 mmol/L (ref 135–146)

## 2015-10-05 LAB — POCT INR: INR: 3.2

## 2015-10-19 ENCOUNTER — Ambulatory Visit (INDEPENDENT_AMBULATORY_CARE_PROVIDER_SITE_OTHER): Payer: Medicare Other | Admitting: Pharmacist

## 2015-10-19 DIAGNOSIS — I4891 Unspecified atrial fibrillation: Secondary | ICD-10-CM

## 2015-10-19 DIAGNOSIS — Z5181 Encounter for therapeutic drug level monitoring: Secondary | ICD-10-CM

## 2015-10-19 LAB — POCT INR: INR: 2.4

## 2015-10-22 ENCOUNTER — Other Ambulatory Visit: Payer: Self-pay

## 2015-10-22 MED ORDER — DILTIAZEM HCL ER COATED BEADS 180 MG PO CP24: 180.0000 mg | ORAL_CAPSULE | Freq: Every day | ORAL | Status: DC

## 2015-11-09 ENCOUNTER — Ambulatory Visit (INDEPENDENT_AMBULATORY_CARE_PROVIDER_SITE_OTHER): Payer: Medicare Other | Admitting: *Deleted

## 2015-11-09 DIAGNOSIS — Z5181 Encounter for therapeutic drug level monitoring: Secondary | ICD-10-CM | POA: Diagnosis not present

## 2015-11-09 DIAGNOSIS — I4891 Unspecified atrial fibrillation: Secondary | ICD-10-CM

## 2015-11-09 LAB — POCT INR: INR: 2.3

## 2015-11-22 ENCOUNTER — Telehealth: Payer: Self-pay | Admitting: Interventional Cardiology

## 2015-11-22 NOTE — Telephone Encounter (Signed)
OK to take an extra half tab of Lasix tomorrow.  Elevate legs.

## 2015-11-22 NOTE — Telephone Encounter (Signed)
New MEssag  Pt wanted to klnow, since recent swelling, if she can inc her Lasix. Pt stated that- RN can leave detailed message w/ answer on vm. Please call back and discuss.    Pt c/o swelling: STAT is pt has developed SOB within 24 hours  1. How long have you been experiencing swelling? 2 weeks  2. Where is the swelling located? Feet ankles, legs  3.  Are you currently taking a "fluid pill"?lasix  4.  Are you currently SOB? No   5.  Have you traveled recently? No

## 2015-11-22 NOTE — Telephone Encounter (Signed)
**Note De-Identified  Obfuscation** The pt c/o swelling in her feet and ankles and she states that her ankles are red. She is unsure of her weight as she does not weigh daily. I have asked her to start weighing herself as soon as she wakes in the mornings after her first urination, to write weight amount down and to keep list of weights so that in the further if her edema returns we will have an idea of how much fluid she has gained She states that she avolds salt and elevates her feet as often as she can.  She wants to know if her Lasix dose needs to be adjusted. Please advise.

## 2015-11-23 NOTE — Telephone Encounter (Addendum)
**Note De-Identified  Obfuscation** The pt is advised and she verbalized understanding. Per the pts request and due to her persistent ankle swelling/redness in her ankles I have scheduled her to see Mare Loan on 5/8 at 2 pm. The pt is in agreement with date and time of apt.

## 2015-11-26 ENCOUNTER — Encounter: Payer: Self-pay | Admitting: Cardiology

## 2015-11-26 ENCOUNTER — Telehealth: Payer: Self-pay | Admitting: *Deleted

## 2015-11-26 ENCOUNTER — Ambulatory Visit (INDEPENDENT_AMBULATORY_CARE_PROVIDER_SITE_OTHER): Payer: Medicare Other | Admitting: Cardiology

## 2015-11-26 VITALS — BP 130/70 | HR 115 | Ht 64.0 in | Wt 223.4 lb

## 2015-11-26 DIAGNOSIS — I10 Essential (primary) hypertension: Secondary | ICD-10-CM | POA: Diagnosis not present

## 2015-11-26 DIAGNOSIS — R600 Localized edema: Secondary | ICD-10-CM | POA: Diagnosis not present

## 2015-11-26 DIAGNOSIS — I4891 Unspecified atrial fibrillation: Secondary | ICD-10-CM | POA: Diagnosis not present

## 2015-11-26 DIAGNOSIS — I5030 Unspecified diastolic (congestive) heart failure: Secondary | ICD-10-CM | POA: Diagnosis not present

## 2015-11-26 DIAGNOSIS — R7989 Other specified abnormal findings of blood chemistry: Secondary | ICD-10-CM

## 2015-11-26 LAB — D-DIMER, QUANTITATIVE: D-Dimer, Quant: 3.18 ug/mL-FEU — ABNORMAL HIGH (ref 0.00–0.50)

## 2015-11-26 MED ORDER — DILTIAZEM HCL ER COATED BEADS 240 MG PO CP24: 240.0000 mg | ORAL_CAPSULE | Freq: Every day | ORAL | Status: AC

## 2015-11-26 NOTE — Patient Instructions (Addendum)
Medication Instructions:  1) DISCONTINUE Diltiazem 180mg  2) START Diltiazem 240mg  once daily  Labwork: BMET, BNP and DDimer today  Testing/Procedures: Your physician has requested that you have an echocardiogram. Echocardiography is a painless test that uses sound waves to create images of your heart. It provides your doctor with information about the size and shape of your heart and how well your heart's chambers and valves are working. This procedure takes approximately one hour. There are no restrictions for this procedure.    Follow-Up: Your physician recommends that you schedule a follow-up appointment in: 2 weeks with Dr. [name] or an extender (preferably Brittainy if any availability.)   Any Other Special Instructions Will Be Listed Below (If Applicable).     If you need a refill on your cardiac medications before your next appointment, please call your pharmacy.

## 2015-11-26 NOTE — Addendum Note (Signed)
Addended by: Eulis Foster on: 11/26/2015 03:08 PM   Modules accepted: Orders

## 2015-11-26 NOTE — Progress Notes (Signed)
11/26/2015 Yolanda James   [birth year]  [id number]  Primary Physician [name], MD Primary Cardiologist: Dr. [name]   Reason for Visit/CC: LEE Swelling and Edema  HPI:  The patient is a 82 y/o female, followed by Dr. [name]. She has a h/o atrial fibrillation, on rate control therapy and chronic anticoagulation with Coumadin. Also with chronic diastolic HF and HTN.   She presents to clinc today with a complaint of persistent bilateral ankle swelling. She called the office on 11/22/15 for medical advice and was instructed to take an extra half tablet of Lasix and to elevated both legs for edema. She has noticed little improvement with theses measures. She notes his all started after her hip surgery 09/03/15. She had total hip replacement on her right side. She reports that she was non weight bearing and was off coumadin for a period of time. She does not recall ever taking lovenox. She denies any severe pain. She just notes that her legs feel tight. She also notes subjective discoloration, erythremia bilaterally.     Current Outpatient Prescriptions  Medication Sig Dispense Refill  . atorvastatin (LIPITOR) 80 MG tablet Take 1 tablet (80 mg total) by mouth daily. 90 tablet 3  . Coenzyme Q10 (CO Q 10) 100 MG CAPS Take 1 capsule by mouth daily.    Marland Kitchen diltiazem (DILTIAZEM CD) 180 MG 24 hr capsule Take 1 capsule (180 mg total) by mouth daily. 90 capsule 0  . dorzolamide-timolol (COSOPT) 22.3-6.8 MG/ML ophthalmic solution Place 1 drop into both eyes 2 (two) times daily.     . enalapril (VASOTEC) 5 MG tablet Take 5 mg by mouth 2 (two) times daily.     Marland Kitchen FREESTYLE LITE test strip every 3 (three) days. Reported on 09/11/2015  0  . furosemide (LASIX) 40 MG tablet TAKE 1 TABLET BY MOUTH EVERY DAY 90 tablet 0  . glipiZIDE (GLUCOTROL XL) 2.5 MG 24 hr tablet Take 2.5 mg by mouth 2 (two) times daily.     . Lancets (FREESTYLE) lancets every 3 (three) days. Reported on 09/11/2015  4  . LUMIGAN 0.01 %  SOLN Place 1 drop into both eyes at bedtime.     . metFORMIN (GLUCOPHAGE) 500 MG tablet Take 500 mg by mouth 2 (two) times daily with a meal.     . Omega-3 Fatty Acids (FISH OIL) 1000 MG CAPS Take 1,000 mg by mouth 2 (two) times daily.    . pilocarpine (PILOCAR) 4 % ophthalmic solution Place 1 drop into both eyes 4 (four) times daily.     . polyethylene glycol (MIRALAX / GLYCOLAX) packet Take 17 g by mouth daily as needed.     . potassium chloride (K-DUR) 10 MEQ tablet Take 10 mEq by mouth daily.    Marland Kitchen warfarin (COUMADIN) 5 MG tablet Take 5 mg by mouth daily. Except for on Monday for AFIB    . warfarin (COUMADIN) 7.5 MG tablet Take 7.5 mg by mouth daily. Every Monday for anticoagulation     No current facility-administered medications for this visit.    No Known Allergies  Social History   Social History  . Marital Status: Widowed    Spouse Name: N/A  . Number of Children: N/A  . Years of Education: N/A   Occupational History  . Not on file.   Social History Main Topics  . Smoking status: Former Research scientist (life sciences)  . Smokeless tobacco: Never Used  . Alcohol Use: No  . Drug Use: No  . Sexual Activity:  Not on file   Other Topics Concern  . Not on file   Social History Narrative     Review of Systems: General: negative for chills, fever, night sweats or weight changes.  Cardiovascular: negative for chest pain, dyspnea on exertion, edema, orthopnea, palpitations, paroxysmal nocturnal dyspnea or shortness of breath Dermatological: negative for rash Respiratory: negative for cough or wheezing Urologic: negative for hematuria Abdominal: negative for nausea, vomiting, diarrhea, bright red blood per rectum, melena, or hematemesis Neurologic: negative for visual changes, syncope, or dizziness All other systems reviewed and are otherwise negative except as noted above.    Height 5\' 4"  (1.626 m).  General appearance: alert, cooperative and no distress Neck: no carotid bruit and no  JVD Lungs: clear to auscultation bilaterally Heart: irregularly irregular rhythm and tachy rate Extremities: tense bilateral LEE Pulses: 2+ and symmetric Skin: warm and dry Neurologic: Grossly normal  EKG Atrial Fibrillation w/ RVR 115 bpm.   ASSESSMENT AND PLAN:   1. Bilateral LEE: ? Acute on chronic diastolic HF vs possible DVTs (patient with recent right hip replacement 2/17, which correlates to the timing of onset. She reports that she was non weight bearing and was off coumadin for a period of time. She does not recall ever taking lovenox). We will check a d-dimer to r/o DVT. If abnormal, she will need bilateral LE venous dopplers. We will also check a BNP for possible acute HF exacerbation. Will check a BMP, in the event that we will need to increase her lasix. Other potential etiology is venous insufficiency (varicose veins present on exam). Patient encouraged to elevated legs for edema. Will hold off on advising compression stockings until DVT has been ruled out. Will check 2D echo to r/o LV systolic dysfunction.   2. Atrial Fibrillation w/ RVR: patient was in afib at time of last OV with Dr. [name] 12/2014. Her rate is poorly controlled today at 115 bpm. She is asymptoamtic, but with possible acute on chronic diastolic HF. We will increase her cardizem from 180 mg to 240 mg for better rate control. Will obtain 2D echo. If reduced LVEF, will need to switch rate control agent from cardizem to BB. Continue Coumadin for a/c.  PLAN  F/u in 1-2 weeks for repeat EKG and reassessment of LEE.   Lyda Jester PA-C 11/26/2015 1:54 PM

## 2015-11-26 NOTE — Telephone Encounter (Signed)
Reviewed with Lyda Jester, PA-C and she said to have pt have bilateral LE dopplers. Spoke with pt and informed her of new orders. Pt verbalized understanding and was in agreement with this plan.

## 2015-11-27 LAB — BASIC METABOLIC PANEL
BUN: 21 mg/dL (ref 7–25)
CALCIUM: 9.3 mg/dL (ref 8.6–10.4)
CO2: 31 mmol/L (ref 20–31)
Chloride: 102 mmol/L (ref 98–110)
Creat: 0.81 mg/dL (ref 0.60–0.88)
Glucose, Bld: 102 mg/dL — ABNORMAL HIGH (ref 65–99)
Potassium: 5 mmol/L (ref 3.5–5.3)
Sodium: 142 mmol/L (ref 135–146)

## 2015-11-27 LAB — BRAIN NATRIURETIC PEPTIDE: Brain Natriuretic Peptide: 95 pg/mL (ref <100)

## 2015-11-28 ENCOUNTER — Ambulatory Visit (HOSPITAL_COMMUNITY)
Admission: RE | Admit: 2015-11-28 | Discharge: 2015-11-28 | Disposition: A | Discharge status: Home / Self Care | Payer: Medicare Other | Source: Ambulatory Visit | Attending: Cardiovascular Disease | Admitting: Cardiovascular Disease

## 2015-11-28 DIAGNOSIS — R600 Localized edema: Secondary | ICD-10-CM

## 2015-11-28 DIAGNOSIS — E119 Type 2 diabetes mellitus without complications: Secondary | ICD-10-CM | POA: Insufficient documentation

## 2015-11-28 DIAGNOSIS — I5030 Unspecified diastolic (congestive) heart failure: Secondary | ICD-10-CM | POA: Diagnosis not present

## 2015-11-28 DIAGNOSIS — R7989 Other specified abnormal findings of blood chemistry: Secondary | ICD-10-CM

## 2015-11-28 DIAGNOSIS — Z7901 Long term (current) use of anticoagulants: Secondary | ICD-10-CM | POA: Diagnosis not present

## 2015-11-28 DIAGNOSIS — I110 Hypertensive heart disease with heart failure: Secondary | ICD-10-CM | POA: Diagnosis not present

## 2015-11-28 DIAGNOSIS — R791 Abnormal coagulation profile: Secondary | ICD-10-CM | POA: Diagnosis not present

## 2015-11-28 DIAGNOSIS — E7800 Pure hypercholesterolemia, unspecified: Secondary | ICD-10-CM | POA: Insufficient documentation

## 2015-12-01 ENCOUNTER — Other Ambulatory Visit: Payer: Self-pay | Admitting: Interventional Cardiology

## 2015-12-03 ENCOUNTER — Encounter (HOSPITAL_COMMUNITY): Payer: Medicare Other

## 2015-12-07 ENCOUNTER — Ambulatory Visit (INDEPENDENT_AMBULATORY_CARE_PROVIDER_SITE_OTHER): Payer: Medicare Other | Admitting: *Deleted

## 2015-12-07 DIAGNOSIS — I4891 Unspecified atrial fibrillation: Secondary | ICD-10-CM | POA: Diagnosis not present

## 2015-12-07 DIAGNOSIS — Z5181 Encounter for therapeutic drug level monitoring: Secondary | ICD-10-CM

## 2015-12-07 LAB — POCT INR: INR: 1.9

## 2015-12-13 ENCOUNTER — Encounter: Payer: Self-pay | Admitting: Cardiology

## 2015-12-13 ENCOUNTER — Ambulatory Visit (HOSPITAL_COMMUNITY): Payer: Medicare Other | Attending: Cardiovascular Disease

## 2015-12-13 ENCOUNTER — Ambulatory Visit (INDEPENDENT_AMBULATORY_CARE_PROVIDER_SITE_OTHER): Payer: Medicare Other | Admitting: Cardiology

## 2015-12-13 ENCOUNTER — Other Ambulatory Visit: Payer: Self-pay

## 2015-12-13 VITALS — BP 116/68 | HR 66 | Ht 66.0 in | Wt 218.8 lb

## 2015-12-13 DIAGNOSIS — R600 Localized edema: Secondary | ICD-10-CM

## 2015-12-13 DIAGNOSIS — I5030 Unspecified diastolic (congestive) heart failure: Secondary | ICD-10-CM

## 2015-12-13 DIAGNOSIS — I509 Heart failure, unspecified: Secondary | ICD-10-CM | POA: Diagnosis present

## 2015-12-13 DIAGNOSIS — I071 Rheumatic tricuspid insufficiency: Secondary | ICD-10-CM | POA: Insufficient documentation

## 2015-12-13 DIAGNOSIS — I110 Hypertensive heart disease with heart failure: Secondary | ICD-10-CM | POA: Diagnosis not present

## 2015-12-13 DIAGNOSIS — I10 Essential (primary) hypertension: Secondary | ICD-10-CM

## 2015-12-13 DIAGNOSIS — I4891 Unspecified atrial fibrillation: Secondary | ICD-10-CM

## 2015-12-13 NOTE — Progress Notes (Signed)
12/13/2015 Yolanda James   July 27, 1930  [id number]  Primary Physician Yolanda Hams, MD Primary Cardiologist: Dr. Irish James   Reason for Visit/CC: Two-week follow-up for lower extremity swelling and edema.  HPI:  The patient is a 82 y/o female, followed by Dr. Irish James. She has a h/o atrial fibrillation, on rate control therapy and chronic anticoagulation with Coumadin. Also with chronic diastolic HF and HTN. I evaluated her in clinic 2 weeks ago on 11/26/2015. She presented with a complaint of persistent bilateral ankle swelling. She called the office on 11/22/15 for medical advice and was instructed to take an extra half tablet of Lasix and to elevated both legs for edema. She noticed little improvement with those measures. She reported to me that her symptoms had started shortly after her hip surgery which was performed 09/03/15. She had total hip replacement on her right side. She reported that she was non weight bearing and was off coumadin for a period of time. She also noted that she had difficulties regulating her INR following her surgery. She did not recall ever taking Lovenox. She denied any severe pain but noted that her legs just felt tight. She had also noted subjective discoloration with the erythema bilaterally.  Based on my assessment, I felt that potential etiology was either acute on chronic diastolic heart failure versus possible DVTs as the patient had noted recent hip replacement, off Coumadin with difficulties regulating her INR post surgery. I ordered a d-dimer. I was elevated at 3.18. Subsequently I ordered bilateral lower extremity venous Dopplers to rule out DVT. Doppler studies were negative. There was no evidence of lower extremity deep or superficial venous thrombus or incompetence in the visualized vessels, bilaterally. I also ordered a BNP to rule out the possibility of  acute on chronic diastolic heart failure. BNP was normal at 95.0.    During her last office visit,  EKG was also obtained which revealed afib with RVR. Her rate was 115 bpm. I elected to increase her Cardizem dose to 240 mg daily. I also recommended that we obtain a 2-D echocardiogram given concerns for worsening heart failure and rapid AF. Unfortunately results are not available at this point. She just had her echo done earlier today. We will follow-up on these results. If EF is low, we will need to change her rate control agent for Cardizem to a BB.  Today in clinic, she reports some improvement in her edema. She denies any dyspnea. Her HR rate is better controlled today at 66 bpm. Her blood pressure is stable and well-controlled at 116/68. Her weight at home has remained stable between 220-222 lb on her home scales. Office weigh tis 218 lb.      Current Outpatient Prescriptions  Medication Sig Dispense Refill  . atorvastatin (LIPITOR) 80 MG tablet Take 1 tablet (80 mg total) by mouth daily. 90 tablet 3  . Coenzyme Q10 (CO Q 10) 100 MG CAPS Take 1 capsule by mouth daily.    Marland Kitchen diltiazem (CARDIZEM CD) 240 MG 24 hr capsule Take 1 capsule (240 mg total) by mouth daily. 90 capsule 3  . dorzolamide-timolol (COSOPT) 22.3-6.8 MG/ML ophthalmic solution Place 1 drop into both eyes 2 (two) times daily.     . enalapril (VASOTEC) 5 MG tablet Take 5 mg by mouth 2 (two) times daily.     Marland Kitchen FREESTYLE LITE test strip every 3 (three) days. Reported on 09/11/2015  0  . furosemide (LASIX) 40 MG tablet TAKE 1 TABLET BY MOUTH EVERY  DAY 90 tablet 3  . glipiZIDE (GLUCOTROL XL) 2.5 MG 24 hr tablet Take 2.5 mg by mouth 2 (two) times daily.     . Lancets (FREESTYLE) lancets every 3 (three) days. Reported on 09/11/2015  4  . LUMIGAN 0.01 % SOLN Place 1 drop into both eyes at bedtime.     . metFORMIN (GLUCOPHAGE) 500 MG tablet Take 500 mg by mouth 2 (two) times daily with a meal.     . Omega-3 Fatty Acids (FISH OIL) 1000 MG CAPS Take 1,000 mg by mouth 2 (two) times daily.    . pilocarpine (PILOCAR) 4 % ophthalmic solution  Place 1 drop into both eyes 4 (four) times daily.     . polyethylene glycol (MIRALAX / GLYCOLAX) packet Take 17 g by mouth daily as needed.     . potassium chloride (K-DUR) 10 MEQ tablet Take 10 mEq by mouth daily.    . potassium chloride (MICRO-K) 10 MEQ CR capsule Take 1 capsule by mouth daily.  1  . warfarin (COUMADIN) 5 MG tablet Take 5 mg by mouth daily. Except for on Monday for AFIB     No current facility-administered medications for this visit.    No Known Allergies  Social History   Social History  . Marital Status: Widowed    Spouse Name: N/A  . Number of Children: N/A  . Years of Education: N/A   Occupational History  . Not on file.   Social History Main Topics  . Smoking status: Former Research scientist (life sciences)  . Smokeless tobacco: Never Used  . Alcohol Use: No  . Drug Use: No  . Sexual Activity: Not on file   Other Topics Concern  . Not on file   Social History Narrative     Review of Systems: General: negative for chills, fever, night sweats or weight changes.  Cardiovascular: negative for chest pain, dyspnea on exertion, edema, orthopnea, palpitations, paroxysmal nocturnal dyspnea or shortness of breath Dermatological: negative for rash Respiratory: negative for cough or wheezing Urologic: negative for hematuria Abdominal: negative for nausea, vomiting, diarrhea, bright red blood per rectum, melena, or hematemesis Neurologic: negative for visual changes, syncope, or dizziness All other systems reviewed and are otherwise negative except as noted above.    Blood pressure 116/68, pulse 66, height 5\' 6"  (1.676 m), weight 218 lb 12.8 oz (99.247 kg), SpO2 92 %.  General appearance: alert, cooperative and no distress Neck: no carotid bruit and no JVD Lungs: clear to auscultation bilaterally Heart: regular rate and rhythm, S1, S2 normal, no murmur, click, rub or gallop Extremities: chronic mild LEE, non pitting Pulses: 2+ and symmetric Skin: warm and dry Neurologic:  Grossly normal  EKG not performed.   ASSESSMENT AND PLAN:   1. Bilateral LEE: d-dimer elevated at 3.19, however bilateral venous dopplers are negative for DVT. She denies dyspnea. BNP was normal at 95. 2D echo pending to assess LVEF. Weight is stable. ? Venous insufficiency. I recommended elevation and compression stockings. Continue lasix, 40 mg daily.  2. Atrial Fibrillation: HR is better controlled with increase in Cardizem to 240 mg. HR is down from 115 bpm at last OV to 66 bpm today. BP is stable at 116/68. She is on Coumadin for a/c.   PLAN  Keep yearly f/u with Dr. Irish James in 2 months  Brittainy Simmons PA-C 12/13/2015 12:00 PM

## 2015-12-13 NOTE — Patient Instructions (Signed)
Medication Instructions:  Your physician recommends that you continue on your current medications as directed. Please refer to the Current Medication list given to you today.   Labwork: None ordered  Testing/Procedures: None ordered  Follow-Up: Follow up as planned with Dr.Varanasi in July 2017  Any Other Special Instructions Will Be Listed Below (If Applicable).     If you need a refill on your cardiac medications before your next appointment, please call your pharmacy.

## 2015-12-28 ENCOUNTER — Ambulatory Visit (INDEPENDENT_AMBULATORY_CARE_PROVIDER_SITE_OTHER): Payer: Medicare Other | Admitting: *Deleted

## 2015-12-28 DIAGNOSIS — Z5181 Encounter for therapeutic drug level monitoring: Secondary | ICD-10-CM

## 2015-12-28 DIAGNOSIS — I4891 Unspecified atrial fibrillation: Secondary | ICD-10-CM

## 2015-12-28 LAB — POCT INR: INR: 1.7

## 2016-01-11 ENCOUNTER — Ambulatory Visit (INDEPENDENT_AMBULATORY_CARE_PROVIDER_SITE_OTHER): Payer: Medicare Other

## 2016-01-11 DIAGNOSIS — I4891 Unspecified atrial fibrillation: Secondary | ICD-10-CM

## 2016-01-11 DIAGNOSIS — Z5181 Encounter for therapeutic drug level monitoring: Secondary | ICD-10-CM

## 2016-01-11 LAB — POCT INR: INR: 2

## 2016-01-29 ENCOUNTER — Other Ambulatory Visit: Payer: Self-pay | Admitting: Interventional Cardiology

## 2016-02-05 ENCOUNTER — Ambulatory Visit (INDEPENDENT_AMBULATORY_CARE_PROVIDER_SITE_OTHER): Payer: Medicare Other | Admitting: Interventional Cardiology

## 2016-02-05 ENCOUNTER — Ambulatory Visit (INDEPENDENT_AMBULATORY_CARE_PROVIDER_SITE_OTHER): Payer: Medicare Other

## 2016-02-05 ENCOUNTER — Encounter (INDEPENDENT_AMBULATORY_CARE_PROVIDER_SITE_OTHER): Payer: Self-pay

## 2016-02-05 ENCOUNTER — Encounter: Payer: Self-pay | Admitting: Interventional Cardiology

## 2016-02-05 VITALS — BP 105/65 | HR 101 | Ht 66.0 in | Wt 219.0 lb

## 2016-02-05 DIAGNOSIS — E785 Hyperlipidemia, unspecified: Secondary | ICD-10-CM

## 2016-02-05 DIAGNOSIS — Z5181 Encounter for therapeutic drug level monitoring: Secondary | ICD-10-CM

## 2016-02-05 DIAGNOSIS — I482 Chronic atrial fibrillation, unspecified: Secondary | ICD-10-CM

## 2016-02-05 DIAGNOSIS — I10 Essential (primary) hypertension: Secondary | ICD-10-CM | POA: Diagnosis not present

## 2016-02-05 DIAGNOSIS — I4891 Unspecified atrial fibrillation: Secondary | ICD-10-CM | POA: Diagnosis not present

## 2016-02-05 DIAGNOSIS — R600 Localized edema: Secondary | ICD-10-CM | POA: Diagnosis not present

## 2016-02-05 LAB — POCT INR: INR: 2.4

## 2016-02-05 MED ORDER — FUROSEMIDE 40 MG PO TABS: ORAL_TABLET | ORAL | Status: AC

## 2016-02-05 NOTE — Patient Instructions (Signed)
**Note De-Identified  Obfuscation** Medication Instructions:  Same-no changes You may take 1 additional Furosemide dose a week as needed for sweliing  Labwork: None  Testing/Procedures: None  Follow-Up: Your physician wants you to follow-up in: 1 year. You will receive a reminder letter in the mail two months in advance. If you don't receive a letter, please call our office to schedule the follow-up appointment.     If you need a refill on your cardiac medications before your next appointment, please call your pharmacy.

## 2016-02-05 NOTE — Progress Notes (Signed)
Patient ID: Yolanda James, female   DOB: Oct 02, 1930, 82 y.o.   MRN: [id number]     Cardiology Office Note   Date:  02/05/2016   ID:  Yolanda James, DOB 1931-02-25, MRN [id number]  PCP:  [name], MD    No chief complaint on file. f/u AFib   Wt Readings from Last 3 Encounters:  02/05/16 219 lb (99.338 kg)  12/13/15 218 lb 12.8 oz (99.247 kg)  11/26/15 223 lb 6.4 oz (101.334 kg)       History of Present Illness: Yolanda James is a 82 y.o. female  with AFib and hyperlipidemia. Sotalol was stopped in 2012. She is rate controled and anticoagulated. Most BP Readings at home in the 123456 systolic range. HR at home in the 70-90s range typically. Atrial Fibrillation F/U:  lost weight with portion control. Husband died in Oct 12, 2011. Denies : Chest pain.  Dizziness.  Orthopnea.  Palpitations.  Shortness of breath.  Syncope.  Since last visit she No bleeding. No falls. Reports mild swelling of BLE if she doesn't elevate feet. Has not been walking secondary to arthritis. She has not been riding the bike as much recently- not since her hip replacememnt. She does walk regularly.   Blood sugar has been well controlled.  HR at home is in the 90s typically.  She has not felt palpitations.  She finds that the leg swelling is worse at the end of the day.  She elevated her legs and reduces sodium intake.      Past Medical History  Diagnosis Date  . Diastolic heart failure (HCC)     associated with AFIB RVR  . A-fib ([place])     post DCCV 02/05/2007  . Pure hypercholesterolemia   . Essential hypertension, benign   . Glaucoma   . Dysrhythmia   . Osteoarthritis     hands and knees, back & knees & "all over"   . Type 2 diabetes mellitus without complication, with long-term current use of insulin ([place])   . Hypokalemia   . Acute blood loss anemia   . Slow transit constipation   . Long term current use of anticoagulant therapy     Past Surgical History  Procedure Laterality  Date  . Ganglion cyst excision Left 1965    wrist  . Eye surgery      laser & cataracts removed & IOL both eyes, glaucoma surgery   . Vaginal births      x3  . Total hip arthroplasty Right 09/03/2015    Procedure: TOTAL HIP ARTHROPLASTY;  Surgeon: Frederik Pear, MD;  Location: [facility];  Service: Orthopedics;  Laterality: Right;     Current Outpatient Prescriptions  Medication Sig Dispense Refill  . atorvastatin (LIPITOR) 80 MG tablet Take 1 tablet (80 mg total) by mouth daily. 90 tablet 3  . Coenzyme Q10 (CO Q 10) 100 MG CAPS Take 1 capsule by mouth daily.    Marland Kitchen diltiazem (CARDIZEM CD) 240 MG 24 hr capsule Take 1 capsule (240 mg total) by mouth daily. 90 capsule 3  . dorzolamide-timolol (COSOPT) 22.3-6.8 MG/ML ophthalmic solution Place 1 drop into both eyes 2 (two) times daily.     . enalapril (VASOTEC) 5 MG tablet Take 5 mg by mouth 2 (two) times daily.     Marland Kitchen FREESTYLE LITE test strip every 3 (three) days. Reported on 09/11/2015  0  . furosemide (LASIX) 40 MG tablet TAKE 1 TABLET BY MOUTH EVERY DAY 90 tablet 3  .  glipiZIDE (GLUCOTROL XL) 2.5 MG 24 hr tablet Take 2.5 mg by mouth 2 (two) times daily.     . Lancets (FREESTYLE) lancets every 3 (three) days. Reported on 09/11/2015  4  . LUMIGAN 0.01 % SOLN Place 1 drop into both eyes at bedtime.     . metFORMIN (GLUCOPHAGE) 500 MG tablet Take 500 mg by mouth 2 (two) times daily with a meal.     . Omega-3 Fatty Acids (FISH OIL) 1000 MG CAPS Take 1,000 mg by mouth 2 (two) times daily.    . pilocarpine (PILOCAR) 4 % ophthalmic solution Place 1 drop into both eyes 4 (four) times daily.     . polyethylene glycol (MIRALAX / GLYCOLAX) packet Take 17 g by mouth daily as needed.     . potassium chloride (MICRO-K) 10 MEQ CR capsule Take 10 mEq by mouth daily.   1  . warfarin (COUMADIN) 5 MG tablet TAKE 1 TABLET BY MOUTH AS DIRECTED 120 tablet 1   No current facility-administered medications for this visit.    Allergies:   Review of patient's allergies  indicates no known allergies.    Social History:  The patient  reports that she has quit smoking. She has never used smokeless tobacco. She reports that she does not drink alcohol or use illicit drugs.   Family History:  The patient's family history includes Arrhythmia in her sister; Heart attack in her sister.    ROS:  Please see the history of present illness.   Otherwise, review of systems are positive for joint pains.   All other systems are reviewed and negative.    PHYSICAL EXAM: VS:  BP 105/65 mmHg  Pulse 101  Ht 5\' 6"  (1.676 m)  Wt 219 lb (99.338 kg)  BMI 35.36 kg/m2  SpO2 95% , BMI Body mass index is 35.36 kg/(m^2). GEN: Well nourished, well developed, in no acute distress HEENT: normal Neck: no JVD, carotid bruits, or masses Cardiac: irregularly irregular; no murmurs, rubs, or gallops,; bilateral LE edema  Respiratory:  clear to auscultation bilaterally, normal work of breathing GI: soft, nontender, nondistended, + BS MS: no deformity or atrophy Skin: warm and dry, no rash Neuro:  Strength and sensation are intact Psych: euthymic mood, full affect   EKG:   The ekg ordered today demonstrates AFib, rate controlled   Recent Labs: 09/07/2015: ALT 14; Hemoglobin 10.1*; Platelets 219 11/26/2015: Brain Natriuretic Peptide 95.0; BUN 21; Creat 0.81; Potassium 5.0; Sodium 142   Lipid Panel    Component Value Date/Time   CHOL 136 09/19/2013 0955   TRIG 118 09/19/2013 0955   HDL 51 09/19/2013 0955   LDLCALC 61 09/19/2013 0955     Other studies Reviewed: Additional studies/ records that were reviewed today with results demonstrating: echo in 5/17 showed normal LV function.  No AS, or MR of significance.   ASSESSMENT AND PLAN:  1. AFib: Rate controlled.  COntinue Dilt for rate control.  Coumadin for stroke prevention. INR 2.4 today.  No bleeding issues.  She is steady on her feet.  DOing well post hip replacement.  2. HTN: Controlled at home.  COntinue current meds.  COntinue current BP meds.  3. Hyperlipidemia:  Last LDL in our system from 3/15: LDL 61. Checked with Dr. Delfina Redwood more recently.  WIll get those records.   4. Edema:  She continues diuretics.  She can take an extra Lasix up to once a week if she has more edema. She will take an extra potassium at  thise times as well.  Elevate legs as much as possible.  If she has to do this frequently, will change prescription to BID prn dosing.   Current medicines are reviewed at length with the patient today.  The patient concerns regarding her medicines were addressed.  The following changes have been made:  No change  Labs/ tests ordered today include:   No orders of the defined types were placed in this encounter.    Recommend 150 minutes/week of aerobic exercise Low fat, low carb, high fiber diet recommended  Disposition:   FU in 1 year   Signed, Larae Grooms, MD  02/05/2016 11:26 AM    [facility] [address], [place], [place]  [address] Phone: [phone]; Fax: [phone]

## 2016-03-01 ENCOUNTER — Other Ambulatory Visit: Payer: Self-pay | Admitting: Interventional Cardiology

## 2016-03-03 ENCOUNTER — Ambulatory Visit (INDEPENDENT_AMBULATORY_CARE_PROVIDER_SITE_OTHER): Payer: Medicare Other | Admitting: *Deleted

## 2016-03-03 DIAGNOSIS — I4891 Unspecified atrial fibrillation: Secondary | ICD-10-CM

## 2016-03-03 DIAGNOSIS — Z5181 Encounter for therapeutic drug level monitoring: Secondary | ICD-10-CM

## 2016-03-03 LAB — POCT INR: INR: 2.4

## 2016-04-07 ENCOUNTER — Ambulatory Visit (INDEPENDENT_AMBULATORY_CARE_PROVIDER_SITE_OTHER): Payer: Medicare Other | Admitting: *Deleted

## 2016-04-07 ENCOUNTER — Ambulatory Visit (INDEPENDENT_AMBULATORY_CARE_PROVIDER_SITE_OTHER): Payer: Medicare Other | Admitting: Interventional Cardiology

## 2016-04-07 ENCOUNTER — Encounter: Payer: Self-pay | Admitting: Interventional Cardiology

## 2016-04-07 VITALS — BP 120/62 | HR 67 | Ht 66.0 in | Wt 219.0 lb

## 2016-04-07 DIAGNOSIS — I10 Essential (primary) hypertension: Secondary | ICD-10-CM | POA: Diagnosis not present

## 2016-04-07 DIAGNOSIS — I4891 Unspecified atrial fibrillation: Secondary | ICD-10-CM | POA: Diagnosis not present

## 2016-04-07 DIAGNOSIS — Z5181 Encounter for therapeutic drug level monitoring: Secondary | ICD-10-CM

## 2016-04-07 LAB — POCT INR: INR: 3.6

## 2016-04-07 MED ORDER — METOPROLOL TARTRATE 25 MG PO TABS: 25.0000 mg | ORAL_TABLET | Freq: Two times a day (BID) | ORAL | 3 refills | Status: DC

## 2016-04-07 NOTE — Progress Notes (Signed)
Patient ID: Yolanda James, female   DOB: 1931/03/18, 82 y.o.   MRN: [id number]     Cardiology Office Note   Date:  04/07/2016   ID:  Yolanda James, DOB Dec 04, 1930, MRN [id number]  PCP:  Yolanda Hams, MD    No chief complaint on file. f/u AFib   Wt Readings from Last 3 Encounters:  04/07/16 219 lb (99.3 kg)  02/05/16 219 lb (99.3 kg)  12/13/15 218 lb 12.8 oz (99.2 kg)       History of Present Illness: Yolanda James is a 82 y.o. female  with AFib and hyperlipidemia. Sotalol was stopped in 2012. She is rate controlled and anticoagulated. Most BP Readings at home in the [id number] systolic range. HR at home in the 70-90s range typically.  Over the last few weeks, HR have been higher at times. Atrial Fibrillation F/U:  lost weight with portion control. Husband died in 10/19/11. Denies : Chest pain.  Dizziness.  Orthopnea.  Palpitations.  Shortness of breath.  Syncope.  Since last visit she No bleeding. No falls. Reports mild swelling of BLE if she doesn't elevate feet. Has not been walking secondary to arthritis. She has not been riding the bike as much recently- not since her hip replacememnt. She does walk regularly.   Blood sugar has been well controlled.  HR at home is in the 90s typically.  She has not felt palpitations.  She finds that the leg swelling is worse at the end of the day.  She elevated her legs and reduces sodium intake.    Since the last visit, she felt that she has been bloated.  She has tied to decrease portion sizes.  She is concerned about the variability of her HR.    HR wqas around 140 in the Coumadin clinic this AM.    Past Medical History:  Diagnosis Date  . A-fib ([place])    post DCCV 02/05/2007  . Acute blood loss anemia   . Diastolic heart failure (HCC)    associated with AFIB RVR  . Dysrhythmia   . Essential hypertension, benign   . Glaucoma   . Hypokalemia   . Long term current use of anticoagulant therapy   . Osteoarthritis    hands and knees, back & knees & "all over"   . Pure hypercholesterolemia   . Slow transit constipation   . Type 2 diabetes mellitus without complication, with long-term current use of insulin [facility])     Past Surgical History:  Procedure Laterality Date  . EYE SURGERY     laser & cataracts removed & IOL both eyes, glaucoma surgery   . GANGLION CYST EXCISION Left 1965   wrist  . TOTAL HIP ARTHROPLASTY Right 09/03/2015   Procedure: TOTAL HIP ARTHROPLASTY;  Surgeon: Frederik Pear, MD;  Location: [facility];  Service: Orthopedics;  Laterality: Right;  . vaginal births     x3     Current Outpatient Prescriptions  Medication Sig Dispense Refill  . atorvastatin (LIPITOR) 80 MG tablet Take 1 tablet (80 mg total) by mouth daily. 90 tablet 3  . Coenzyme Q10 (CO Q 10) 100 MG CAPS Take 1 capsule by mouth daily.    Marland Kitchen diltiazem (CARDIZEM CD) 240 MG 24 hr capsule Take 1 capsule (240 mg total) by mouth daily. 90 capsule 3  . dorzolamide (TRUSOPT) 2 % ophthalmic solution Place 1 drop into both eyes 2 (two) times daily.     . dorzolamide-timolol (COSOPT) 22.3-6.8 MG/ML ophthalmic solution  Place 1 drop into both eyes 2 (two) times daily.     . enalapril (VASOTEC) 5 MG tablet Take 5 mg by mouth 2 (two) times daily.     Marland Kitchen FREESTYLE LITE test strip every 3 (three) days. Reported on 09/11/2015  0  . furosemide (LASIX) 40 MG tablet Take 1 tablet daily. May take additional dose once weekly for swelling. 105 tablet 3  . glipiZIDE (GLUCOTROL XL) 2.5 MG 24 hr tablet Take 2.5 mg by mouth 2 (two) times daily.     . Lancets (FREESTYLE) lancets every 3 (three) days. Reported on 09/11/2015  4  . LUMIGAN 0.01 % SOLN Place 1 drop into both eyes at bedtime.     . metFORMIN (GLUCOPHAGE) 500 MG tablet Take 500 mg by mouth 2 (two) times daily with a meal.     . Omega-3 Fatty Acids (FISH OIL) 1000 MG CAPS Take 1,000 mg by mouth 2 (two) times daily.    . pilocarpine (PILOCAR) 4 % ophthalmic solution Place 1 drop into both eyes 4  (four) times daily.     . polyethylene glycol (MIRALAX / GLYCOLAX) packet Take 17 g by mouth daily as needed.     . potassium chloride (MICRO-K) 10 MEQ CR capsule Take 10 mEq by mouth daily.   1  . warfarin (COUMADIN) 5 MG tablet TAKE 1 TABLET BY MOUTH AS DIRECTED 120 tablet 1  . timolol (TIMOPTIC) 0.5 % ophthalmic solution Place 1 drop into both eyes 2 (two) times daily.      No current facility-administered medications for this visit.     Allergies:   Review of patient's allergies indicates no known allergies.    Social History:  The patient  reports that she has quit smoking. She has never used smokeless tobacco. She reports that she does not drink alcohol or use drugs.   Family History:  The patient's family history includes Arrhythmia in her sister; Heart attack in her sister.    ROS:  Please see the history of present illness.   Otherwise, review of systems are positive for joint pains.   All other systems are reviewed and negative.    PHYSICAL EXAM: VS:  BP 120/62   Pulse 67   Ht 5\' 6"  (1.676 m)   Wt 219 lb (99.3 kg)   SpO2 97%   BMI 35.35 kg/m  , BMI Body mass index is 35.35 kg/m. GEN: Well nourished, well developed, in no acute distress  HEENT: normal  Neck: no JVD, carotid bruits, or masses Cardiac: irregularly irregular; no murmurs, rubs, or gallops,; bilateral LE edema  Respiratory:  clear to auscultation bilaterally, normal work of breathing GI: soft, nontender, nondistended, + BS MS: no deformity or atrophy  Skin: warm and dry, no rash Neuro:  Strength and sensation are intact Psych: euthymic mood, full affect   EKG:   The ekg ordered today demonstrates AFib, rate increased   Recent Labs: 09/07/2015: ALT 14; Hemoglobin 10.1; Platelets 219 11/26/2015: Brain Natriuretic Peptide 95.0; BUN 21; Creat 0.81; Potassium 5.0; Sodium 142   Lipid Panel    Component Value Date/Time   CHOL 136 09/19/2013 0955   TRIG 118 09/19/2013 0955   HDL 51 09/19/2013 0955    LDLCALC 61 09/19/2013 0955     Other studies Reviewed: Additional studies/ records that were reviewed today with results demonstrating: echo in 5/17 showed normal LV function.  No AS, or MR of significance.   ASSESSMENT AND PLAN:  1. AFib: Rate control is  not as effective over the past few weeks.  COntinue Dilt for rate control.  Coumadin for stroke prevention. INR checked today.  No bleeding issues.  She is steady on her feet.  Uses a walker.  Will add metoprolol 25 mg BID.  She had been on digoxin in the past and had done well.  F/u in 3 weeks with APP to check on HR control.  Just trying for resting HR < 100. Reassured her that some heart rate variability is expected.  2. HTN: Controlled at home.  COntinue current meds. COntinue current BP meds. Watch BP with metoprolol added.  3. Hyperlipidemia:  Last LDL in our system from 3/15: LDL 61. Checked with Dr. Delfina Redwood more recently.  WIll get those records.   4. Edema:  She continues diuretics.  She can take an extra Lasix up to once a week if she has more edema. She will take an extra potassium at thise times as well.  Elevate legs as much as possible.  If she has to do this frequently, will change prescription to BID prn dosing.  She has not had to take any extra Lasix.   I don't think the abdominal bloating is related to fluid overload. LE edema is stable.     Current medicines are reviewed at length with the patient today.  The patient concerns regarding her medicines were addressed.  The following changes have been made:  No change  Labs/ tests ordered today include:   Orders Placed This Encounter  Procedures  . EKG 12-Lead    Recommend 150 minutes/week of aerobic exercise Low fat, low carb, high fiber diet recommended  Disposition:   FU in 1 year   Signed, Larae Grooms, MD  04/07/2016 11:24 AM    [facility] [place], [place], [place]  [address] Phone: [phone]; Fax: [phone]

## 2016-04-07 NOTE — Patient Instructions (Signed)
Medication Instructions:  Start taking Metoprolol 25 mg twice daily. All other medications remain the same.  Labwork: None  Testing/Procedures: None  Follow-Up: In 3 weeks with an Extender (APP).     If you need a refill on your cardiac medications before your next appointment, please call your pharmacy.

## 2016-04-22 ENCOUNTER — Other Ambulatory Visit: Payer: Self-pay | Admitting: Internal Medicine

## 2016-04-22 ENCOUNTER — Ambulatory Visit
Admission: RE | Admit: 2016-04-22 | Discharge: 2016-04-22 | Disposition: A | Discharge status: Home / Self Care | Payer: Medicare Other | Source: Ambulatory Visit | Attending: Internal Medicine | Admitting: Internal Medicine

## 2016-04-22 DIAGNOSIS — R198 Other specified symptoms and signs involving the digestive system and abdomen: Secondary | ICD-10-CM

## 2016-04-24 ENCOUNTER — Encounter: Payer: Self-pay | Admitting: Physician Assistant

## 2016-04-24 NOTE — Progress Notes (Deleted)
Cardiology Office Note    Date:  04/24/2016  ID:  Yolanda James, DOB 1931/04/30, MRN [id number] PCP:  [name], MD  Cardiologist:  [name]   Chief Complaint: f/u afib  History of Present Illness:  Yolanda James is a 82 y.o. female with history of chronic atrial fib, chronic diastolic CHF, HTN, glaucoma, osteoarthritis, hyperlipidemia, diabetes mellitus, constipation, anemia who returns for follow-up of atrial fib. Sotalol was stopped in 2012. She was seen by Dr. [name] on 04/07/16 at which time HR was in 140s in Coumadin clinic. She was concerned about variable HR. He added metoprolol 25mg  BID. Last labs 11/2015 showed Cr 0.81; Hgb in 08/2015 was 10.1.   labs to exclude anmeia    Past Medical History:  Diagnosis Date  . Acute blood loss anemia   . Chronic a-fib ([place])    a. DCCV 02/05/2007. b. sotalol stopped 2012. c. managed with rate control.  . Chronic diastolic CHF (congestive heart failure) (HCC)    associated with AFIB RVR  . Essential hypertension   . Glaucoma   . Hypokalemia   . Long term current use of anticoagulant therapy   . Osteoarthritis    hands and knees, back & knees & "all over"   . Pure hypercholesterolemia   . Slow transit constipation   . Type 2 diabetes mellitus without complication, with long-term current use of insulin [facility])     Past Surgical History:  Procedure Laterality Date  . EYE SURGERY     laser & cataracts removed & IOL both eyes, glaucoma surgery   . GANGLION CYST EXCISION Left 1965   wrist  . TOTAL HIP ARTHROPLASTY Right 09/03/2015   Procedure: TOTAL HIP ARTHROPLASTY;  Surgeon: Frederik Pear, MD;  Location: [facility];  Service: Orthopedics;  Laterality: Right;  . vaginal births     x3    Current Medications: Current Outpatient Prescriptions  Medication Sig Dispense Refill  . atorvastatin (LIPITOR) 80 MG tablet Take 1 tablet (80 mg total) by mouth daily. 90 tablet 3  . Coenzyme Q10 (CO Q 10) 100 MG CAPS Take 1 capsule by mouth  daily.    Marland Kitchen diltiazem (CARDIZEM CD) 240 MG 24 hr capsule Take 1 capsule (240 mg total) by mouth daily. 90 capsule 3  . dorzolamide (TRUSOPT) 2 % ophthalmic solution Place 1 drop into both eyes 2 (two) times daily.     . dorzolamide-timolol (COSOPT) 22.3-6.8 MG/ML ophthalmic solution Place 1 drop into both eyes 2 (two) times daily.     . enalapril (VASOTEC) 5 MG tablet Take 5 mg by mouth 2 (two) times daily.     Marland Kitchen FREESTYLE LITE test strip every 3 (three) days. Reported on 09/11/2015  0  . furosemide (LASIX) 40 MG tablet Take 1 tablet daily. May take additional dose once weekly for swelling. 105 tablet 3  . glipiZIDE (GLUCOTROL XL) 2.5 MG 24 hr tablet Take 2.5 mg by mouth 2 (two) times daily.     . Lancets (FREESTYLE) lancets every 3 (three) days. Reported on 09/11/2015  4  . LUMIGAN 0.01 % SOLN Place 1 drop into both eyes at bedtime.     . metFORMIN (GLUCOPHAGE) 500 MG tablet Take 500 mg by mouth 2 (two) times daily with a meal.     . metoprolol tartrate (LOPRESSOR) 25 MG tablet Take 1 tablet (25 mg total) by mouth 2 (two) times daily. 60 tablet 3  . Omega-3 Fatty Acids (FISH OIL) 1000 MG CAPS Take 1,000 mg  by mouth 2 (two) times daily.    . pilocarpine (PILOCAR) 4 % ophthalmic solution Place 1 drop into both eyes 4 (four) times daily.     . polyethylene glycol (MIRALAX / GLYCOLAX) packet Take 17 g by mouth daily as needed.     . potassium chloride (MICRO-K) 10 MEQ CR capsule Take 10 mEq by mouth daily.   1  . timolol (TIMOPTIC) 0.5 % ophthalmic solution Place 1 drop into both eyes 2 (two) times daily.     Marland Kitchen warfarin (COUMADIN) 5 MG tablet TAKE 1 TABLET BY MOUTH AS DIRECTED 120 tablet 1   No current facility-administered medications for this visit.      Allergies:   Review of patient's allergies indicates no known allergies.   Social History   Social History  . Marital status: Widowed    Spouse name: N/A  . Number of children: N/A  . Years of education: N/A   Social History Main Topics    . Smoking status: Former Research scientist (life sciences)  . Smokeless tobacco: Never Used  . Alcohol use No  . Drug use: No  . Sexual activity: Not on file   Other Topics Concern  . Not on file   Social History Narrative  . No narrative on file     Family History:  The patient's family history includes Arrhythmia in her sister; Heart attack in her sister. ***  ROS:   Please see the history of present illness. Otherwise, review of systems is positive for ***.  All other systems are reviewed and otherwise negative.    PHYSICAL EXAM:   VS:  There were no vitals taken for this visit.  BMI: There is no height or weight on file to calculate BMI. GEN: Well nourished, well developed, in no acute distress HEENT: normocephalic, atraumatic Neck: no JVD, carotid bruits, or masses Cardiac: ***RRR; no murmurs, rubs, or gallops, no edema  Respiratory:  clear to auscultation bilaterally, normal work of breathing GI: soft, nontender, nondistended, + BS MS: no deformity or atrophy Skin: warm and dry, no rash Neuro:  Alert and Oriented x 3, Strength and sensation are intact, follows commands Psych: euthymic mood, full affect  Wt Readings from Last 3 Encounters:  04/07/16 219 lb (99.3 kg)  02/05/16 219 lb (99.3 kg)  12/13/15 218 lb 12.8 oz (99.2 kg)      Studies/Labs Reviewed:   EKG:  EKG was ordered today and personally reviewed by me and demonstrates *** EKG was not ordered today.***  Recent Labs: 09/07/2015: ALT 14; Hemoglobin 10.1; Platelets 219 11/26/2015: Brain Natriuretic Peptide 95.0; BUN 21; Creat 0.81; Potassium 5.0; Sodium 142   Lipid Panel    Component Value Date/Time   CHOL 136 09/19/2013 0955   TRIG 118 09/19/2013 0955   HDL 51 09/19/2013 0955   LDLCALC 61 09/19/2013 0955    Additional studies/ records that were reviewed today include: Summarized above.***    ASSESSMENT & PLAN:   1. Chronic atrial fib 2. Essential HTN 3. Chronic diastolic CHF 4. History of anemia  Disposition:  F/u with ***   Medication Adjustments/Labs and Tests Ordered: Current medicines are reviewed at length with the patient today.  Concerns regarding medicines are outlined above. Medication changes, Labs and Tests ordered today are summarized above and listed in the Patient Instructions accessible in Encounters.   Raechel Ache PA-C  04/24/2016 5:10 PM    [facility] [place], [place],   [address] Phone: [phone]; Fax: [phone]  

## 2016-04-25 ENCOUNTER — Ambulatory Visit (INDEPENDENT_AMBULATORY_CARE_PROVIDER_SITE_OTHER): Payer: Medicare Other

## 2016-04-25 ENCOUNTER — Encounter: Payer: Self-pay | Admitting: Physician Assistant

## 2016-04-25 ENCOUNTER — Ambulatory Visit (INDEPENDENT_AMBULATORY_CARE_PROVIDER_SITE_OTHER): Payer: Medicare Other | Admitting: Physician Assistant

## 2016-04-25 VITALS — BP 100/76 | HR 87 | Ht 66.0 in | Wt 221.0 lb

## 2016-04-25 DIAGNOSIS — I482 Chronic atrial fibrillation, unspecified: Secondary | ICD-10-CM

## 2016-04-25 DIAGNOSIS — Z5181 Encounter for therapeutic drug level monitoring: Secondary | ICD-10-CM

## 2016-04-25 DIAGNOSIS — I959 Hypotension, unspecified: Secondary | ICD-10-CM

## 2016-04-25 DIAGNOSIS — Z862 Personal history of diseases of the blood and blood-forming organs and certain disorders involving the immune mechanism: Secondary | ICD-10-CM

## 2016-04-25 DIAGNOSIS — I5032 Chronic diastolic (congestive) heart failure: Secondary | ICD-10-CM | POA: Diagnosis not present

## 2016-04-25 DIAGNOSIS — I10 Essential (primary) hypertension: Secondary | ICD-10-CM

## 2016-04-25 DIAGNOSIS — I4891 Unspecified atrial fibrillation: Secondary | ICD-10-CM | POA: Diagnosis not present

## 2016-04-25 LAB — POCT INR: INR: 5.1

## 2016-04-25 NOTE — Progress Notes (Signed)
Cardiology Office Note    Date:  04/25/2016   ID:  Yolanda James, DOB 06-02-1931, MRN [id number]  PCP:  [name], MD  Cardiologist:  Dr. [name]  Chief Complaint: afib follow up  History of Present Illness:   Yolanda James is a 82 y.o. female with history of chronic atrial fib, chronic diastolic CHF, HTN, glaucoma, osteoarthritis, hyperlipidemia, diabetes mellitus, constipation, anemia who returns for follow-up of atrial fib.   Sotalol was stopped in 2012.   She was seen by Dr. [name] on 04/07/16 at which time HR was in 140s in Coumadin clinic. She was concerned about variable HR. He added metoprolol 25mg  BID. Last labs 11/2015 showed Cr 0.81; Hgb in 08/2015 was 10.1.   Here today for follow up. She is feeling better. Intermittent dizziness. Her rate in 70-80s at home however BP intermittently in 90s/50s. She denies orthopnea, PNd, syncope, melena, blood in her stool or urine. Intermittent R lower extremity swelling that she attributes to hip replacement. She did not felt palpitations.    Past Medical History:  Diagnosis Date  . Acute blood loss anemia   . Chronic a-fib ([place])    a. DCCV 02/05/2007. b. sotalol stopped 2012. c. managed with rate control.  . Chronic diastolic CHF (congestive heart failure) (HCC)    associated with AFIB RVR  . Essential hypertension   . Glaucoma   . Hypokalemia   . Long term current use of anticoagulant therapy   . Osteoarthritis    hands and knees, back & knees & "all over"   . Pure hypercholesterolemia   . Slow transit constipation   . Type 2 diabetes mellitus without complication, with long-term current use of insulin [facility])     Past Surgical History:  Procedure Laterality Date  . EYE SURGERY     laser & cataracts removed & IOL both eyes, glaucoma surgery   . GANGLION CYST EXCISION Left 1965   wrist  . TOTAL HIP ARTHROPLASTY Right 09/03/2015   Procedure: TOTAL HIP ARTHROPLASTY;  Surgeon: Frederik Pear, MD;  Location: [facility];   Service: Orthopedics;  Laterality: Right;  . vaginal births     x3    Current Medications: Prior to Admission medications   Medication Sig Start Date End Date Taking? Authorizing Provider  atorvastatin (LIPITOR) 80 MG tablet Take 1 tablet (80 mg total) by mouth daily. 05/02/15  Yes Jettie Booze, MD  Coenzyme Q10 (CO Q 10) 100 MG CAPS Take 1 capsule by mouth daily.   Yes Historical Provider, MD  diltiazem (CARDIZEM CD) 240 MG 24 hr capsule Take 1 capsule (240 mg total) by mouth daily. 11/26/15  Yes Brittainy Erie Noe, PA-C  dorzolamide (TRUSOPT) 2 % ophthalmic solution Place 1 drop into both eyes 2 (two) times daily.  03/11/16  Yes Historical Provider, MD  dorzolamide-timolol (COSOPT) 22.3-6.8 MG/ML ophthalmic solution Place 1 drop into both eyes 2 (two) times daily.  12/14/13  Yes Historical Provider, MD  enalapril (VASOTEC) 5 MG tablet Take 5 mg by mouth 2 (two) times daily.  10/18/13  Yes Historical Provider, MD  FREESTYLE LITE test strip every 3 (three) days. Reported on 09/11/2015 11/30/14  Yes Historical Provider, MD  furosemide (LASIX) 40 MG tablet Take 1 tablet daily. May take additional dose once weekly for swelling. 02/05/16  Yes Jettie Booze, MD  glipiZIDE (GLUCOTROL XL) 2.5 MG 24 hr tablet Take 2.5 mg by mouth 2 (two) times daily.  12/13/13  Yes Historical Provider, MD  Lancets (FREESTYLE) lancets every 3 (three) days. Reported on 09/11/2015 11/29/14  Yes Historical Provider, MD  LUMIGAN 0.01 % SOLN Place 1 drop into both eyes at bedtime.  09/28/13  Yes Historical Provider, MD  metFORMIN (GLUCOPHAGE) 500 MG tablet Take 500 mg by mouth 2 (two) times daily with a meal.  12/10/13  Yes Historical Provider, MD  metoprolol tartrate (LOPRESSOR) 25 MG tablet Take 1 tablet (25 mg total) by mouth 2 (two) times daily. 04/07/16 07/06/16 Yes Jettie Booze, MD  Omega-3 Fatty Acids (FISH OIL) 1000 MG CAPS Take 1,000 mg by mouth 2 (two) times daily.   Yes Historical Provider, MD  pilocarpine  (PILOCAR) 4 % ophthalmic solution Place 1 drop into both eyes 4 (four) times daily.  11/23/13  Yes Historical Provider, MD  polyethylene glycol (MIRALAX / GLYCOLAX) packet Take 17 g by mouth daily as needed.    Yes Historical Provider, MD  potassium chloride (MICRO-K) 10 MEQ CR capsule Take 10 mEq by mouth daily.  12/01/15  Yes Historical Provider, MD  timolol (TIMOPTIC) 0.5 % ophthalmic solution Place 1 drop into both eyes 2 (two) times daily.  03/13/16  Yes Historical Provider, MD  warfarin (COUMADIN) 5 MG tablet TAKE 1 TABLET BY MOUTH AS DIRECTED 01/29/16  Yes Jettie Booze, MD    Allergies:   Review of patient's allergies indicates no known allergies.   Social History   Social History  . Marital status: Widowed    Spouse name: N/A  . Number of children: N/A  . Years of education: N/A   Social History Main Topics  . Smoking status: Former Research scientist (life sciences)  . Smokeless tobacco: Never Used  . Alcohol use No  . Drug use: No  . Sexual activity: Not Asked   Other Topics Concern  . None   Social History Narrative  . None     Family History:  The patient's family history includes Arrhythmia in her sister; Heart attack in her sister.   ROS:   Please see the history of present illness.    ROS All other systems reviewed and are negative.   PHYSICAL EXAM:   VS:  BP 100/76   Pulse 87   Ht 5\' 6"  (1.676 m)   Wt 221 lb (100.2 kg)   SpO2 90%   BMI 35.67 kg/m    GEN: Well nourished, well developed elderly female in no acute distress  HEENT: normal  Neck: no JVD, carotid bruits, or masses Cardiac: Ir IR ; no murmurs, rubs, or gallops,no edema  Respiratory:  clear to auscultation bilaterally, normal work of breathing GI: soft, nontender, nondistended, + BS MS: no deformity or atrophy  Skin: warm and dry, no rash Neuro:  Alert and Oriented x 3, Strength and sensation are intact Psych: euthymic mood, full affect  Wt Readings from Last 3 Encounters:  04/25/16 221 lb (100.2 kg)    04/07/16 219 lb (99.3 kg)  02/05/16 219 lb (99.3 kg)      Studies/Labs Reviewed:   EKG:  EKG is ordered today.  The ekg demonstrates afib at rate of 102 bpm.   Recent Labs: 09/07/2015: ALT 14; Hemoglobin 10.1; Platelets 219 11/26/2015: Brain Natriuretic Peptide 95.0; BUN 21; Creat 0.81; Potassium 5.0; Sodium 142   Lipid Panel    Component Value Date/Time   CHOL 136 09/19/2013 0955   TRIG 118 09/19/2013 0955   HDL 51 09/19/2013 0955   LDLCALC 61 09/19/2013 0955    Additional studies/ records that were reviewed today  include:   Echocardiogram: 12/13/15 LV EF: 50% -   55%  ------------------------------------------------------------------- Indications:      CHF (I50.30).  ------------------------------------------------------------------- History:   PMH:  Edema.  Atrial fibrillation.  Risk factors: Hypertension.  ------------------------------------------------------------------- Study Conclusions  - Left ventricle: The cavity size was normal. Wall thickness was   normal. Systolic function was normal. The estimated ejection   fraction was in the range of 50% to 55%. Wall motion was normal;   there were no regional wall motion abnormalities. The study is   not technically sufficient to allow evaluation of LV diastolic   function. - Ventricular septum: Septal motion showed paradox. - Left atrium: The atrium was mildly dilated. - Right ventricle: The cavity size was mildly dilated. - Right atrium: The atrium was mildly dilated. - Tricuspid valve: There was mild-moderate regurgitation directed   centrally. - Pulmonary arteries: Systolic pressure was mildly increased. PA   peak pressure: 45 mm Hg (S).    ASSESSMENT & PLAN:    1.Afib - Rate is improved at home to 70-80s. Here 100 today. Continue BB and Cardizem for rate control. Coumadin for anticoagulation. Per Dr. [name] goal of resting HR < 100. had been on digoxin in the past and had done well. Can consider  if long time issue with rate control.   2. HTN - Intermittently her BP runs in 90/50s at home. Likely contributing factor to her intermittent dizziness. Will held her Enalapril. Keep log of BP and HR.   3. LE edema - Stable. Continue lasix .   4. Anemia - Followed by PCP. Had CBC done yesterday. No melena or blood in her stool or urine.   Medication Adjustments/Labs and Tests Ordered: Current medicines are reviewed at length with the patient today.  Concerns regarding medicines are outlined above.  Medication changes, Labs and Tests ordered today are listed in the Patient Instructions below. Patient Instructions  Medication Instructions: Your physician has recommended you make the following change in your medication:   1. STOP Enalapril   Labwork: None ordered  Procedures/Testing: None Ordered  Follow-Up: Your physician recommends that you schedule a follow-up appointment in 1 MONTH with Dr. [name] or with an APP on the same day Dr. Beau Fanny is in the office   Any Additional Special Instructions Will Be Listed Below (If Applicable).     If you need a refill on your cardiac medications before your next appointment, please call your pharmacy.      Jarrett Soho, [place]  04/25/2016 10:32 AM    [facility] [place], [place], [place]  [address] Phone: [phone]; Fax: [phone]

## 2016-04-25 NOTE — Patient Instructions (Addendum)
Medication Instructions: Your physician has recommended you make the following change in your medication:   1. STOP Enalapril   Labwork: None ordered  Procedures/Testing: None Ordered  Follow-Up: Your physician recommends that you schedule a follow-up appointment in 1 MONTH with Dr. [name] or with an APP on the same day Dr. Beau Fanny is in the office   Any Additional Special Instructions Will Be Listed Below (If Applicable).     If you need a refill on your cardiac medications before your next appointment, please call your pharmacy.

## 2016-05-01 ENCOUNTER — Other Ambulatory Visit: Payer: Self-pay | Admitting: Internal Medicine

## 2016-05-01 DIAGNOSIS — R198 Other specified symptoms and signs involving the digestive system and abdomen: Secondary | ICD-10-CM

## 2016-05-05 ENCOUNTER — Other Ambulatory Visit: Payer: Self-pay | Admitting: Interventional Cardiology

## 2016-05-05 ENCOUNTER — Ambulatory Visit (INDEPENDENT_AMBULATORY_CARE_PROVIDER_SITE_OTHER): Payer: Medicare Other | Admitting: *Deleted

## 2016-05-05 DIAGNOSIS — I482 Chronic atrial fibrillation, unspecified: Secondary | ICD-10-CM

## 2016-05-05 DIAGNOSIS — Z5181 Encounter for therapeutic drug level monitoring: Secondary | ICD-10-CM | POA: Diagnosis not present

## 2016-05-05 DIAGNOSIS — I4891 Unspecified atrial fibrillation: Secondary | ICD-10-CM

## 2016-05-05 LAB — POCT INR: INR: 5.5

## 2016-05-07 ENCOUNTER — Ambulatory Visit
Admission: RE | Admit: 2016-05-07 | Discharge: 2016-05-07 | Disposition: A | Discharge status: Home / Self Care | Payer: Medicare Other | Source: Ambulatory Visit | Attending: Internal Medicine | Admitting: Internal Medicine

## 2016-05-07 DIAGNOSIS — R198 Other specified symptoms and signs involving the digestive system and abdomen: Secondary | ICD-10-CM

## 2016-05-07 MED ORDER — IOPAMIDOL (ISOVUE-300) INJECTION 61%
125.0000 mL | Freq: Once | INTRAVENOUS | Status: AC | PRN
  Administered 2016-05-07: 125 mL via INTRAVENOUS

## 2016-05-09 ENCOUNTER — Telehealth: Payer: Self-pay | Admitting: *Deleted

## 2016-05-09 NOTE — Telephone Encounter (Signed)
Patient returned call and was given appointment for 05/12/16 with Dr. Benay Spice at 1:45/2:00. Her daughter will come with her to the appointment. Confirmed she has had no other scan other than CT A/P on 10/18 and no biopsy thus far. She saw Dr. Delfina Redwood today and he told her scan result. She had more blood drawn there today. Reports feeling fatigued and weak and "bloated" for last 2 weeks. Suggested she try eating small meals throughout the day and drink Glucerna 1-2/day.

## 2016-05-09 NOTE — Telephone Encounter (Signed)
Oncology Nurse Navigator Documentation  Oncology Nurse Navigator Flowsheets 05/09/2016  Navigator Location [facility]  Referral date to RadOnc/MedOnc 05/09/2016  Navigator Encounter Type Introductory phone call  Left VM for patient to call nurse navigator regarding new patient appointment to see Dr. Benay Spice on 05/12/16 at 2pm at request of Dr. Delfina Redwood. Notified HIM to obtain office notes from PCP>

## 2016-05-12 ENCOUNTER — Telehealth: Payer: Self-pay | Admitting: Pharmacist Clinician (PhC)/ Clinical Pharmacy Specialist

## 2016-05-12 ENCOUNTER — Ambulatory Visit (HOSPITAL_BASED_OUTPATIENT_CLINIC_OR_DEPARTMENT_OTHER): Payer: Medicare Other | Admitting: Oncology

## 2016-05-12 ENCOUNTER — Encounter: Payer: Self-pay | Admitting: *Deleted

## 2016-05-12 ENCOUNTER — Other Ambulatory Visit (HOSPITAL_COMMUNITY): Payer: Self-pay | Admitting: Internal Medicine

## 2016-05-12 VITALS — BP 125/65 | HR 101 | Temp 98.3°F | Resp 20 | Wt 225.9 lb

## 2016-05-12 DIAGNOSIS — C801 Malignant (primary) neoplasm, unspecified: Secondary | ICD-10-CM

## 2016-05-12 DIAGNOSIS — R188 Other ascites: Secondary | ICD-10-CM | POA: Diagnosis not present

## 2016-05-12 DIAGNOSIS — R935 Abnormal findings on diagnostic imaging of other abdominal regions, including retroperitoneum: Secondary | ICD-10-CM

## 2016-05-12 DIAGNOSIS — R180 Malignant ascites: Secondary | ICD-10-CM

## 2016-05-12 DIAGNOSIS — R140 Abdominal distension (gaseous): Secondary | ICD-10-CM

## 2016-05-12 DIAGNOSIS — R630 Anorexia: Secondary | ICD-10-CM | POA: Diagnosis not present

## 2016-05-12 NOTE — Patient Instructions (Signed)
  Care Plan Summary- 05/12/2016 Name: Yolanda James     DOB:  01-16-1931 Your Medical Team: Medical Oncologist:  Dr. Ma Rings Radiation Oncologist:   Surgeon:    Type of Cancer: Ovary/uterus vs. primary peritoneal vs GI cancer Stage/Grade:  Undetermined *Exact staging of your cancer is based on size of the tumor, depth of invasion, involvement of lymph nodes or not, and whether or not the cancer has spread beyond the primary site   Recommendations: Based on information available as of today's consult. Recommendations may change depending on the results of further tests or exams. 1) US paracentesis and biopsy of omental mass by interventional radiology 2) Referral to Dr. Everitt Amber in GYN Oncology 3) Next Steps: 1) [name] will call you with appointments 2) GYN oncology will call you with new patient appointment (  3) Return to Dr. Benay Spice on 05/22/16 4) Elevate your legs above your heart as much as possible Questions? [name], RN, BSN at [phone]. [name] is your Oncology Nurse Navigator and is available to assist you while you're receiving your medical care at [facility].

## 2016-05-12 NOTE — Progress Notes (Signed)
Yolanda James Patient Consult   Referring MD: Seward Carol, Md [address] [place], [place] [address]   Yolanda James 82 y.o.  [birth year]    Reason for Referral: Ascites, omental caking   HPI: Yolanda James reports a history of abdominal bloating and anorexia for the past several weeks. She saw Dr. Delfina Redwood. A plain x-ray on 04/22/2016 was negative. She saw Dr. Delfina Redwood in follow-up on 05/01/2016. Abdominal distention was noted. She was referred for a CT of the abdomen and pelvis. The CT on 05/07/2016 revealed a small left pleural effusion. Perihepatic and splenic ascites is noted. No focal liver lesion. The pancreas appeared unremarkable. The uterus is retroflexed with indistinctness of the posterior wall. Moderate pelvic ascites with abnormal soft tissue density in the anterior mesentery suspicious for omental caking. Soft tissue deposits are noted in the subdiaphragmatic and posterior cul-de-sac suspicious for metastatic seeding.  She is referred for oncology evaluation. Complex she complains of abdominal swelling and anorexia.   Past Medical History:  Diagnosis Date  . Acute blood loss anemia   . Chronic a-fib ([place])    a. DCCV 02/05/2007. b. sotalol stopped 2012. c. managed with rate control.  . Chronic diastolic CHF (congestive heart failure) (HCC)    associated with AFIB RVR  . Essential hypertension   . Glaucoma   . Hypokalemia   . Long term current use of anticoagulant therapy   . Osteoarthritis    hands and knees, back & knees & "all over"   . Pure hypercholesterolemia   . Slow transit constipation   . Type 2 diabetes mellitus without complication, with long-term current use of insulin ([place])     .  G3 P3  Past Surgical History:  Procedure Laterality Date  . EYE SURGERY     laser & cataracts removed & IOL both eyes, glaucoma surgery   . GANGLION CYST EXCISION Left 1965   wrist  . TOTAL HIP ARTHROPLASTY Right 09/03/2015   Procedure:  TOTAL HIP ARTHROPLASTY;  Surgeon: Frederik Pear, MD;  Location: [facility];  Service: Orthopedics;  Laterality: Right;  . vaginal births     x3    Medications: Reviewed  Allergies: No Known Allergies  Family history: Her sister and mother had "cancer ". She cannot be specific. No other family history of cancer.  Social History:   She lives alone in [place]. 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 She does not use tobacco or alcohol. No transfusion history. No risk factor for HIV or hepatitis.   ROS:   Positives include: Anorexia, weight gain, distended abdomen, swelling in the lower legs and ankles  A complete ROS was otherwise negative.  Physical Exam:  Blood pressure 125/65, pulse (!) 101, temperature 98.3 F (36.8 C), temperature source Oral, resp. rate 20, weight 225 lb 14.4 oz (102.5 kg), SpO2 97 %.  HEENT: Upper and lower denture plate, oropharynx without visible mass, neck without mass Lungs: Clear bilaterally Cardiac: Irregularly irregular Abdomen: Distended, no mass  Vascular: 1+ edema below the knee bilaterally Lymph nodes: No cervical, supraclavicular, axillary, or inguinal nodes Neurologic: Alert and oriented, the motor exam appears intact in the upper and lower extremities Skin: No rash Musculoskeletal: No spine tenderness Breasts: Bilateral breast without mass   LAB: 04/22/2016-hemoglobin 12.4, platelets 321,000, MCV 80.2, the CBC 11.1, ANC 8.4                    Creatinine 0.83, albumin 3.9, bilirubin  0.8, alkaline phosphatase 137, AST 17, ALT 13, calcium 9.2                     05/09/2016: CA 125-563.7, CEA-0.9  Imaging: As per history of present illness, CT abdomen/pelvis 05/07/2016-images were reviewed with Yolanda James and her James  Assessment/Plan:   1. Abdominal distention/anorexia  CT of the abdomen/pelvis 05/07/2016 revealed ascites, omental caking, and a small left pleural effusion  Elevated CA 125  2.   Atrial fibrillation -maintained on  Coumadin  3.   Diabetes  4.   Hypertension  5.   Glaucoma   Disposition:   Yolanda James presents with ascites, omental caking, and an elevated CA 125. The clinical presentation is most consistent with a GYN malignancy, most likely ovarian cancer or primary peritoneal carcinoma.  She will be referred for a diagnostic/therapeutic paracentesis and an omentum biopsy. We will make a referral to GYN oncology.  Yolanda James will return for an office visit and treatment planning on 05/22/2016. She will hold Coumadin beginning today in anticipation of the omental biopsy. She will resume Coumadin on the day of the biopsy procedure.  I discussed the probable diagnosis and recommendation for systemic chemotherapy with Yolanda James.  Approximately 50 minutes were spent with the patient today. The majority of the time was used for counseling and coordination of care.  Betsy Coder, MD  05/12/2016, 5:36 PM

## 2016-05-12 NOTE — Telephone Encounter (Signed)
Patient to have biopsy in next week at cancer center.  States Dr. Benay Spice would like her to be off coumadin for 2-3 days prior.    Patient with CHADS2 score of 2, advised okay to hold for 2-3 days, restart evening of biopsy unless told otherwise by Dr. Benay Spice.  Pt will call for repeat INR appt approx 1 week after biopsy.

## 2016-05-12 NOTE — Progress Notes (Signed)
Oncology Nurse Navigator Documentation  Oncology Nurse Navigator Flowsheets 05/12/2016  Navigator Location [facility]  Referral date to RadOnc/MedOnc -  Navigator Encounter Type Initial MedOnc  Abnormal Finding Date 05/07/2016  Patient Visit Type MedOnc;Initial  Treatment Phase Abnormal Scans  Barriers/Navigation Needs Coordination of Care;Education  Education Preparing for Upcoming Biopsy  Interventions Education;Coordination of Care  Coordination of Care Appts;Radiology--left message for IR requesting bx and para on same day (last dose of Coumadin was 05/11/16 & has been told by MD to hold till after bx). Spoke with new patient coordinator, Franki Cabot in GYN Oncology regarding referral.  Education Method Verbal;Written;Teach-back  Support Groups/Services Other--[facility] support services  Acuity Level 2  Time Spent with Patient 60  Met with patient and daughter, Jackalyn Lombard during new patient visit. Explained the role of the GI Nurse Navigator and provided New Patient Packet with information on: 1. CA 125 tumor marker test 2. [place] 3. Fall Safety Plan Answered questions, reviewed current diagnostic workup plan using TEACH back and provided emotional support. Provided copy of current plan. Yolanda James is a widow and lives alone. She is independent in ADL's. She presents in wheelchair today, but ambulates in her home without difficulty. Prepares her own meals and cleans as well. She has three grown children and several great grandchildren in the area. She checks her blood sugars at home every 3 days and CBG runs 80's to 100. Main complaint is of swelling in her legs and abdominal discomfort/pressure. She was instructed to elevate her feet above heart level as much as possible. She will see Dr. Benay Spice in office again on 05/22/16. [name], RN, BSN GI Oncology [place]

## 2016-05-12 NOTE — Patient Instructions (Signed)
Care Plan Summary- 05/12/2016 Name: Jaimarie Cosley     DOB:  Nov 27, 1930 Your Medical Team: Medical Oncologist:  Dr. Ma Rings Radiation Oncologist:   Surgeon:    Type of Cancer: Ovary/uterus vs. primary peritoneal vs GI cancer Stage/Grade:  Undetermined *Exact staging of your cancer is based on size of the tumor, depth of invasion, involvement of lymph nodes or not, and whether or not the cancer has spread beyond the primary site   Recommendations: Based on information available as of today's consult. Recommendations may change depending on the results of further tests or exams. 1) US paracentesis and biopsy of omental mass by interventional radiology 2) Referral to Dr. Everitt Amber in GYN Oncology 3) Next Steps: 1) [name] will call you with appointments 2) GYN oncology will call you with new patient appointment (  3) Return to Dr. Benay Spice on 05/22/16 4) Elevate your legs above your heart as much as possible Questions? [name], RN, BSN at [phone]. [name] is your Oncology Nurse Navigator and is available to assist you while you're receiving your medical care at [facility].

## 2016-05-13 ENCOUNTER — Ambulatory Visit (HOSPITAL_COMMUNITY)
Admission: RE | Admit: 2016-05-13 | Discharge: 2016-05-13 | Disposition: A | Discharge status: Home / Self Care | Payer: Medicare Other | Source: Ambulatory Visit | Attending: Oncology | Admitting: Oncology

## 2016-05-13 DIAGNOSIS — R188 Other ascites: Secondary | ICD-10-CM | POA: Insufficient documentation

## 2016-05-13 DIAGNOSIS — R180 Malignant ascites: Secondary | ICD-10-CM

## 2016-05-13 NOTE — Procedures (Signed)
Successful US guided paracentesis from LLQ.  Yielded 2.1L of clear yellow fluid.  No immediate complications.  Pt tolerated well.   Specimen was sent for labs.  Ascencion Dike PA-C 05/13/2016 2:48 PM

## 2016-05-14 ENCOUNTER — Telehealth: Payer: Self-pay | Admitting: *Deleted

## 2016-05-14 NOTE — Telephone Encounter (Signed)
Oncology Nurse Navigator Documentation  Oncology Nurse Navigator Flowsheets 05/14/2016  Navigator Location [facility]  Referral date to RadOnc/MedOnc -  Navigator Encounter Type Telephone  Telephone Outgoing Call;Appt Confirmation/Clarification;Patient Update  Abnormal Finding Date -  Patient Visit Type -  Treatment Phase Abnormal Scans  Barriers/Navigation Needs Coordination of Care--CT biopsy omental mass  Education -  Interventions Coordination of Care--called IR to follow up on status of biopsy--told it is in review with the radiologist. Feel may not be needed if paracentesis fluid returns a diagnosis of malignancy.   Coordination of Care -  Education Method -  Support Groups/Services -  Acuity Level 1  Time Spent with Patient 15  Gave patient her appointment with Dr. Benay Spice on 11/2 at 4 pm and with Dr. Skeet Latch on 10/26 at 2:30 pm. Dr. Skeet Latch will decide if the biopsy is needed after seeing her. Instructed her to hold her coumadin one more day. Sent update to Dr. Benay Spice via inbasket.

## 2016-05-14 NOTE — Telephone Encounter (Signed)
Notified patient of future appointment scheduled with Dr. Skeet Latch on 05/15/2016. Pt agreed with time and date of appointmnet

## 2016-05-15 ENCOUNTER — Ambulatory Visit: Payer: Medicare Other | Attending: Gynecologic Oncology | Admitting: Gynecologic Oncology

## 2016-05-15 DIAGNOSIS — C786 Secondary malignant neoplasm of retroperitoneum and peritoneum: Secondary | ICD-10-CM | POA: Diagnosis not present

## 2016-05-15 DIAGNOSIS — C801 Malignant (primary) neoplasm, unspecified: Secondary | ICD-10-CM | POA: Diagnosis not present

## 2016-05-15 DIAGNOSIS — R971 Elevated cancer antigen 125 [CA 125]: Secondary | ICD-10-CM

## 2016-05-15 DIAGNOSIS — C569 Malignant neoplasm of unspecified ovary: Secondary | ICD-10-CM | POA: Insufficient documentation

## 2016-05-15 DIAGNOSIS — R180 Malignant ascites: Secondary | ICD-10-CM

## 2016-05-15 DIAGNOSIS — R188 Other ascites: Secondary | ICD-10-CM | POA: Insufficient documentation

## 2016-05-15 DIAGNOSIS — C800 Disseminated malignant neoplasm, unspecified: Secondary | ICD-10-CM

## 2016-05-15 NOTE — Patient Instructions (Signed)
Plan to follow up after three cycles of chemotherapy for consideration of surgery.  Please call for any questions or concerns.

## 2016-05-15 NOTE — Progress Notes (Signed)
Consult Note: Gyn-Onc  Consult was requested by Dr. Illene Regulus for the evaluation of Yolanda James 82 y.o. female  CC: Peritoneal carcinomatosis, GYN origin  Assessment/Plan:  Ms. Yolanda James is a 82 y.o.  with moderate volume ascites omental caking and peritoneal cytology consistent with a GYN carcinoma. Her C CA-125 is elevated and the ratio between CA-125 and all the other cancer markers is much greater than 20. The options presented to Yolanda James were that of primary debulking surgery followed by adjuvant chemotherapy versus chemotherapy with interval cytoreduction. Given her age and other comorbid issues my recommendation is that we treat her with 3-4 cycles of Taxol and carboplatin as tolerated. Yolanda James was counseled regarding the fact that this appears to be at least a stage III malignancy.  She and her friend were informed that we can expect an excellent response to chemotherapy combined with surgery however the durability of this treatment is very poor and that 70% of women with a similar presentation ispleased will recur. Recurrence is often treatable but not curable  Yolanda James wishes to pursue treatment. She has 5 great grandchildren with whom she wants to spend as much time as possible. She was counseled regarding the importance of quality of life and is aware that after cycle 3 we will reassess her quality of life, response to treatment and have a discussion regarding either interval debulking or  continuation of chemotherapy without surgical intervention. Yolanda James in her friend agree to this plan.  Follow-up after cycle 3 of chemotherapy  Poor venous access Consideration for placement of Port-A-Cath  HPI: Yolanda James is a 82 y.o.   who presented to her primary care practitioner approximately a month ago and reported some discomfort. He follows up with her again 2 weeks later at that time her symptomatology had worsened somewhat. She reported a sense of  heaviness within the pelvis a worsening bloating and decreased appetite and weight gain with increase in abdominal girth. Dr. Delfina Redwood requested a CT scanthat was completed on 05/06/2016.  It was most notable for indistinctness of the posterior wall of the uterus with heterogeneous attenuation. Neoplastic process cannot be excluded. Correlation with GYN exam and MRI is recommended. There is moderate ascites bilateral paracolic gutter. There is abnormal soft tissue density in anterior mesentery with nodular appearance highly suspicious for omental caking. Metastatic disease or primary peritoneal disease cannot be excluded. Soft tissue deposits are noted right anterior subdiaphragmatic region axial image 17 the largest measures 1 cm. There are soft tissue deposits in posterior cul-de-sac suspicious for metastatic seeding the largest measures 2.3 cm.  Paracentesis 05/13/2016 Diagnosis PERITONEAL /ASCITIC FLUID (SPECIMEN 1 OF 1 COLLECTED 05/13/16): MALIGNANT CELLS PRESENT, CONSISTENT WITH CARCINOMA.  COMMENT: GIVEN THE CLINICAL SCENARIO, THE FINDINGS ARE CONSISTENT WITH INVOLVEMENT BY GYNECOLOGIC CARCINOMA.  Yolanda James's family history is notable for a mother diagnosed with an unspecified malignancy 69 years of age. Her sister was diagnosed with colon cancer at age 14.  She has a niece and nephew diagnosed with kidney cancer at 58 and 51 years respectively.   Mammogram 2 years ago within normal limits colonoscopy 6 years ago within normal limits CA-125 is 563.7 CEA 0.9 ,CA-19-9  1   Review of Systems:  Constitutional  Feels less well than usual Cardiovascular  No chest pain, shortness of breath, or edema Pulmonary  No cough or wheeze.  Gastro Intestinal  No nausea, vomitting, or diarrhoea. No bright red blood per rectum, no abdominal pain,  change in bowel movement, or constipation. Pelvic heaviness and abdominal bloating Genito Urinary  No frequency, urgency, dysuria, no vaginal bleeding or  discharge Musculo Skeletal  No myalgia, arthralgia, joint swelling or ports low back pain Neurologic  No weakness, numbness, change in gait,  Psychology  No depression, anxiety, insomnia.     Physical Exam: WD in NAD Neck  Supple NROM, without any enlargements.  Lymph Node Survey No cervical supraclavicular or inguinal adenopathy Cardiovascular  Irregular rate and rhythm Lungs  Clear to auscultation bilaterally, without wheezes/crackles/rhonchi.  Skin  No rash/lesions/breakdown  Psychiatry  Alert and oriented appropriate mood affect speech and reasoning. Abdomen  Distended examination consistent with the presence of ascites. No palpable omental cake,  Back No CVA tenderness Genito Urinary  Vulva/vagina: Normal external female genitalia.  No lesions. No discharge or bleeding.  Bladder/urethra:  No lesions or masses  Vagina: Atrophic without discharge or bleeding  Cervix: Normal appearing, no lesions.  Uterus: Mobile, no parametrial involvement or nodularity.  Adnexa: Cul-de-sac nodularity appreciable Rectal  Good tone, no masses  cul de sac nodularity present Extremities  No bilateral cyanosis, clubbing or edema, poor venous access.   Current Meds:  Outpatient Encounter Prescriptions as of 05/15/2016  Medication Sig  . atorvastatin (LIPITOR) 80 MG tablet TAKE 1 TABLET(80 MG) BY MOUTH DAILY  . Coenzyme Q10 (CO Q 10) 100 MG CAPS Take 1 capsule by mouth daily.  Marland Kitchen diltiazem (CARDIZEM CD) 240 MG 24 hr capsule Take 1 capsule (240 mg total) by mouth daily.  . dorzolamide (TRUSOPT) 2 % ophthalmic solution Place 1 drop into both eyes 2 (two) times daily.   . dorzolamide-timolol (COSOPT) 22.3-6.8 MG/ML ophthalmic solution Place 1 drop into both eyes 2 (two) times daily.   Marland Kitchen FREESTYLE LITE test strip every 3 (three) days. Reported on 09/11/2015  . furosemide (LASIX) 40 MG tablet Take 1 tablet daily. May take additional dose once weekly for swelling.  Marland Kitchen glipiZIDE (GLUCOTROL XL)  2.5 MG 24 hr tablet Take 2.5 mg by mouth 2 (two) times daily.   . Lancets (FREESTYLE) lancets every 3 (three) days. Reported on 09/11/2015  . LUMIGAN 0.01 % SOLN Place 1 drop into both eyes at bedtime.   . metFORMIN (GLUCOPHAGE) 500 MG tablet Take 500 mg by mouth 2 (two) times daily with a meal.   . metoprolol tartrate (LOPRESSOR) 25 MG tablet Take 1 tablet (25 mg total) by mouth 2 (two) times daily.  . Omega-3 Fatty Acids (FISH OIL) 1000 MG CAPS Take 1,000 mg by mouth 2 (two) times daily.  . pilocarpine (PILOCAR) 4 % ophthalmic solution Place 1 drop into both eyes 4 (four) times daily.   . polyethylene glycol (MIRALAX / GLYCOLAX) packet Take 17 g by mouth daily as needed.   . potassium chloride (MICRO-K) 10 MEQ CR capsule Take 10 mEq by mouth daily.   . timolol (TIMOPTIC) 0.5 % ophthalmic solution Place 1 drop into both eyes 2 (two) times daily.   Marland Kitchen warfarin (COUMADIN) 5 MG tablet TAKE 1 TABLET BY MOUTH AS DIRECTED (Patient taking differently: daily except 2.5mg  MWF)   No facility-administered encounter medications on file as of 05/15/2016.     Allergy: No Known Allergies  Social Hx:   Social History   Social History  . Marital status: Widowed    Spouse name: N/A  . Number of children: 3  . Years of education: N/A   Occupational History  . Retired     Educational psychologist   Social History  Main Topics  . Smoking status: Former Research scientist (life sciences)  . Smokeless tobacco: Never Used  . Alcohol use No  . Drug use: No  . Sexual activity: Not on file   Other Topics Concern  . Not on file   Social History Narrative   Widowed, lives alone   Independent in ADLs      Widowed lives alone performs all of her activities of daily living independently. Previous work activities included being a Printmaker working at [employer] and the no Educational psychologist in [employer].  Past Surgical Hx:  Past Surgical History:  Procedure Laterality Date  . EYE SURGERY     laser & cataracts removed & IOL both eyes, glaucoma  surgery   . GANGLION CYST EXCISION Left 1965   wrist  . TOTAL HIP ARTHROPLASTY Right 09/03/2015   Procedure: TOTAL HIP ARTHROPLASTY;  Surgeon: Frederik Pear, MD;  Location: [facility] [place];  Service: Orthopedics;  Laterality: Right;  . vaginal births     x3    Past Medical Hx:  Past Medical History:  Diagnosis Date  . Acute blood loss anemia   . Chronic a-fib ([place])    a. DCCV 02/05/2007. b. sotalol stopped 2012. c. managed with rate control.  . Chronic diastolic CHF (congestive heart failure) (HCC)    associated with AFIB RVR  . Essential hypertension   . Glaucoma   . Hypokalemia   . Long term current use of anticoagulant therapy   . Osteoarthritis    hands and knees, back & knees & "all over"   . Pure hypercholesterolemia   . Slow transit constipation   . Type 2 diabetes mellitus without complication, with long-term current use of insulin (HCC)     Past Gynecological History: Gravida 3 para 3 last menstrual period approximately 30 years ago no history of normal Pap test. Did not use any hormonal agents for contraception Family Hx:  Family History  Problem Relation Age of Onset  . Arrhythmia Sister   . Heart attack London Sheer, MD, PhD 05/15/2016, 10:59 AM

## 2016-05-19 ENCOUNTER — Telehealth: Payer: Self-pay | Admitting: *Deleted

## 2016-05-19 DIAGNOSIS — C569 Malignant neoplasm of unspecified ovary: Secondary | ICD-10-CM

## 2016-05-19 DIAGNOSIS — C800 Disseminated malignant neoplasm, unspecified: Secondary | ICD-10-CM

## 2016-05-19 NOTE — Telephone Encounter (Signed)
Oncology Nurse Navigator Documentation  Oncology Nurse Navigator Flowsheets 05/19/2016  Navigator Location [facility]  Referral date to RadOnc/MedOnc -  Navigator Encounter Type Telephone  Telephone Outgoing Call;Appt Confirmation/Clarification  Abnormal Finding Date -  Confirmed Diagnosis Date 05/13/2016  Patient Visit Type -  Treatment Phase Pre-Tx/Tx Discussion  Barriers/Navigation Needs -  Education -  Interventions Coordination of Care--chemo class/labs  Coordination of Care Appts--scheduled chemo class and labs for 11/1 at 10:00--she will have her daughter come as well.  Education Method -  Support Groups/Services -  Acuity Level 2  Time Spent with Patient 15  Discussed the treatment plan for Taxol/Carbo as recommended by Dr. Skeet Latch. Dr. Benay Spice will be ordering the treatment. Explained need for baseline labs and chemo education class prior to starting. Main complaints are still weakness and no appetite. Informed her that the cancer is what is causing these symptoms and getting treatment started will help.

## 2016-05-20 ENCOUNTER — Encounter: Payer: Self-pay | Admitting: *Deleted

## 2016-05-20 ENCOUNTER — Other Ambulatory Visit: Payer: Medicare Other

## 2016-05-20 ENCOUNTER — Encounter: Payer: Self-pay | Admitting: Interventional Cardiology

## 2016-05-20 ENCOUNTER — Other Ambulatory Visit (HOSPITAL_BASED_OUTPATIENT_CLINIC_OR_DEPARTMENT_OTHER): Payer: Medicare Other

## 2016-05-20 DIAGNOSIS — C569 Malignant neoplasm of unspecified ovary: Secondary | ICD-10-CM

## 2016-05-20 DIAGNOSIS — C800 Disseminated malignant neoplasm, unspecified: Secondary | ICD-10-CM

## 2016-05-20 LAB — CBC WITH DIFFERENTIAL/PLATELET
BASO%: 0.6 % (ref 0.0–2.0)
Basophils Absolute: 0.1 10*3/uL (ref 0.0–0.1)
EOS%: 0.8 % (ref 0.0–7.0)
Eosinophils Absolute: 0.1 10*3/uL (ref 0.0–0.5)
HCT: 40.2 % (ref 34.8–46.6)
HGB: 13 g/dL (ref 11.6–15.9)
LYMPH%: 10.6 % — AB (ref 14.0–49.7)
MCH: 25.1 pg (ref 25.1–34.0)
MCHC: 32.2 g/dL (ref 31.5–36.0)
MCV: 78 fL — ABNORMAL LOW (ref 79.5–101.0)
MONO#: 0.8 10*3/uL (ref 0.1–0.9)
MONO%: 8.1 % (ref 0.0–14.0)
NEUT#: 8.2 10*3/uL — ABNORMAL HIGH (ref 1.5–6.5)
NEUT%: 79.9 % — AB (ref 38.4–76.8)
PLATELETS: 405 10*3/uL — AB (ref 145–400)
RBC: 5.16 10*6/uL (ref 3.70–5.45)
RDW: 17 % — ABNORMAL HIGH (ref 11.2–14.5)
WBC: 10.2 10*3/uL (ref 3.9–10.3)
lymph#: 1.1 10*3/uL (ref 0.9–3.3)

## 2016-05-20 LAB — COMPREHENSIVE METABOLIC PANEL
ALT: 14 U/L (ref 0–55)
ANION GAP: 10 meq/L (ref 3–11)
AST: 20 U/L (ref 5–34)
Albumin: 2.5 g/dL — ABNORMAL LOW (ref 3.5–5.0)
Alkaline Phosphatase: 171 U/L — ABNORMAL HIGH (ref 40–150)
BUN: 14.5 mg/dL (ref 7.0–26.0)
CHLORIDE: 99 meq/L (ref 98–109)
CO2: 27 meq/L (ref 22–29)
CREATININE: 0.8 mg/dL (ref 0.6–1.1)
Calcium: 8.5 mg/dL (ref 8.4–10.4)
EGFR: 73 mL/min/{1.73_m2} — ABNORMAL LOW (ref >90)
GLUCOSE: 97 mg/dL (ref 70–140)
Potassium: 4.1 mEq/L (ref 3.5–5.1)
SODIUM: 137 meq/L (ref 136–145)
Total Bilirubin: 0.62 mg/dL (ref 0.20–1.20)
Total Protein: 6.2 g/dL — ABNORMAL LOW (ref 6.4–8.3)

## 2016-05-20 LAB — LACTATE DEHYDROGENASE: LDH: 212 U/L (ref 125–245)

## 2016-05-21 ENCOUNTER — Other Ambulatory Visit: Payer: Medicare Other

## 2016-05-22 ENCOUNTER — Ambulatory Visit (HOSPITAL_BASED_OUTPATIENT_CLINIC_OR_DEPARTMENT_OTHER): Payer: Medicare Other | Admitting: Oncology

## 2016-05-22 ENCOUNTER — Other Ambulatory Visit: Payer: Self-pay | Admitting: *Deleted

## 2016-05-22 VITALS — BP 123/78 | HR 104 | Temp 98.1°F | Resp 16 | Ht 66.0 in

## 2016-05-22 DIAGNOSIS — C569 Malignant neoplasm of unspecified ovary: Secondary | ICD-10-CM

## 2016-05-22 MED ORDER — DEXAMETHASONE 2 MG PO TABS: ORAL_TABLET | ORAL | 0 refills | Status: DC

## 2016-05-22 MED ORDER — ONDANSETRON HCL 8 MG PO TABS: 8.0000 mg | ORAL_TABLET | Freq: Three times a day (TID) | ORAL | 1 refills | Status: AC | PRN

## 2016-05-22 NOTE — Progress Notes (Signed)
    [place] OFFICE PROGRESS NOTE   Diagnosis: Ovarian cancer  INTERVAL HISTORY:   Ms. Buning returns as scheduled. She underwent a paracentesis on 05/13/2016. 2.1 L of fluid were removed. The cytology revealed malignant cells consistent with carcinoma. She saw Dr. Skeet Latch 05/15/2016. Chemotherapy is recommended. She complains of "weakness "and anorexia. The abdomen remains distended, but improved following the paracentesis 05/13/2016. Ms. Marcial has attended a chemotherapy teaching class.  Objective:  Vital signs in last 24 hours:  Blood pressure 123/78, pulse (!) 104, temperature 98.1 F (36.7 C), temperature source Oral, resp. rate 16, height 5\' 6"  (1.676 m), SpO2 99 %.   Resp: Lungs clear bilaterally Cardio: Irregular GI: Distended with ascites, no mass Vascular: Trace ankle edema bilaterally   Lab Results:  Lab Results  Component Value Date   WBC 10.2 05/20/2016   HGB 13.0 05/20/2016   HCT 40.2 05/20/2016   MCV 78.0 (L) 05/20/2016   PLT 405 (H) 05/20/2016   NEUTROABS 8.2 (H) 05/20/2016     Medications: I have reviewed the patient's current medications.  Assessment/Plan: 1. GYN malignancy presenting with Abdominal distention/anorexia ? CT of the abdomen/pelvis 05/07/2016 revealed ascites, omental caking, and a small left pleural effusion ? Elevated CA 125 ? Paracentesis 05/13/2016 with cytology positive for carcinoma  2.   Atrial fibrillation -maintained on Coumadin  3.   Diabetes  4.   Hypertension  5.   Glaucoma   Disposition:  Ms. Glatfelter appears stable. She has been diagnosed with metastatic carcinoma involving ascitic fluid. The clinical presentation is consistent with a GYN malignancy. She has been evaluated by GYN oncology. Chemotherapy is recommended. I recommend Taxol/carboplatin to be given on a 3 week schedule.  I reviewed the potential toxicities associated with this regimen including the chance for nausea/vomiting,  alopecia, and hematologic toxicity. We discussed the allergic reaction, bone pain, and neuropathy associated with Taxol. We discussed the potential for an allergic reaction with carboplatin.  She will be premedicated with Decadron prior to the first cycle of chemotherapy. I recommended she checked a blood sugar daily for 3-4 days following chemotherapy and contact us for a blood sugar of greater than 350.  We will arrange for another therapeutic paracentesis 05/23/2016. She will complete a first cycle of chemotherapy 05/23/2016.  She will return for a nadir CBC and office visit on 06/02/2016.  Approximate 40 minutes were spent with patient today. The majority of the time was used for counseling and coordination of care.  Betsy Coder, MD  05/22/2016  4:27 PM

## 2016-05-23 ENCOUNTER — Ambulatory Visit (HOSPITAL_BASED_OUTPATIENT_CLINIC_OR_DEPARTMENT_OTHER): Payer: Medicare Other

## 2016-05-23 VITALS — BP 119/61 | HR 80 | Temp 98.1°F | Resp 16

## 2016-05-23 DIAGNOSIS — C569 Malignant neoplasm of unspecified ovary: Secondary | ICD-10-CM | POA: Diagnosis not present

## 2016-05-23 DIAGNOSIS — Z5111 Encounter for antineoplastic chemotherapy: Secondary | ICD-10-CM

## 2016-05-23 MED ORDER — CARBOPLATIN CHEMO INJECTION 600 MG/60ML
461.5000 mg | Freq: Once | INTRAVENOUS | Status: AC
  Administered 2016-05-23: 460 mg via INTRAVENOUS
  Filled 2016-05-23: qty 46

## 2016-05-23 MED ORDER — SODIUM CHLORIDE 0.9 % IV SOLN
Freq: Once | INTRAVENOUS | Status: AC
  Administered 2016-05-23: 10:00:00 via INTRAVENOUS

## 2016-05-23 MED ORDER — FAMOTIDINE IN NACL 20-0.9 MG/50ML-% IV SOLN
20.0000 mg | Freq: Once | INTRAVENOUS | Status: AC
  Administered 2016-05-23: 20 mg via INTRAVENOUS

## 2016-05-23 MED ORDER — PALONOSETRON HCL INJECTION 0.25 MG/5ML
INTRAVENOUS | Status: AC
  Filled 2016-05-23: qty 5

## 2016-05-23 MED ORDER — DIPHENHYDRAMINE HCL 50 MG/ML IJ SOLN
INTRAMUSCULAR | Status: AC
  Filled 2016-05-23: qty 1

## 2016-05-23 MED ORDER — DEXAMETHASONE SODIUM PHOSPHATE 10 MG/ML IJ SOLN
10.0000 mg | Freq: Once | INTRAMUSCULAR | Status: AC
  Administered 2016-05-23: 10 mg via INTRAVENOUS

## 2016-05-23 MED ORDER — PALONOSETRON HCL INJECTION 0.25 MG/5ML
0.2500 mg | Freq: Once | INTRAVENOUS | Status: AC
  Administered 2016-05-23: 0.25 mg via INTRAVENOUS

## 2016-05-23 MED ORDER — PACLITAXEL CHEMO INJECTION 300 MG/50ML
175.0000 mg/m2 | Freq: Once | INTRAVENOUS | Status: AC
  Administered 2016-05-23: 378 mg via INTRAVENOUS
  Filled 2016-05-23: qty 63

## 2016-05-23 MED ORDER — DEXAMETHASONE SODIUM PHOSPHATE 10 MG/ML IJ SOLN
INTRAMUSCULAR | Status: AC
  Filled 2016-05-23: qty 1

## 2016-05-23 MED ORDER — DIPHENHYDRAMINE HCL 50 MG/ML IJ SOLN
25.0000 mg | Freq: Once | INTRAMUSCULAR | Status: AC
  Administered 2016-05-23: 25 mg via INTRAVENOUS

## 2016-05-23 MED ORDER — SODIUM CHLORIDE 0.9% FLUSH
10.0000 mL | INTRAVENOUS | Status: DC | PRN
  Filled 2016-05-23: qty 10

## 2016-05-23 MED ORDER — FAMOTIDINE IN NACL 20-0.9 MG/50ML-% IV SOLN
INTRAVENOUS | Status: AC
  Filled 2016-05-23: qty 50

## 2016-05-23 NOTE — Patient Instructions (Signed)
[facility] Discharge Instructions for Patients Receiving Chemotherapy  Today you received the following chemotherapy agents: Taxol, Carboplatin.  To help prevent nausea and vomiting after your treatment, we encourage you to take your nausea medication. DO NOT take Zofran for 3 days, may start on Monday 11/5. If you develop nausea and vomiting that is not controlled by your nausea medication, call the clinic.   BELOW ARE SYMPTOMS THAT SHOULD BE REPORTED IMMEDIATELY:  *FEVER GREATER THAN 100.5 F  *CHILLS WITH OR WITHOUT FEVER  NAUSEA AND VOMITING THAT IS NOT CONTROLLED WITH YOUR NAUSEA MEDICATION  *UNUSUAL SHORTNESS OF BREATH  *UNUSUAL BRUISING OR BLEEDING  TENDERNESS IN MOUTH AND THROAT WITH OR WITHOUT PRESENCE OF ULCERS  *URINARY PROBLEMS  *BOWEL PROBLEMS  UNUSUAL RASH Items with * indicate a potential emergency and should be followed up as soon as possible.  Feel free to call the clinic you have any questions or concerns. The clinic phone number is [phone].  Please show the [facility] at check-in to the Emergency Department and triage nurse.

## 2016-05-26 ENCOUNTER — Telehealth: Payer: Self-pay | Admitting: Oncology

## 2016-05-26 ENCOUNTER — Other Ambulatory Visit: Payer: Self-pay | Admitting: *Deleted

## 2016-05-26 ENCOUNTER — Encounter: Payer: Self-pay | Admitting: *Deleted

## 2016-05-26 ENCOUNTER — Telehealth: Payer: Self-pay | Admitting: Medical Oncology

## 2016-05-26 NOTE — Telephone Encounter (Signed)
sw pt to confirm upcoming appts per LOS °

## 2016-05-26 NOTE — Progress Notes (Signed)
Message left from patient's niece requesting call back regarding patient coming in today.  Call placed back to patient and patient states that she is feeling better and does not feel as though she needs to be seen today.  Patient instructed to call [facility] if symptoms worsen or if bleeding continues.  Dr. Benay Spice notified.  Patient appreciative of call back.

## 2016-05-26 NOTE — Telephone Encounter (Signed)
"   today has been a really bad day . I have not had an appetite. I am weak and hurting. I am all filled with fluid. She spoke to Dominica earlier today and is about the same. I reiterated that she call back if bleeding recurs. She drank 2 boost today . I encouraged her to take small sips of fluid.'I am doing the best I can" . She understands when to call back.

## 2016-05-26 NOTE — Progress Notes (Signed)
Message received from patient's niece asking for a call back, stated that patient is "having problems" .  Call placed back to patient and patient states that she is weak, having difficulty with ambulation, pain to her stomach and lower legs, bright red blood oozing from rectum x2, decreased appetite, bloating to abdomen and increased swelling to her legs.  Dr. Benay Spice notified of patient's complaints and order received to see if paracentesis can be done sooner than 06/04/16.  Central scheduling called and paracentesis moved up to 05/29/16.  Call again placed back to patient and spoke with patient's niece.  Notified her of new date of paracentesis and asked if patient could come in to be seen today in [facility].  Patient's niece states that she will talk with patient and call [place] back if patient wishes to be seen today.

## 2016-05-27 ENCOUNTER — Telehealth: Payer: Self-pay | Admitting: *Deleted

## 2016-05-27 ENCOUNTER — Other Ambulatory Visit: Payer: Self-pay | Admitting: *Deleted

## 2016-05-27 DIAGNOSIS — C569 Malignant neoplasm of unspecified ovary: Secondary | ICD-10-CM

## 2016-05-27 NOTE — Telephone Encounter (Signed)
Pt called reporting bowels problem.  Spoke with pt and was informed that pt had some nausea; took antiemetics with relief.  Stated she had several episodes of diarrhea - " just trickling down when I go to the bathroom " .  Asked if pt took Imodium; pt stated " No " .   Instructed pt to take Imodium now to help with diarrhea.  Pt stated she felt like she was dehydrated.  Pt was able to tell nurse all fluids intake - protein drink, milk, juice.  However, pt still felt that she was dehydrated.  Dr. Benay Spice notified.   OK for pt to see [facility] and to receive IVF if needed.   Schedule message sent. Pt voiced understanding.

## 2016-05-28 ENCOUNTER — Ambulatory Visit: Payer: Medicare Other | Admitting: Nurse Practitioner

## 2016-05-28 ENCOUNTER — Other Ambulatory Visit (HOSPITAL_BASED_OUTPATIENT_CLINIC_OR_DEPARTMENT_OTHER): Payer: Medicare Other

## 2016-05-28 ENCOUNTER — Encounter: Payer: Self-pay | Admitting: Nurse Practitioner

## 2016-05-28 ENCOUNTER — Ambulatory Visit (HOSPITAL_BASED_OUTPATIENT_CLINIC_OR_DEPARTMENT_OTHER): Payer: Medicare Other | Admitting: Nurse Practitioner

## 2016-05-28 ENCOUNTER — Ambulatory Visit (HOSPITAL_COMMUNITY)
Admission: RE | Admit: 2016-05-28 | Discharge: 2016-05-28 | Disposition: A | Discharge status: Home / Self Care | Payer: Medicare Other | Source: Ambulatory Visit | Attending: Oncology | Admitting: Oncology

## 2016-05-28 VITALS — BP 139/99 | HR 88 | Temp 97.5°F | Resp 18 | Ht 66.0 in | Wt 226.0 lb

## 2016-05-28 DIAGNOSIS — C569 Malignant neoplasm of unspecified ovary: Secondary | ICD-10-CM

## 2016-05-28 DIAGNOSIS — R197 Diarrhea, unspecified: Secondary | ICD-10-CM

## 2016-05-28 DIAGNOSIS — R180 Malignant ascites: Secondary | ICD-10-CM | POA: Insufficient documentation

## 2016-05-28 DIAGNOSIS — A4152 Sepsis due to Pseudomonas: Secondary | ICD-10-CM | POA: Diagnosis not present

## 2016-05-28 DIAGNOSIS — D689 Coagulation defect, unspecified: Secondary | ICD-10-CM | POA: Diagnosis not present

## 2016-05-28 LAB — CBC WITH DIFFERENTIAL/PLATELET
BASO%: 0.3 % (ref 0.0–2.0)
BASOS ABS: 0 10*3/uL (ref 0.0–0.1)
EOS ABS: 0.1 10*3/uL (ref 0.0–0.5)
EOS%: 4.8 % (ref 0.0–7.0)
HCT: 35.6 % (ref 34.8–46.6)
HEMOGLOBIN: 11.4 g/dL — AB (ref 11.6–15.9)
LYMPH%: 10.3 % — AB (ref 14.0–49.7)
MCH: 24.8 pg — AB (ref 25.1–34.0)
MCHC: 32 g/dL (ref 31.5–36.0)
MCV: 77.4 fL — AB (ref 79.5–101.0)
MONO#: 0 10*3/uL — ABNORMAL LOW (ref 0.1–0.9)
MONO%: 1.3 % (ref 0.0–14.0)
NEUT#: 1.9 10*3/uL (ref 1.5–6.5)
NEUT%: 83.3 % — AB (ref 38.4–76.8)
Platelets: 163 10*3/uL (ref 145–400)
RBC: 4.6 10*6/uL (ref 3.70–5.45)
RDW: 17.2 % — AB (ref 11.2–14.5)
WBC: 2.3 10*3/uL — ABNORMAL LOW (ref 3.9–10.3)
lymph#: 0.2 10*3/uL — ABNORMAL LOW (ref 0.9–3.3)

## 2016-05-28 LAB — MAGNESIUM: Magnesium: 1.3 mg/dl — CL (ref 1.5–2.5)

## 2016-05-28 LAB — COMPREHENSIVE METABOLIC PANEL
ALK PHOS: 152 U/L — AB (ref 40–150)
ALT: 33 U/L (ref 0–55)
AST: 42 U/L — AB (ref 5–34)
Albumin: 2.4 g/dL — ABNORMAL LOW (ref 3.5–5.0)
Anion Gap: 10 mEq/L (ref 3–11)
BUN: 24.6 mg/dL (ref 7.0–26.0)
CHLORIDE: 100 meq/L (ref 98–109)
CO2: 27 meq/L (ref 22–29)
Calcium: 8.2 mg/dL — ABNORMAL LOW (ref 8.4–10.4)
Creatinine: 0.7 mg/dL (ref 0.6–1.1)
EGFR: 78 mL/min/{1.73_m2} — AB (ref >90)
GLUCOSE: 104 mg/dL (ref 70–140)
POTASSIUM: 3.8 meq/L (ref 3.5–5.1)
SODIUM: 137 meq/L (ref 136–145)
Total Bilirubin: 0.77 mg/dL (ref 0.20–1.20)
Total Protein: 5.7 g/dL — ABNORMAL LOW (ref 6.4–8.3)

## 2016-05-28 LAB — TECHNOLOGIST REVIEW

## 2016-05-28 NOTE — Assessment & Plan Note (Signed)
Patient states that her abdomen feels bloated and full.  She was scheduled for paracentesis tomorrow; will is able to obtain a paracentesis today instead.  Notes from paracentesis obtained earlier today revealed the patient had approximately 1.5 L of abdominal fluid removed.

## 2016-05-28 NOTE — Assessment & Plan Note (Signed)
Patient's magnesium is down to 1.3 today.  Most likely, this is patient's diarrhea.  Patient was advised to take over-the-counter magnesium tablets 1 per day as directed.  Will continue to monitor.

## 2016-05-28 NOTE — Assessment & Plan Note (Signed)
Patient received cycle one of her carboplatin/Taxol chemotherapy regimen on 05/23/2016.  See further notes for details of today's visit.  Patient is scheduled to return on 06/02/2016 for labs and a follow-up visit.

## 2016-05-28 NOTE — Progress Notes (Signed)
SYMPTOM MANAGEMENT CLINIC    Chief Complaint: Diarrhea  HPI:  Yolanda James 82 y.o. female diagnosed with ovarian cancer.  Currently on carboplatin/Taxol chemotherapy regimen.    No history exists.    Review of Systems  Constitutional: Positive for malaise/fatigue.  Gastrointestinal: Positive for blood in stool and diarrhea.       Patient states that she has a history of hemorrhoids and does occasionally notice some blood in her stool.  All other systems reviewed and are negative.   Past Medical History:  Diagnosis Date  . Acute blood loss anemia   . Chronic a-fib ([place])    a. DCCV 02/05/2007. b. sotalol stopped 2012. c. managed with rate control.  . Chronic diastolic CHF (congestive heart failure) (HCC)    associated with AFIB RVR  . Essential hypertension   . Glaucoma   . Hypokalemia   . Long term current use of anticoagulant therapy   . Osteoarthritis    hands and knees, back & knees & "all over"   . Pure hypercholesterolemia   . Slow transit constipation   . Type 2 diabetes mellitus without complication, with long-term current use of insulin [facility])     Past Surgical History:  Procedure Laterality Date  . EYE SURGERY     laser & cataracts removed & IOL both eyes, glaucoma surgery   . GANGLION CYST EXCISION Left 1965   wrist  . TOTAL HIP ARTHROPLASTY Right 09/03/2015   Procedure: TOTAL HIP ARTHROPLASTY;  Surgeon: Frederik Pear, MD;  Location: [facility];  Service: Orthopedics;  Laterality: Right;  . vaginal births     x3    has Chronic atrial fibrillation ([place]); Encounter for therapeutic drug monitoring; Essential hypertension, benign; A-fib ([place]); Pure hypercholesterolemia; Primary osteoarthritis of right hip; Arthritis of right hip; Bilateral lower extremity edema; Ovarian cancer ([place]); Ascites; Carcinomatosis ([place]); Diarrhea; and Hypomagnesemia on her problem list.    has No Known Allergies.    Medication List       Accurate as of 05/28/16  5:32 PM. Always use  your most recent med list.          atorvastatin 80 MG tablet Commonly known as:  LIPITOR TAKE 1 TABLET(80 MG) BY MOUTH DAILY   Co Q 10 100 MG Caps Take 1 capsule by mouth daily.   dexamethasone 2 MG tablet Commonly known as:  DECADRON Take 5 tablets (10 mg) at 10PM on 05/22/16 and 5(60m)  tablets at 6AM on 05/23/16   diltiazem 240 MG 24 hr capsule Commonly known as:  CARDIZEM CD Take 1 capsule (240 mg total) by mouth daily.   dorzolamide 2 % ophthalmic solution Commonly known as:  TRUSOPT Place 1 drop into both eyes 2 (two) times daily.   Fish Oil 1000 MG Caps Take 1,000 mg by mouth 2 (two) times daily.   freestyle lancets every 3 (three) days. Reported on 09/11/2015   FREESTYLE LITE test strip Generic drug:  glucose blood every 3 (three) days. Reported on 09/11/2015   furosemide 40 MG tablet Commonly known as:  LASIX Take 1 tablet daily. May take additional dose once weekly for swelling.   glipiZIDE 2.5 MG 24 hr tablet Commonly known as:  GLUCOTROL XL Take 2.5 mg by mouth 2 (two) times daily.   LUMIGAN 0.01 % Soln Generic drug:  bimatoprost Place 1 drop into both eyes at bedtime.   metFORMIN 500 MG tablet Commonly known as:  GLUCOPHAGE Take 500 mg by mouth 2 (two) times daily  with a meal.   metoprolol tartrate 25 MG tablet Commonly known as:  LOPRESSOR Take 1 tablet (25 mg total) by mouth 2 (two) times daily.   ondansetron 8 MG tablet Commonly known as:  ZOFRAN Take 1 tablet (8 mg total) by mouth every 8 (eight) hours as needed for nausea or vomiting.   pilocarpine 4 % ophthalmic solution Commonly known as:  PILOCAR Place 1 drop into both eyes 4 (four) times daily.   polyethylene glycol packet Commonly known as:  MIRALAX / GLYCOLAX Take 17 g by mouth daily as needed.   potassium chloride 10 MEQ CR capsule Commonly known as:  MICRO-K Take 10 mEq by mouth daily.   timolol 0.5 % ophthalmic solution Commonly known as:  TIMOPTIC Place 1 drop into both  eyes 2 (two) times daily.   warfarin 5 MG tablet Commonly known as:  COUMADIN TAKE 1 TABLET BY MOUTH AS DIRECTED        PHYSICAL EXAMINATION  Oncology Vitals 05/28/2016 05/23/2016  Height 168 cm -  Weight 102.513 kg -  Weight (lbs) 226 lbs -  BMI (kg/m2) 36.48 kg/m2 -  Temp 97.5 98.1  Pulse 88 80  Resp 18 16  SpO2 97 99  BSA (m2) 2.18 m2 -   BP Readings from Last 2 Encounters:  05/28/16 134/63  05/28/16 (!) 139/99    Physical Exam  Constitutional: She is oriented to person, place, and time and well-developed, well-nourished, and in no distress.  HENT:  Head: Normocephalic and atraumatic.  Mouth/Throat: Oropharynx is clear and moist.  Eyes: Conjunctivae and EOM are normal. Pupils are equal, round, and reactive to light. Right eye exhibits no discharge. Left eye exhibits no discharge. No scleral icterus.  Neck: Normal range of motion. Neck supple. No JVD present. No tracheal deviation present. No thyromegaly present.  Cardiovascular: Normal rate, regular rhythm, normal heart sounds and intact distal pulses.   Pulmonary/Chest: Effort normal and breath sounds normal. No respiratory distress. She has no wheezes. She has no rales. She exhibits no tenderness.  Abdominal: Soft. Bowel sounds are normal. She exhibits distension. She exhibits no mass. There is no tenderness. There is no rebound and no guarding.  Musculoskeletal: Normal range of motion. She exhibits edema. She exhibits no tenderness.  Patient has chronic peripheral edema to bilateral lower extremities; it does appear that the edema to her ankles has now increased.  +3 edema to bilateral ankles.  Lymphadenopathy:    She has no cervical adenopathy.  Neurological: She is alert and oriented to person, place, and time. Gait normal.  Skin: Skin is warm and dry. No rash noted. No erythema. No pallor.  Psychiatric: Affect normal.  Nursing note and vitals reviewed.   LABORATORY DATA:. Appointment on 05/28/2016  Component  Date Value Ref Range Status  . WBC 05/28/2016 2.3* 3.9 - 10.3 10e3/uL Final  . NEUT# 05/28/2016 1.9  1.5 - 6.5 10e3/uL Final  . HGB 05/28/2016 11.4* 11.6 - 15.9 g/dL Final  . HCT 05/28/2016 35.6  34.8 - 46.6 % Final  . Platelets 05/28/2016 163  145 - 400 10e3/uL Final  . MCV 05/28/2016 77.4* 79.5 - 101.0 fL Final  . MCH 05/28/2016 24.8* 25.1 - 34.0 pg Final  . MCHC 05/28/2016 32.0  31.5 - 36.0 g/dL Final  . RBC 05/28/2016 4.60  3.70 - 5.45 10e6/uL Final  . RDW 05/28/2016 17.2* 11.2 - 14.5 % Final  . lymph# 05/28/2016 0.2* 0.9 - 3.3 10e3/uL Final  . MONO# 05/28/2016 0.0* 0.1 -  0.9 10e3/uL Final  . Eosinophils Absolute 05/28/2016 0.1  0.0 - 0.5 10e3/uL Final  . Basophils Absolute 05/28/2016 0.0  0.0 - 0.1 10e3/uL Final  . NEUT% 05/28/2016 83.3* 38.4 - 76.8 % Final  . LYMPH% 05/28/2016 10.3* 14.0 - 49.7 % Final  . MONO% 05/28/2016 1.3  0.0 - 14.0 % Final  . EOS% 05/28/2016 4.8  0.0 - 7.0 % Final  . BASO% 05/28/2016 0.3  0.0 - 2.0 % Final  . Sodium 05/28/2016 137  136 - 145 mEq/L Final  . Potassium 05/28/2016 3.8  3.5 - 5.1 mEq/L Final  . Chloride 05/28/2016 100  98 - 109 mEq/L Final  . CO2 05/28/2016 27  22 - 29 mEq/L Final  . Glucose 05/28/2016 104  70 - 140 mg/dl Final  . BUN 05/28/2016 24.6  7.0 - 26.0 mg/dL Final  . Creatinine 05/28/2016 0.7  0.6 - 1.1 mg/dL Final  . Total Bilirubin 05/28/2016 0.77  0.20 - 1.20 mg/dL Final  . Alkaline Phosphatase 05/28/2016 152* 40 - 150 U/L Final  . AST 05/28/2016 42* 5 - 34 U/L Final  . ALT 05/28/2016 33  0 - 55 U/L Final  . Total Protein 05/28/2016 5.7* 6.4 - 8.3 g/dL Final  . Albumin 05/28/2016 2.4* 3.5 - 5.0 g/dL Final  . Calcium 05/28/2016 8.2* 8.4 - 10.4 mg/dL Final  . Anion Gap 05/28/2016 10  3 - 11 mEq/L Final  . EGFR 05/28/2016 78* >90 ml/min/1.73 m2 Final  . Magnesium 05/28/2016 1.3* 1.5 - 2.5 mg/dl Final  . Technologist Review 05/28/2016 Rare metamyelocyte   Final    RADIOGRAPHIC STUDIES: US Paracentesis  Result Date:  05/28/2016 INDICATION: Patient with history of GYN malignancy, probable ovarian origin, recurrent ascites. Request made for therapeutic paracentesis. EXAM: ULTRASOUND GUIDED THERAPEUTIC PARACENTESIS MEDICATIONS: None. COMPLICATIONS: None immediate. PROCEDURE: Informed written consent was obtained from the patient after a discussion of the risks, benefits and alternatives to treatment. A timeout was performed prior to the initiation of the procedure. Initial ultrasound scanning demonstrates a small amount of ascites within the right lower abdominal quadrant. The right lower abdomen was prepped and draped in the usual sterile fashion. 1% lidocaine was used for local anesthesia. Following this, a Yueh catheter was introduced. An ultrasound image was saved for documentation purposes. The paracentesis was performed. The catheter was removed and a dressing was applied. The patient tolerated the procedure well without immediate post procedural complication. FINDINGS: A total of approximately 1.5 liters of slightly hazy, amber fluid was removed. IMPRESSION: Successful ultrasound-guided therapeutic paracentesis yielding 1.5 liters of peritoneal fluid. Read by: Rowe Robert, PA-C Electronically Signed   By: Jerilynn Mages.  Shick M.D.   On: 05/28/2016 12:36    ASSESSMENT/PLAN:    Ovarian cancer [facility]) Patient received cycle one of her carboplatin/Taxol chemotherapy regimen on 05/23/2016.  See further notes for details of today's visit.  Patient is scheduled to return on 06/02/2016 for labs and a follow-up visit.  Hypomagnesemia Patient's magnesium is down to 1.3 today.  Most likely, this is patient's diarrhea.  Patient was advised to take over-the-counter magnesium tablets 1 per day as directed.  Will continue to monitor.  Diarrhea Patient states that she has had some intermittent diarrhea since her first cycle of chemotherapy last week.  She states she has only taken occasional Imodium.  Patient states that she last took  Imodium this morning; and has had no further diarrhea issues.  Patient was instructed to take Imodium 2 tablets with each loose stool to  a maximum of 8 tablets per 24 hour period.  Also, patient was advised to push fluids is much as possible.  She was advised to return to the [place] for IV fluid rehydration.  If she is unable to drink fluids.  Also, patient states that she has a history of chronic hemorrhoids; has noted some bright red blood when she has frequent diarrhea.  However, patient's hemoglobin was stable today.  Also, platelets were stable as well.  Reviewed all lab results with Dr. Benay Spice; and he recommended that patient start taking ferrous sulfate 325 mg on a daily basis-since patient may have some issues with chronic anemia as well.  Will continue to monitor closely.  Ascites Patient states that her abdomen feels bloated and full.  She was scheduled for paracentesis tomorrow; will is able to obtain a paracentesis today instead.  Notes from paracentesis obtained earlier today revealed the patient had approximately 1.5 L of abdominal fluid removed.   Patient stated understanding of all instructions; and was in agreement with this plan of care. The patient knows to call the clinic with any problems, questions or concerns.   Total time spent with patient was 40 minutes;  with greater than 75 percent of that time spent in face to face counseling regarding patient's symptoms,  and coordination of care and follow up.  Disclaimer:This dictation was prepared with Dragon/digital dictation along with [employer]. Any transcriptional errors that result from this process are unintentional.  Drue Second, NP 05/28/2016

## 2016-05-28 NOTE — Procedures (Signed)
Ultrasound-guided  therapeutic paracentesis performed yielding 1.5 liters of slightly hazy, amber  fluid. No immediate complications.

## 2016-05-28 NOTE — Assessment & Plan Note (Addendum)
Patient states that she has had some intermittent diarrhea since her first cycle of chemotherapy last week.  She states she has only taken occasional Imodium.  Patient states that she last took Imodium this morning; and has had no further diarrhea issues.  Patient was instructed to take Imodium 2 tablets with each loose stool to a maximum of 8 tablets per 24 hour period.  Also, patient was advised to push fluids is much as possible.  She was advised to return to the [facility] for IV fluid rehydration.  If she is unable to drink fluids.  Also, patient states that she has a history of chronic hemorrhoids; has noted some bright red blood when she has frequent diarrhea.  However, patient's hemoglobin was stable today.  Also, platelets were stable as well.  Reviewed all lab results with Dr. Benay Spice; and he recommended that patient start taking ferrous sulfate 325 mg on a daily basis-since patient may have some issues with chronic anemia as well.  Will continue to monitor closely.

## 2016-05-29 ENCOUNTER — Ambulatory Visit (HOSPITAL_COMMUNITY): Payer: Medicare Other

## 2016-05-30 ENCOUNTER — Telehealth: Payer: Self-pay | Admitting: *Deleted

## 2016-05-30 ENCOUNTER — Other Ambulatory Visit: Payer: Self-pay | Admitting: *Deleted

## 2016-05-30 DIAGNOSIS — R112 Nausea with vomiting, unspecified: Secondary | ICD-10-CM

## 2016-05-30 DIAGNOSIS — C800 Disseminated malignant neoplasm, unspecified: Secondary | ICD-10-CM

## 2016-05-30 MED ORDER — SODIUM CHLORIDE 0.9 % IV SOLN: INTRAVENOUS | Status: DC

## 2016-05-30 MED ORDER — DEXAMETHASONE 4 MG PO TABS: 4.0000 mg | ORAL_TABLET | Freq: Two times a day (BID) | ORAL | 0 refills | Status: DC

## 2016-05-30 NOTE — Telephone Encounter (Signed)
Call transferred via [facility] call service: Pt calling to report bright red blood in stool x3. She is taking Imodium for diarrhea. Eating bites of food and drinking fluids. Asks what needs to be done about the bleeding. Per Dr. Benay Spice: Will refer to GI for evaluation. Come in as scheduled 11/13, if bleeding increases, or pt becomes weak go to ED. Returned call to pt, having more nausea today. Drinking very little, Zofran does not seem to be helping. Discussed with Dr. Benay Spice: Try Decadron 4mg  BID over the weekend. IV fluids on 11/11. If no better after IV hydration, pt to go to ED.  Called pt with these instructions, she voiced understanding. Pt given appointment for 11/11 @ 0930. Decadron e-scribed to pharmacy.

## 2016-05-31 ENCOUNTER — Ambulatory Visit: Payer: Medicare Other

## 2016-05-31 ENCOUNTER — Emergency Department (HOSPITAL_COMMUNITY): Payer: Medicare Other

## 2016-05-31 ENCOUNTER — Encounter (HOSPITAL_COMMUNITY): Payer: Self-pay | Admitting: Emergency Medicine

## 2016-05-31 ENCOUNTER — Other Ambulatory Visit: Payer: Self-pay | Admitting: Hematology and Oncology

## 2016-05-31 ENCOUNTER — Inpatient Hospital Stay (HOSPITAL_COMMUNITY)
Admission: EM | Admit: 2016-05-31 | Discharge: 2016-06-05 | DRG: 871 | Disposition: A | Discharge status: Skilled Nursing Facility | Payer: Medicare Other | Attending: Family Medicine | Admitting: Family Medicine

## 2016-05-31 DIAGNOSIS — D61818 Other pancytopenia: Secondary | ICD-10-CM

## 2016-05-31 DIAGNOSIS — C569 Malignant neoplasm of unspecified ovary: Secondary | ICD-10-CM | POA: Diagnosis present

## 2016-05-31 DIAGNOSIS — D703 Neutropenia due to infection: Secondary | ICD-10-CM | POA: Diagnosis present

## 2016-05-31 DIAGNOSIS — J90 Pleural effusion, not elsewhere classified: Secondary | ICD-10-CM | POA: Diagnosis present

## 2016-05-31 DIAGNOSIS — D6959 Other secondary thrombocytopenia: Secondary | ICD-10-CM

## 2016-05-31 DIAGNOSIS — D689 Coagulation defect, unspecified: Secondary | ICD-10-CM | POA: Diagnosis present

## 2016-05-31 DIAGNOSIS — C786 Secondary malignant neoplasm of retroperitoneum and peritoneum: Secondary | ICD-10-CM | POA: Diagnosis present

## 2016-05-31 DIAGNOSIS — E119 Type 2 diabetes mellitus without complications: Secondary | ICD-10-CM | POA: Diagnosis present

## 2016-05-31 DIAGNOSIS — Z96641 Presence of right artificial hip joint: Secondary | ICD-10-CM | POA: Diagnosis present

## 2016-05-31 DIAGNOSIS — I482 Chronic atrial fibrillation: Secondary | ICD-10-CM | POA: Diagnosis present

## 2016-05-31 DIAGNOSIS — R5081 Fever presenting with conditions classified elsewhere: Secondary | ICD-10-CM | POA: Diagnosis present

## 2016-05-31 DIAGNOSIS — E43 Unspecified severe protein-calorie malnutrition: Secondary | ICD-10-CM | POA: Diagnosis present

## 2016-05-31 DIAGNOSIS — I5032 Chronic diastolic (congestive) heart failure: Secondary | ICD-10-CM | POA: Diagnosis present

## 2016-05-31 DIAGNOSIS — A4152 Sepsis due to Pseudomonas: Principal | ICD-10-CM | POA: Diagnosis present

## 2016-05-31 DIAGNOSIS — I110 Hypertensive heart disease with heart failure: Secondary | ICD-10-CM | POA: Diagnosis present

## 2016-05-31 DIAGNOSIS — T451X5A Adverse effect of antineoplastic and immunosuppressive drugs, initial encounter: Secondary | ICD-10-CM | POA: Diagnosis present

## 2016-05-31 DIAGNOSIS — Z794 Long term (current) use of insulin: Secondary | ICD-10-CM

## 2016-05-31 DIAGNOSIS — R197 Diarrhea, unspecified: Secondary | ICD-10-CM | POA: Diagnosis not present

## 2016-05-31 DIAGNOSIS — I444 Left anterior fascicular block: Secondary | ICD-10-CM | POA: Diagnosis present

## 2016-05-31 DIAGNOSIS — D638 Anemia in other chronic diseases classified elsewhere: Secondary | ICD-10-CM

## 2016-05-31 DIAGNOSIS — D62 Acute posthemorrhagic anemia: Secondary | ICD-10-CM | POA: Diagnosis present

## 2016-05-31 DIAGNOSIS — Z87891 Personal history of nicotine dependence: Secondary | ICD-10-CM

## 2016-05-31 DIAGNOSIS — R7881 Bacteremia: Secondary | ICD-10-CM | POA: Diagnosis present

## 2016-05-31 DIAGNOSIS — B965 Pseudomonas (aeruginosa) (mallei) (pseudomallei) as the cause of diseases classified elsewhere: Secondary | ICD-10-CM | POA: Diagnosis present

## 2016-05-31 DIAGNOSIS — R109 Unspecified abdominal pain: Secondary | ICD-10-CM

## 2016-05-31 DIAGNOSIS — K922 Gastrointestinal hemorrhage, unspecified: Secondary | ICD-10-CM | POA: Diagnosis not present

## 2016-05-31 DIAGNOSIS — K921 Melena: Secondary | ICD-10-CM | POA: Diagnosis present

## 2016-05-31 DIAGNOSIS — A4189 Other specified sepsis: Secondary | ICD-10-CM

## 2016-05-31 DIAGNOSIS — Z79899 Other long term (current) drug therapy: Secondary | ICD-10-CM

## 2016-05-31 DIAGNOSIS — I4891 Unspecified atrial fibrillation: Secondary | ICD-10-CM

## 2016-05-31 DIAGNOSIS — H409 Unspecified glaucoma: Secondary | ICD-10-CM | POA: Diagnosis present

## 2016-05-31 DIAGNOSIS — D709 Neutropenia, unspecified: Secondary | ICD-10-CM | POA: Diagnosis not present

## 2016-05-31 DIAGNOSIS — Z7901 Long term (current) use of anticoagulants: Secondary | ICD-10-CM | POA: Diagnosis not present

## 2016-05-31 DIAGNOSIS — R710 Precipitous drop in hematocrit: Secondary | ICD-10-CM

## 2016-05-31 DIAGNOSIS — D702 Other drug-induced agranulocytosis: Secondary | ICD-10-CM | POA: Diagnosis present

## 2016-05-31 DIAGNOSIS — R180 Malignant ascites: Secondary | ICD-10-CM | POA: Diagnosis present

## 2016-05-31 DIAGNOSIS — D6481 Anemia due to antineoplastic chemotherapy: Secondary | ICD-10-CM

## 2016-05-31 DIAGNOSIS — Z9114 Patient's other noncompliance with medication regimen: Secondary | ICD-10-CM

## 2016-05-31 DIAGNOSIS — D708 Other neutropenia: Secondary | ICD-10-CM | POA: Diagnosis not present

## 2016-05-31 DIAGNOSIS — A419 Sepsis, unspecified organism: Secondary | ICD-10-CM | POA: Diagnosis present

## 2016-05-31 DIAGNOSIS — E7800 Pure hypercholesterolemia, unspecified: Secondary | ICD-10-CM | POA: Diagnosis present

## 2016-05-31 DIAGNOSIS — R140 Abdominal distension (gaseous): Secondary | ICD-10-CM | POA: Diagnosis not present

## 2016-05-31 DIAGNOSIS — Z6835 Body mass index (BMI) 35.0-35.9, adult: Secondary | ICD-10-CM

## 2016-05-31 LAB — CBC WITH DIFFERENTIAL/PLATELET
BASOS ABS: 0 10*3/uL (ref 0.0–0.1)
BASOS ABS: 0 10*3/uL (ref 0.0–0.1)
BASOS PCT: 0 %
Basophils Relative: 0 %
EOS ABS: 0 10*3/uL (ref 0.0–0.7)
Eosinophils Absolute: 0 10*3/uL (ref 0.0–0.7)
Eosinophils Relative: 0 %
Eosinophils Relative: 0 %
HCT: 35.7 % — ABNORMAL LOW (ref 36.0–46.0)
HEMATOCRIT: 33.1 % — AB (ref 36.0–46.0)
HEMOGLOBIN: 11.5 g/dL — AB (ref 12.0–15.0)
HEMOGLOBIN: 10.6 g/dL — AB (ref 12.0–15.0)
LYMPHS ABS: 0.1 10*3/uL — AB (ref 0.7–4.0)
LYMPHS PCT: 12 %
LYMPHS PCT: 23 %
Lymphs Abs: 0.1 10*3/uL — ABNORMAL LOW (ref 0.7–4.0)
MCH: 25.2 pg — AB (ref 26.0–34.0)
MCH: 24.8 pg — AB (ref 26.0–34.0)
MCHC: 32.2 g/dL (ref 30.0–36.0)
MCHC: 32 g/dL (ref 30.0–36.0)
MCV: 78.1 fL (ref 78.0–100.0)
MCV: 77.5 fL — ABNORMAL LOW (ref 78.0–100.0)
MONOS PCT: 76 %
MONOS PCT: 65 %
Monocytes Absolute: 0.3 10*3/uL (ref 0.1–1.0)
Monocytes Absolute: 0.3 10*3/uL (ref 0.1–1.0)
NEUTROS ABS: 0 10*3/uL — AB (ref 1.7–7.7)
NEUTROS PCT: 12 %
Neutro Abs: 0.1 10*3/uL — ABNORMAL LOW (ref 1.7–7.7)
Neutrophils Relative %: 12 %
PLATELETS: 138 10*3/uL — AB (ref 150–400)
Platelets: 122 10*3/uL — ABNORMAL LOW (ref 150–400)
RBC: 4.57 MIL/uL (ref 3.87–5.11)
RBC: 4.27 MIL/uL (ref 3.87–5.11)
RDW: 16.6 % — ABNORMAL HIGH (ref 11.5–15.5)
RDW: 16.5 % — AB (ref 11.5–15.5)
WBC: 0.5 10*3/uL — AB (ref 4.0–10.5)
WBC: 0.4 10*3/uL — CL (ref 4.0–10.5)

## 2016-05-31 LAB — COMPREHENSIVE METABOLIC PANEL
ALBUMIN: 2.7 g/dL — AB (ref 3.5–5.0)
ALK PHOS: 109 U/L (ref 38–126)
ALT: 27 U/L (ref 14–54)
ANION GAP: 10 (ref 5–15)
AST: 36 U/L (ref 15–41)
BILIRUBIN TOTAL: 1.2 mg/dL (ref 0.3–1.2)
BUN: 23 mg/dL — AB (ref 6–20)
CALCIUM: 8.2 mg/dL — AB (ref 8.9–10.3)
CO2: 26 mmol/L (ref 22–32)
CREATININE: 0.79 mg/dL (ref 0.44–1.00)
Chloride: 97 mmol/L — ABNORMAL LOW (ref 101–111)
GFR calc Af Amer: >60 mL/min (ref >60)
GFR calc non Af Amer: >60 mL/min (ref >60)
GLUCOSE: 143 mg/dL — AB (ref 65–99)
Potassium: 4.1 mmol/L (ref 3.5–5.1)
Sodium: 133 mmol/L — ABNORMAL LOW (ref 135–145)
TOTAL PROTEIN: 5.8 g/dL — AB (ref 6.5–8.1)

## 2016-05-31 LAB — PROTIME-INR
INR: >10
Prothrombin Time: 86.3 seconds — ABNORMAL HIGH (ref 11.4–15.2)
Prothrombin Time: 85.5 seconds — ABNORMAL HIGH (ref 11.4–15.2)

## 2016-05-31 LAB — GLUCOSE, CAPILLARY
Glucose-Capillary: 107 mg/dL — ABNORMAL HIGH (ref 65–99)
Glucose-Capillary: 111 mg/dL — ABNORMAL HIGH (ref 65–99)
Glucose-Capillary: 135 mg/dL — ABNORMAL HIGH (ref 65–99)

## 2016-05-31 LAB — URINALYSIS, ROUTINE W REFLEX MICROSCOPIC
GLUCOSE, UA: NEGATIVE mg/dL
HGB URINE DIPSTICK: NEGATIVE
KETONES UR: NEGATIVE mg/dL
LEUKOCYTES UA: NEGATIVE
Nitrite: NEGATIVE
PH: 6 (ref 5.0–8.0)
PROTEIN: 100 mg/dL — AB
Specific Gravity, Urine: 1.028 (ref 1.005–1.030)

## 2016-05-31 LAB — URINE MICROSCOPIC-ADD ON

## 2016-05-31 LAB — POC OCCULT BLOOD, ED: Fecal Occult Bld: POSITIVE — AB

## 2016-05-31 LAB — LIPASE, BLOOD: Lipase: 11 U/L (ref 11–51)

## 2016-05-31 LAB — ABO/RH: ABO/RH(D): A POS

## 2016-05-31 LAB — MRSA PCR SCREENING: MRSA by PCR: NEGATIVE

## 2016-05-31 LAB — CBG MONITORING, ED: Glucose-Capillary: 112 mg/dL — ABNORMAL HIGH (ref 65–99)

## 2016-05-31 MED ORDER — ONDANSETRON HCL 4 MG PO TABS: 8.0000 mg | ORAL_TABLET | Freq: Three times a day (TID) | ORAL | Status: DC | PRN

## 2016-05-31 MED ORDER — INSULIN ASPART 100 UNIT/ML ~~LOC~~ SOLN
0.0000 [IU] | SUBCUTANEOUS | Status: DC
  Administered 2016-06-01 – 2016-06-03 (×11): 1 [IU] via SUBCUTANEOUS
  Administered 2016-06-03: 2 [IU] via SUBCUTANEOUS
  Administered 2016-06-03: 1 [IU] via SUBCUTANEOUS
  Administered 2016-06-04: 2 [IU] via SUBCUTANEOUS
  Administered 2016-06-04 – 2016-06-05 (×7): 1 [IU] via SUBCUTANEOUS

## 2016-05-31 MED ORDER — DILTIAZEM HCL-DEXTROSE 100-5 MG/100ML-% IV SOLN (PREMIX)
5.0000 mg/h | INTRAVENOUS | Status: DC
  Administered 2016-05-31: 12.5 mg/h via INTRAVENOUS
  Administered 2016-06-01 (×2): 7.5 mg/h via INTRAVENOUS
  Filled 2016-05-31 (×4): qty 100

## 2016-05-31 MED ORDER — DILTIAZEM HCL ER COATED BEADS 120 MG PO CP24: 240.0000 mg | ORAL_CAPSULE | Freq: Every day | ORAL | Status: DC

## 2016-05-31 MED ORDER — DEXAMETHASONE 4 MG PO TABS
4.0000 mg | ORAL_TABLET | Freq: Two times a day (BID) | ORAL | Status: DC
  Administered 2016-05-31 – 2016-06-03 (×5): 4 mg via ORAL
  Filled 2016-05-31 (×4): qty 2
  Filled 2016-05-31 (×2): qty 1

## 2016-05-31 MED ORDER — TBO-FILGRASTIM 480 MCG/0.8ML ~~LOC~~ SOSY
480.0000 ug | PREFILLED_SYRINGE | Freq: Every day | SUBCUTANEOUS | Status: DC
Start: 2016-05-31 — End: 2016-06-02
  Administered 2016-05-31 – 2016-06-01 (×2): 480 ug via SUBCUTANEOUS
  Filled 2016-05-31 (×2): qty 0.8

## 2016-05-31 MED ORDER — PHYTONADIONE 5 MG PO TABS
5.0000 mg | ORAL_TABLET | Freq: Once | ORAL | Status: AC
  Administered 2016-05-31: 5 mg via ORAL
  Filled 2016-05-31: qty 1

## 2016-05-31 MED ORDER — PIPERACILLIN-TAZOBACTAM 3.375 G IVPB
3.3750 g | Freq: Three times a day (TID) | INTRAVENOUS | Status: DC
  Administered 2016-05-31 – 2016-06-03 (×9): 3.375 g via INTRAVENOUS
  Filled 2016-05-31 (×9): qty 50

## 2016-05-31 MED ORDER — METOPROLOL TARTRATE 25 MG PO TABS
25.0000 mg | ORAL_TABLET | Freq: Two times a day (BID) | ORAL | Status: DC
  Administered 2016-05-31 – 2016-06-04 (×8): 25 mg via ORAL
  Filled 2016-05-31 (×8): qty 1

## 2016-05-31 MED ORDER — DILTIAZEM LOAD VIA INFUSION
20.0000 mg | Freq: Once | INTRAVENOUS | Status: AC
  Administered 2016-05-31: 20 mg via INTRAVENOUS
  Filled 2016-05-31: qty 20

## 2016-05-31 MED ORDER — ONDANSETRON HCL 4 MG/2ML IJ SOLN
4.0000 mg | Freq: Four times a day (QID) | INTRAMUSCULAR | Status: DC | PRN
Start: 2016-05-31 — End: 2016-06-05

## 2016-05-31 MED ORDER — DILTIAZEM HCL-DEXTROSE 100-5 MG/100ML-% IV SOLN (PREMIX)
5.0000 mg/h | INTRAVENOUS | Status: DC
  Administered 2016-05-31: 10 mg/h via INTRAVENOUS
  Administered 2016-05-31: 5 mg/h via INTRAVENOUS
  Administered 2016-05-31: 15 mg/h via INTRAVENOUS
  Filled 2016-05-31 (×2): qty 100

## 2016-05-31 MED ORDER — SODIUM CHLORIDE 0.9 % IV SOLN: Freq: Once | INTRAVENOUS | Status: DC

## 2016-05-31 MED ORDER — SODIUM CHLORIDE 0.9 % IV BOLUS (SEPSIS)
500.0000 mL | Freq: Once | INTRAVENOUS | Status: AC
  Administered 2016-05-31: 500 mL via INTRAVENOUS

## 2016-05-31 MED ORDER — SODIUM CHLORIDE 0.9 % IV SOLN
INTRAVENOUS | Status: DC
  Administered 2016-05-31 – 2016-06-02 (×2): via INTRAVENOUS

## 2016-05-31 MED ORDER — ACETAMINOPHEN 325 MG PO TABS: 650.0000 mg | ORAL_TABLET | ORAL | Status: DC | PRN

## 2016-05-31 NOTE — Progress Notes (Signed)
CRITICAL VALUE ALERT  Critical value received:  INR >10  Date of notification:  05/31/2016  Time of notification:  1440  Critical value read back:Yes.    Nurse who received alert:  Luther Parody, RN  MD notified (1st page):  Samtani  Time of first page:  1444  MD notified (2nd page):  Time of second page:  Responding MD:  Verlon Au  Time MD responded:  [phone]

## 2016-05-31 NOTE — ED Triage Notes (Signed)
Per pt, states loose stools last night, feels she has lost control of her bowels-states she is weak from being up all night using the bathroom

## 2016-05-31 NOTE — ED Notes (Signed)
Lab delay -  The hospitalist is coming in to talk to pt.  Will wait to see if he wants to add on labs.

## 2016-05-31 NOTE — Consult Note (Signed)
[facility] CONSULT NOTE  Patient Care Team: Seward Carol, MD as PCP - General (Internal Medicine)  CHIEF COMPLAINTS/PURPOSE OF CONSULTATION:  Neutropenic fever, supratherapeutic INR, recent chemotherapy  HISTORY OF PRESENTING ILLNESS:  Yolanda James 82 y.o. female is admitted through the emergency department after presentation with bloody diarrhea. She was recently diagnosed with ovarian cancer and received first cycle of chemotherapy with carboplatin and Taxol on 05/23/2016. She was not prescribed G-CSF This patient has chronic atrial fibrillation and takes long-term anticoagulation with warfarin Over the past 3-4 days, she has very poor oral intake since treatment She denies mucositis or nausea She started to have frequent diarrhea and was noted to be slightly bloody She complained of profound weakness In the emergency department, she was noted to be pancytopenic with very high INR level. She was admitted for possible sepsis.  She has no further diarrhea since admission She complained of mild lower abdominal tenderness She has shortness of breath on minimal exertion and complained of dizziness The patient denies any recent signs or symptoms of bleeding such as spontaneous epistaxis, hematuria or hematochezia.   MEDICAL HISTORY:  Past Medical History:  Diagnosis Date  . Acute blood loss anemia   . Chronic a-fib ([place])    a. DCCV 02/05/2007. b. sotalol stopped 2012. c. managed with rate control.  . Chronic diastolic CHF (congestive heart failure) (HCC)    associated with AFIB RVR  . Essential hypertension   . Glaucoma   . Hypokalemia   . Long term current use of anticoagulant therapy   . Osteoarthritis    hands and knees, back & knees & "all over"   . Pure hypercholesterolemia   . Slow transit constipation   . Type 2 diabetes mellitus without complication, with long-term current use of insulin ([place])     SURGICAL HISTORY: Past Surgical History:  Procedure  Laterality Date  . EYE SURGERY     laser & cataracts removed & IOL both eyes, glaucoma surgery   . GANGLION CYST EXCISION Left 1965   wrist  . TOTAL HIP ARTHROPLASTY Right 09/03/2015   Procedure: TOTAL HIP ARTHROPLASTY;  Surgeon: Frederik Pear, MD;  Location: [facility];  Service: Orthopedics;  Laterality: Right;  . vaginal births     x3    SOCIAL HISTORY: Social History   Social History  . Marital status: Widowed    Spouse name: N/A  . Number of children: 3  . Years of education: N/A   Occupational History  . Retired     Educational psychologist   Social History Main Topics  . Smoking status: Former Research scientist (life sciences)  . Smokeless tobacco: Never Used  . Alcohol use No  . Drug use: No  . Sexual activity: Not on file   Other Topics Concern  . Not on file   Social History Narrative   Widowed, lives alone   Independent in ADLs       FAMILY HISTORY: Family History  Problem Relation Age of Onset  . Arrhythmia Sister   . Heart attack Sister     ALLERGIES:  has No Known Allergies.  MEDICATIONS:  Current Facility-Administered Medications  Medication Dose Route Frequency Provider Last Rate Last Dose  . 0.9 %  sodium chloride infusion   Intravenous Once Shawn C Joy, PA-C      . 0.9 %  sodium chloride infusion   Intravenous Continuous Nita Sells, MD 50 mL/hr at 05/31/16 1415    . acetaminophen (TYLENOL) tablet 650 mg  650 mg  Oral Q4H PRN Nita Sells, MD      . dexamethasone (DECADRON) tablet 4 mg  4 mg Oral BID WC Nita Sells, MD      . diltiazem (CARDIZEM CD) 24 hr capsule 240 mg  240 mg Oral Daily Nita Sells, MD      . diltiazem (CARDIZEM) 100 mg in dextrose 5% 167mL (1 mg/mL) infusion  5-15 mg/hr Intravenous Titrated Nita Sells, MD 12.5 mL/hr at 05/31/16 1445 12.5 mg/hr at 05/31/16 1445  . insulin aspart (novoLOG) injection 0-9 Units  0-9 Units Subcutaneous Q4H Nita Sells, MD      . metoprolol tartrate (LOPRESSOR) tablet 25 mg  25 mg Oral BID  Nita Sells, MD      . ondansetron (ZOFRAN) injection 4 mg  4 mg Intravenous Q6H PRN Nita Sells, MD      . ondansetron (ZOFRAN) tablet 8 mg  8 mg Oral Q8H PRN Nita Sells, MD      . piperacillin-tazobactam (ZOSYN) IVPB 3.375 g  3.375 g Intravenous Q8H Emiliano Dyer, RPH      . Tbo-Filgrastim (GRANIX) injection 480 mcg  480 mcg Subcutaneous q1800 Heath Lark, MD        REVIEW OF SYSTEMS:   Eyes: Denies blurriness of vision, double vision or watery eyes Ears, nose, mouth, throat, and face: Denies mucositis or sore throat Skin: Denies abnormal skin rashes Lymphatics: Denies new lymphadenopathy or easy bruising Neurological:Denies numbness, tingling  Behavioral/Psych: Mood is stable, no new changes  All other systems were reviewed with the patient and are negative.  PHYSICAL EXAMINATION: ECOG PERFORMANCE STATUS: 2 - Symptomatic, <50% confined to bed  Vitals:   05/31/16 1500 05/31/16 1529  BP: (!) 109/53   Pulse:    Resp: 16   Temp:  (!) 101.4 F (38.6 C)   Filed Weights   05/31/16 1355  Weight: 216 lb 4.3 oz (98.1 kg)    GENERAL:alert, no distress and comfortable. She is moderately obese and appears pale SKIN: skin color, texture, turgor are normal, no rashes or significant lesions EYES: normal, conjunctiva are pink and non-injected, sclera clear OROPHARYNX:no exudate, no erythema and lips, buccal mucosa, and tongue normal . Dry mucous membrane is noted NECK: supple, thyroid normal size, non-tender, without nodularity LYMPH:  no palpable lymphadenopathy in the cervical, axillary or inguinal LUNGS: clear to auscultation and percussion with normal breathing effort HEART: Tachycardia, irregular rate and rhythm with no murmurs with moderate bilateral ABDOMEN:abdomen soft, mild tenderness with no rebound or guarding. Her abdomen is very warm to touch Musculoskeletal:no cyanosis of digits and no clubbing  PSYCH: alert & oriented x 3 with fluent  speech NEURO: no focal motor/sensory deficits  LABORATORY DATA:  I have reviewed the data as listed Lab Results  Component Value Date   WBC 0.4 (LL) 05/31/2016   HGB 10.6 (L) 05/31/2016   HCT 33.1 (L) 05/31/2016   MCV 77.5 (L) 05/31/2016   PLT 122 (L) 05/31/2016    Recent Labs  08/27/15 1122  10/05/15 1030 11/26/15 1450 05/20/16 1509 05/28/16 0919 05/31/16 0846  NA 139  < > 139 142 137 137 133*  K 4.0  < > 4.3 5.0 4.1 3.8 4.1  CL 99*  --  99 102  --   --  97*  CO2 25  --  30 31 27 27 26   GLUCOSE 94  --  69 102* 97 104 143*  BUN 15  < > 17 21 14.5 24.6 23*  CREATININE 0.82  < >  0.72 0.81 0.8 0.7 0.79  CALCIUM 9.3  --  9.0 9.3 8.5 8.2* 8.2*  GFRNONAA >60  --   --   --   --   --  >60  GFRAA >60  --   --   --   --   --  >60  PROT  --   --   --   --  6.2* 5.7* 5.8*  ALBUMIN  --   --   --   --  2.5* 2.4* 2.7*  AST  --   < >  --   --  20 42* 36  ALT  --   < >  --   --  14 33 27  ALKPHOS  --   < >  --   --  171* 152* 109  BILITOT  --   --   --   --  0.62 0.77 1.2  < > = values in this interval not displayed.  RADIOGRAPHIC STUDIES: I have personally reviewed the radiological images as listed and agreed with the findings in the report. Dg Chest 2 View  Result Date: 05/31/2016 CLINICAL DATA:  Pt states she is "wore out". Recent chemo treatment for ovarian cancer. Denies any chest complaints. H/o CHF, Chronic a-fib, and type 2 diabetes. Former smoker. EXAM: CHEST  2 VIEW COMPARISON:  08/27/2015 FINDINGS: Small left pleural effusion. No focal consolidation. No pneumothorax. Stable cardiomediastinal silhouette. No acute osseous abnormality. IMPRESSION: Small left pleural effusion. Electronically Signed   By: Kathreen Devoid   On: 05/31/2016 13:19   Ct Abdomen Pelvis W Contrast  Result Date: 05/07/2016 CLINICAL DATA:  Abdominal fullness, bloating for 4 weeks EXAM: CT ABDOMEN AND PELVIS WITH CONTRAST TECHNIQUE: Multidetector CT imaging of the abdomen and pelvis was performed using the  standard protocol following bolus administration of intravenous contrast. CONTRAST:  17mL ISOVUE-300 IOPAMIDOL (ISOVUE-300) INJECTION 61% COMPARISON:  None. FINDINGS: Lower chest: There is small left pleural effusion. Atherosclerotic calcifications of coronary arteries. Hepatobiliary: There is perihepatic and perisplenic ascites. No focal hepatic mass. No calcified gallstones are noted within gallbladder. Small hiatal hernia. Pancreas: Enhanced pancreas is unremarkable. Spleen: Enhanced spleen is unremarkable. Adrenals/Urinary Tract: No adrenal gland mass. Enhanced kidneys are symmetrical in size. No hydronephrosis or hydroureter. Delayed renal images shows bilateral renal symmetrical excretion. Bilateral visualized proximal ureter is unremarkable. The urinary bladder is under distended limiting its assessment. Stomach/Bowel: There is no small bowel obstruction. No gastric outlet obstruction. No thickened or dilated small bowel loops. Terminal ileum is unremarkable. No pericecal inflammation. Normal appendix is partially visualized in axial image 58. No distal colonic obstruction. Moderate stool noted within rectum. Mild fecal impaction cannot be excluded. The rectum measures 6.4 cm in diameter. Vascular/Lymphatic: No aortic aneurysm. Atherosclerotic calcifications of abdominal aorta and iliac arteries. Reproductive: The the uterus is retroflexed. There is indistinctness of the posterior wall of the uterus with heterogeneous attenuation. Neoplastic process cannot be excluded. Correlation with GYN exam and MRI is recommended. There is moderate ascites bilateral paracolic gutter. There is abnormal soft tissue density in anterior mesentery with nodular appearance highly suspicious for omental caking. Metastatic disease or primary peritoneal disease cannot be excluded. Soft tissue deposits are noted right anterior subdiaphragmatic region axial image 17 the largest measures 1 cm. There are soft tissue deposits in  posterior cul-de-sac suspicious for metastatic seeding the largest measures 2.3 cm. Other: No free abdominal air. There are metallic artifacts from right hip prosthesis. Moderate pelvic ascites. Musculoskeletal: Osteopenia and degenerative changes are  noted thoracolumbar spine. Significant disc space flattening with vacuum disc phenomenon L4-L5 and L5-S1 level. There is about 5 mm anterolisthesis L5 on S1 vertebral body. IMPRESSION: 1. There is indistinctness of the posterior wall of the uterus with heterogeneous attenuation. Neoplastic process cannot be excluded. Correlation with GYN exam and MRI is recommended. There is moderate ascites bilateral paracolic gutter. There is abnormal soft tissue density in anterior mesentery with nodular appearance highly suspicious for omental caking. Metastatic disease or primary peritoneal disease cannot be excluded. Soft tissue deposits are noted right anterior subdiaphragmatic region axial image 17 the largest measures 1 cm. There are soft tissue deposits in posterior cul-de-sac suspicious for metastatic seeding the largest measures 2.3 cm. Correlation with fluid analysis and biopsy is recommended to exclude peritoneal malignancy. 2. Degenerative changes thoracolumbar spine. 3. No hydronephrosis or hydroureter. 4. Metallic artifacts from right hip prosthesis. 5. Atherosclerotic calcifications of abdominal aorta iliac arteries and coronary arteries. These results were called by telephone at the time of interpretation on 05/07/2016 at 12:10 pm to Dr. Jori Moll POLITE , who verbally acknowledged these results. Electronically Signed   By: Lahoma Crocker M.D.   On: 05/07/2016 12:11   US Paracentesis  Result Date: 05/28/2016 INDICATION: Patient with history of GYN malignancy, probable ovarian origin, recurrent ascites. Request made for therapeutic paracentesis. EXAM: ULTRASOUND GUIDED THERAPEUTIC PARACENTESIS MEDICATIONS: None. COMPLICATIONS: None immediate. PROCEDURE: Informed written  consent was obtained from the patient after a discussion of the risks, benefits and alternatives to treatment. A timeout was performed prior to the initiation of the procedure. Initial ultrasound scanning demonstrates a small amount of ascites within the right lower abdominal quadrant. The right lower abdomen was prepped and draped in the usual sterile fashion. 1% lidocaine was used for local anesthesia. Following this, a Yueh catheter was introduced. An ultrasound image was saved for documentation purposes. The paracentesis was performed. The catheter was removed and a dressing was applied. The patient tolerated the procedure well without immediate post procedural complication. FINDINGS: A total of approximately 1.5 liters of slightly hazy, amber fluid was removed. IMPRESSION: Successful ultrasound-guided therapeutic paracentesis yielding 1.5 liters of peritoneal fluid. Read by: Rowe Robert, PA-C Electronically Signed   By: Jerilynn Mages.  Shick M.D.   On: 05/28/2016 12:36   US Paracentesis  Result Date: 05/13/2016 INDICATION: Abdominal distention. Ascites. Newly found omental caking concerning for malignant process. Request diagnostic and therapeutic paracentesis. EXAM: ULTRASOUND GUIDED LEFT LOWER QUADRANT PARACENTESIS MEDICATIONS: None. COMPLICATIONS: None immediate. PROCEDURE: Informed written consent was obtained from the patient after a discussion of the risks, benefits and alternatives to treatment. A timeout was performed prior to the initiation of the procedure. Initial ultrasound scanning demonstrates a large amount of ascites within the left lower abdominal quadrant. The left lower abdomen was prepped and draped in the usual sterile fashion. 1% lidocaine with epinephrine was used for local anesthesia. Following this, a Safe-T-Centesis catheter was introduced. An ultrasound image was saved for documentation purposes. The paracentesis was performed. The catheter was removed and a dressing was applied. The  patient tolerated the procedure well without immediate post procedural complication. FINDINGS: A total of approximately 2.1 L of clear yellow fluid was removed. Samples were sent to the laboratory as requested by the clinical team. IMPRESSION: Successful ultrasound-guided paracentesis yielding 2.1 liters of peritoneal fluid. Read by: Ascencion Dike PA-C Electronically Signed   By: Aletta Edouard M.D.   On: 05/13/2016 14:50    ASSESSMENT & PLAN:  Neutropenic sepsis Most likely cause is from her  GI tract Multiple cultures are pending I recommend starting her on broad-spectrum IV antibiotics I will prescribe G-CSF support with daily CBC until [place] is greater than 1.5  Anemia secondary to chronic illness and chemotherapy She does not need transfusion unless hemoglobin is less than 8  Thrombocytopenia due to recent chemotherapy There is no contraindication to remain on aspirin/anticoagulation therapy as long as the platelet is greater than 50,000.  Supratherapeutic INR Due to poor oral intake Hold Warfarin. Agree with vitamin K and fresh frozen plasma  Atrial fibrillation with rapid with ventricular rate Likely triggered by sepsis Continue medical management. Agree with telemetry monitoring  Severe protein calorie malnutrition Recommend dietitian consult while hospitalized  Abdominal pain and bloody diarrhea Could be acute colitis/diverticulitis Treatment with appropriate antibiotics for now  Ovarian cancer Not due for treatment Continue supportive care  Discharge planning Recommend continue ICU stay  All questions were answered. The patient knows to call the clinic with any problems, questions or concerns.    Heath Lark, MD 05/31/2016 4:27 PM

## 2016-05-31 NOTE — H&P (Signed)
[facility] History and Physical  JANAAN CHAMPOUX [id number] DOB: 1931/03/05 DOA: 05/31/2016  Referring physician: ED PCP: [name], MD  Specialists: Oncology  Chief Complaint:   HPI:  82 y/o ? Gyn malignancy-Ovarian Cadiagnosed 05/07/16   on Carboplatin/Taxol  First chemo 1 week ago  Still awaiting furtherstaging ascites Afib CHad2Vasc2 score=4 diagnosed 2008 DM ty II HTn Normocytic anemia Glaucoma R hip Repari Diastolic HF assoc with Afib RVR ef 55-60%, PASP 45 mm hg  ~  1 week history of bloody stool Tells me that is not performed had been placed on iron so it is black but blood seemed to be mixed with the stool and it is watery No vomiting of LAD no nausea  She has been taking her Coumadin intermittently as has not been feeling well and not been able to tolerate by mouth because of presumed side effects from the chemotherapy with nausea vomiting as well Initially she thought that her bleeding was from hemorrhoids  Tells me that she has had a gastroenterologist see her about 3-4 years ago and she had a colonoscopy done at the time but is unable to report what this didshow  In either event because of ongoing weakness and dark stool she presented to the emergency room There she was found to be in rapid A. fib with heart rates in the 130s WBC 0.5  ANC 100 Hemoglobin 11.5 Platelet count 1:30 INR was >10 Chest x-ray showed small left pleural effusion    Review of Systems:   Past Medical History:  Diagnosis Date  . Acute blood loss anemia   . Chronic a-fib ([place])    a. DCCV 02/05/2007. b. sotalol stopped 2012. c. managed with rate control.  . Chronic diastolic CHF (congestive heart failure) (HCC)    associated with AFIB RVR  . Essential hypertension   . Glaucoma   . Hypokalemia   . Long term current use of anticoagulant therapy   . Osteoarthritis    hands and knees, back & knees & "all over"   . Pure hypercholesterolemia   . Slow transit  constipation   . Type 2 diabetes mellitus without complication, with long-term current use of insulin [facility])    Past Surgical History:  Procedure Laterality Date  . EYE SURGERY     laser & cataracts removed & IOL both eyes, glaucoma surgery   . GANGLION CYST EXCISION Left 1965   wrist  . TOTAL HIP ARTHROPLASTY Right 09/03/2015   Procedure: TOTAL HIP ARTHROPLASTY;  Surgeon: Frederik Pear, MD;  Location: [facility];  Service: Orthopedics;  Laterality: Right;  . vaginal births     x3   Social History:  Social History   Social History Narrative   Widowed, lives alone   Independent in ADLs       No Known Allergies  Family History  Problem Relation Age of Onset  . Arrhythmia Sister   . Heart attack Sister     Prior to Admission medications   Medication Sig Start Date End Date Taking? Authorizing Provider  atorvastatin (LIPITOR) 80 MG tablet TAKE 1 TABLET(80 MG) BY MOUTH DAILY 05/06/16  Yes Jettie Booze, MD  Coenzyme Q10 (CO Q 10) 100 MG CAPS Take 1 capsule by mouth daily.   Yes Historical Provider, MD  dexamethasone (DECADRON) 4 MG tablet Take 1 tablet (4 mg total) by mouth 2 (two) times daily with a meal. 05/30/16  Yes Ladell Pier, MD  diltiazem (CARDIZEM CD) 240 MG 24 hr capsule Take  1 capsule (240 mg total) by mouth daily. 11/26/15  Yes Brittainy Erie Noe, PA-C  dorzolamide (TRUSOPT) 2 % ophthalmic solution Place 1 drop into both eyes 2 (two) times daily.  03/11/16  Yes Historical Provider, MD  FREESTYLE LITE test strip every 3 (three) days. Reported on 09/11/2015 11/30/14  Yes Historical Provider, MD  furosemide (LASIX) 40 MG tablet Take 1 tablet daily. May take additional dose once weekly for swelling. 02/05/16  Yes Jettie Booze, MD  glipiZIDE (GLUCOTROL XL) 2.5 MG 24 hr tablet Take 2.5 mg by mouth 2 (two) times daily.  12/13/13  Yes Historical Provider, MD  Lancets (FREESTYLE) lancets every 3 (three) days. Reported on 09/11/2015 11/29/14  Yes Historical Provider, MD   LUMIGAN 0.01 % SOLN Place 1 drop into both eyes at bedtime.  09/28/13  Yes Historical Provider, MD  metFORMIN (GLUCOPHAGE) 500 MG tablet Take 500 mg by mouth 2 (two) times daily with a meal.  12/10/13  Yes Historical Provider, MD  metoprolol tartrate (LOPRESSOR) 25 MG tablet Take 1 tablet (25 mg total) by mouth 2 (two) times daily. 04/07/16 07/06/16 Yes Jettie Booze, MD  Omega-3 Fatty Acids (FISH OIL) 1000 MG CAPS Take 1,000 mg by mouth 2 (two) times daily.   Yes Historical Provider, MD  ondansetron (ZOFRAN) 8 MG tablet Take 1 tablet (8 mg total) by mouth every 8 (eight) hours as needed for nausea or vomiting. 05/22/16  Yes Ladell Pier, MD  pilocarpine (PILOCAR) 4 % ophthalmic solution Place 1 drop into both eyes 4 (four) times daily.  11/23/13  Yes Historical Provider, MD  polyethylene glycol (MIRALAX / GLYCOLAX) packet Take 17 g by mouth daily as needed.    Yes Historical Provider, MD  potassium chloride (MICRO-K) 10 MEQ CR capsule Take 10 mEq by mouth daily.  12/01/15  Yes Historical Provider, MD  timolol (TIMOPTIC) 0.5 % ophthalmic solution Place 1 drop into both eyes 2 (two) times daily.  03/13/16  Yes Historical Provider, MD  warfarin (COUMADIN) 5 MG tablet TAKE 1 TABLET BY MOUTH AS DIRECTED Patient taking differently: Take 2.5 mg (one-half of a tablet) on Mondays, Wednesdays and Fridays. Take 5 mg (one tablet) on all other days 01/29/16  Yes Jettie Booze, MD   Physical Exam: Vitals:   05/31/16 1110 05/31/16 1115 05/31/16 1120 05/31/16 1131  BP: 102/55 (!) 95/54 104/60 120/72  Pulse: 119 (!) 125 (!) 131   Resp: 20 17 17 21   Temp:      TempSrc:      SpO2: 95% 92% 97%     Alert oriented pleasant no apparent distress EOMI NCAT Mild JVD about 5 cm No bruit Chest is clinically clear with no added sound Abdomen is soft nontender but hugely swollen with ascites no rebound no guarding I'm not appreciating any organomegaly She has anasarca Range of motion is intact She is very  hard of hearing Otherwise is euthymic and congruent Moving all 4 limbs equally  Labs on Admission:  Basic Metabolic Panel:  Recent Labs Lab 05/28/16 0919 05/31/16 0846  NA 137 133*  K 3.8 4.1  CL  --  97*  CO2 27 26  GLUCOSE 104 143*  BUN 24.6 23*  CREATININE 0.7 0.79  CALCIUM 8.2* 8.2*  MG 1.3*  --    Liver Function Tests:  Recent Labs Lab 05/28/16 0919 05/31/16 0846  AST 42* 36  ALT 33 27  ALKPHOS 152* 109  BILITOT 0.77 1.2  PROT 5.7* 5.8*  ALBUMIN  2.4* 2.7*    Recent Labs Lab 05/31/16 0846  LIPASE 11   No results for input(s): AMMONIA in the last 168 hours. CBC:  Recent Labs Lab 05/28/16 0919 05/31/16 0900  WBC 2.3* 0.5*  NEUTROABS 1.9 0.1*  HGB 11.4* 11.5*  HCT 35.6 35.7*  MCV 77.4* 78.1  PLT 163 138*   Cardiac Enzymes: No results for input(s): CKTOTAL, CKMB, CKMBINDEX, TROPONINI in the last 168 hours.  BNP (last 3 results)  Recent Labs  11/26/15 1450  BNP 95.0    ProBNP (last 3 results) No results for input(s): PROBNP in the last 8760 hours.  CBG:  Recent Labs Lab 05/31/16 1026  GLUCAP 112*    Radiological Exams on Admission: No results found.  EKG: Independently reviewed. Atrial fibrillation rate of about 110 QRS axis is -45 with left anterior fascicular block Some ST-T wave flattening which may be rate related  Assessment/Plan   acute GI bleed probable [facility] has been consulted Patient was given vitamin K 10 mg in the emergency room I have asked emergency room to also administer FFP to completely reverse her INR We will get in and out this evening at around 8 pm asithas't been admin yet ? Need colonoscopy ?Cdiffbutnospecificfever--ro the same  Rapid atrial fibrillation with RVR on coumadin CHAd2Vasc=5 Reverse INR Patient on Cardizem 15 mics Home medications include metoprolol 25 twice a day which will be restarted I will also restart her Cardizem 240 mg CD and she has been noncompliant with meds and  hopefully we can titrate off the Gtt  Reason mellitus type II on orals hold glipizide 2.5, metformin 500 twice a day Check a blood sugars every 4 hourly Keep nothing by mouth until seen by cardiology--have clears is okay  Recent diagnosis of gynecological malignancy Malignant ascites Continue Decadron 4 mg twice a day Continue Zofran 8 mg every 8 hourly for nausea and have placed on Phenergan as well We'll hold off on Lasix as there is limited evidence for this in setting of ascites secondary to malignancy Staging is to be performed by oncologist will be seeing the patient in consult Dr. Brandon Melnick called by ED  Severe neutropenia On carboplatin and Taxol ANC is 100 Start neutropenic precautions Would not cover at this stage with antibiotics unless oncology feels differently Might benefit from Neupogen as per Hemtologist   FULL CODE SDU D/w daughter  If 7PM-7AM, please contact night-coverage [email or web address] Password [email or web address] 05/31/2016, 12:42 PM

## 2016-05-31 NOTE — Progress Notes (Signed)
CRITICAL VALUE ALERT  Critical value received:  WBC 0.4 Date of notification:  05/31/2016  Time of notification:  1440  Critical value read back:Yes.    Nurse who received alert:  Luther Parody, RN  MD notified (1st page):  Samtani  Time of first page:  1444  MD notified (2nd page):  Time of second page:  Responding MD:  Verlon Au  Time MD responded:  [phone]

## 2016-05-31 NOTE — ED Notes (Signed)
RN notified of abnormal lab 

## 2016-05-31 NOTE — ED Notes (Signed)
Family at bedside.  Pt resting w/no complaints.  Call bell in reach.

## 2016-05-31 NOTE — ED Provider Notes (Signed)
Pt seen and evaluated.  D/W S.Joy PA-C.  Pt with bloody stools and anticoagulated.  Neutropenic and coagulopathic.  No AP, No fever. D/W oncology by PA.  Agree with assessment and plan.  Patient with benign abdomen. Stable vitals.   Tanna Furry, MD 05/31/16 1344

## 2016-05-31 NOTE — ED Notes (Signed)
Informed MD and nurse regarding critial lab value for INR 10.45 per lab department

## 2016-05-31 NOTE — ED Notes (Signed)
RN starting line will collect labs 

## 2016-05-31 NOTE — Progress Notes (Signed)
Pharmacy Antibiotic Note  Yolanda James is a 82 y.o. female with ovarian cancer admitted on 05/31/2016 with bloody diarrhea.  Pharmacy has been consulted for Zosyn dosing for intra-abdominal infection in the setting of neutropenia.  Plan:  Zosyn 3.375gm IV q8h (4hr extended infusions)  Follow up renal function & cultures  Height: 5\' 6"  (167.6 cm) Weight: 216 lb 4.3 oz (98.1 kg) IBW/kg (Calculated) : 59.3  Temp (24hrs), Avg:99.5 F (37.5 C), Min:98.3 F (36.8 C), Max:101.4 F (38.6 C)   Recent Labs Lab 05/28/16 0919 05/28/16 0919 05/31/16 0846 05/31/16 0900 05/31/16 1334  WBC 2.3*  --   --  0.5* 0.4*  CREATININE  --  0.7 0.79  --   --     Estimated Creatinine Clearance: 62.9 mL/min (by C-G formula based on SCr of 0.79 mg/dL).    No Known Allergies  Antimicrobials this admission: 11/11 Zosyn >>  Dose adjustments this admission: ---  Microbiology results: 11/11 BCx: sent 11/11 UCx: sent  11/11 GI panel: sent  11/11 C.diff: sent 11/11 MRSA PCR: sent  Thank you for allowing pharmacy to be a part of this patient's care.  Peggyann Juba, PharmD, BCPS Pager: [phone] 05/31/2016 3:43 PM

## 2016-05-31 NOTE — ED Provider Notes (Signed)
[facility] DEPT Provider Note   CSN: [id number] Arrival date & time: 05/31/16  0803     History   Chief Complaint Chief Complaint  Patient presents with  . Diarrhea    HPI Yolanda James is a 82 y.o. female.  HPI   Yolanda James is a 82 y.o. female, with a history of Ovarian cancer, CHF, A. fib, and DM, presenting to the ED with diarrhea beginning last night. Patient also endorses hematochezia and generalized weakness for the last several days. Endorses at least three instances of loose stools since last night. Patient has been newly diagnosed with ovarian cancer. Patient does not have a port for chemotherapy.  Denies fever/chills, nausea/vomiting, abdominal pain, chest pain, shortness of breath, or any other complaints. No recent travel, recent antibiotic use, or laxative use.    Past Medical History:  Diagnosis Date  . Acute blood loss anemia   . Chronic a-fib ([place])    a. DCCV 02/05/2007. b. sotalol stopped 2012. c. managed with rate control.  . Chronic diastolic CHF (congestive heart failure) (HCC)    associated with AFIB RVR  . Essential hypertension   . Glaucoma   . Hypokalemia   . Long term current use of anticoagulant therapy   . Osteoarthritis    hands and knees, back & knees & "all over"   . Pure hypercholesterolemia   . Slow transit constipation   . Type 2 diabetes mellitus without complication, with long-term current use of insulin [facility])     Patient Active Problem List   Diagnosis Date Noted  . Bloody diarrhea 05/31/2016  . Diarrhea 05/28/2016  . Hypomagnesemia 05/28/2016  . Ovarian cancer ([place]) 05/15/2016  . Ascites 05/15/2016  . Carcinomatosis ([place]) 05/15/2016  . Bilateral lower extremity edema 11/26/2015  . Arthritis of right hip 09/03/2015  . Primary osteoarthritis of right hip 09/02/2015  . Essential hypertension, benign   . A-fib ([place])   . Pure hypercholesterolemia   . Encounter for therapeutic drug monitoring 08/18/2013  . Chronic  atrial fibrillation ([place]) 05/26/2013    Past Surgical History:  Procedure Laterality Date  . EYE SURGERY     laser & cataracts removed & IOL both eyes, glaucoma surgery   . GANGLION CYST EXCISION Left 1965   wrist  . TOTAL HIP ARTHROPLASTY Right 09/03/2015   Procedure: TOTAL HIP ARTHROPLASTY;  Surgeon: Frederik Pear, MD;  Location: [facility];  Service: Orthopedics;  Laterality: Right;  . vaginal births     x3    OB History    No data available       Home Medications    Prior to Admission medications   Medication Sig Start Date End Date Taking? Authorizing Provider  atorvastatin (LIPITOR) 80 MG tablet TAKE 1 TABLET(80 MG) BY MOUTH DAILY 05/06/16  Yes Jettie Booze, MD  Coenzyme Q10 (CO Q 10) 100 MG CAPS Take 1 capsule by mouth daily.   Yes Historical Provider, MD  dexamethasone (DECADRON) 4 MG tablet Take 1 tablet (4 mg total) by mouth 2 (two) times daily with a meal. 05/30/16  Yes Ladell Pier, MD  diltiazem (CARDIZEM CD) 240 MG 24 hr capsule Take 1 capsule (240 mg total) by mouth daily. 11/26/15  Yes Brittainy Erie Noe, PA-C  dorzolamide (TRUSOPT) 2 % ophthalmic solution Place 1 drop into both eyes 2 (two) times daily.  03/11/16  Yes Historical Provider, MD  FREESTYLE LITE test strip every 3 (three) days. Reported on 09/11/2015 11/30/14  Yes Historical Provider,  MD  furosemide (LASIX) 40 MG tablet Take 1 tablet daily. May take additional dose once weekly for swelling. 02/05/16  Yes Jettie Booze, MD  glipiZIDE (GLUCOTROL XL) 2.5 MG 24 hr tablet Take 2.5 mg by mouth 2 (two) times daily.  12/13/13  Yes Historical Provider, MD  Lancets (FREESTYLE) lancets every 3 (three) days. Reported on 09/11/2015 11/29/14  Yes Historical Provider, MD  LUMIGAN 0.01 % SOLN Place 1 drop into both eyes at bedtime.  09/28/13  Yes Historical Provider, MD  metFORMIN (GLUCOPHAGE) 500 MG tablet Take 500 mg by mouth 2 (two) times daily with a meal.  12/10/13  Yes Historical Provider, MD  metoprolol tartrate  (LOPRESSOR) 25 MG tablet Take 1 tablet (25 mg total) by mouth 2 (two) times daily. 04/07/16 07/06/16 Yes Jettie Booze, MD  Omega-3 Fatty Acids (FISH OIL) 1000 MG CAPS Take 1,000 mg by mouth 2 (two) times daily.   Yes Historical Provider, MD  ondansetron (ZOFRAN) 8 MG tablet Take 1 tablet (8 mg total) by mouth every 8 (eight) hours as needed for nausea or vomiting. 05/22/16  Yes Ladell Pier, MD  pilocarpine (PILOCAR) 4 % ophthalmic solution Place 1 drop into both eyes 4 (four) times daily.  11/23/13  Yes Historical Provider, MD  polyethylene glycol (MIRALAX / GLYCOLAX) packet Take 17 g by mouth daily as needed.    Yes Historical Provider, MD  potassium chloride (MICRO-K) 10 MEQ CR capsule Take 10 mEq by mouth daily.  12/01/15  Yes Historical Provider, MD  timolol (TIMOPTIC) 0.5 % ophthalmic solution Place 1 drop into both eyes 2 (two) times daily.  03/13/16  Yes Historical Provider, MD  warfarin (COUMADIN) 5 MG tablet TAKE 1 TABLET BY MOUTH AS DIRECTED Patient taking differently: Take 2.5 mg (one-half of a tablet) on Mondays, Wednesdays and Fridays. Take 5 mg (one tablet) on all other days 01/29/16  Yes Jettie Booze, MD    Family History Family History  Problem Relation Age of Onset  . Arrhythmia Sister   . Heart attack Sister     Social History Social History  Substance Use Topics  . Smoking status: Former Research scientist (life sciences)  . Smokeless tobacco: Never Used  . Alcohol use No     Allergies   Patient has no known allergies.   Review of Systems Review of Systems  Constitutional: Negative for chills, diaphoresis and fever.  Respiratory: Negative for shortness of breath.   Cardiovascular: Negative for chest pain and leg swelling.  Gastrointestinal: Positive for blood in stool and diarrhea. Negative for abdominal pain, nausea and vomiting.  Neurological: Positive for weakness (generalized). Negative for dizziness, syncope and light-headedness.  All other systems reviewed and are  negative.    Physical Exam Updated Vital Signs BP 142/79 (BP Location: Right Arm)   Pulse 98   Temp 98.5 F (36.9 C) (Oral)   Resp 18   SpO2 98%   Physical Exam  Constitutional: She appears well-developed and well-nourished. No distress.  HENT:  Head: Normocephalic and atraumatic.  Mouth/Throat: Oropharynx is clear and moist.  Eyes: Conjunctivae are normal.  Neck: Neck supple.  Cardiovascular: Intact distal pulses.  An irregularly irregular rhythm present. Tachycardia present.   Pulmonary/Chest: [place] normal and breath sounds normal. No respiratory distress.  Abdominal: Soft. There is no tenderness. There is no guarding.  Genitourinary: Rectal exam shows guaiac positive stool.  Genitourinary Comments: Evidence of external hemorrhoids. Questionable scant gross blood. No stool burden. No rectal tenderness. RN, Jackelyn Poling, served as Producer, television/film/video  during the rectal exam.  Musculoskeletal: She exhibits no edema.  Lymphadenopathy:    She has no cervical adenopathy.  Neurological: She is alert.  Skin: Skin is warm and dry. She is not diaphoretic.  Psychiatric: She has a normal mood and affect. Her behavior is normal.  Nursing note and vitals reviewed.    ED Treatments / Results  Labs (all labs ordered are listed, but only abnormal results are displayed) Labs Reviewed  COMPREHENSIVE METABOLIC PANEL - Abnormal; Notable for the following:       Result Value   Sodium 133 (*)    Chloride 97 (*)    Glucose, Bld 143 (*)    BUN 23 (*)    Calcium 8.2 (*)    Total Protein 5.8 (*)    Albumin 2.7 (*)    All other components within normal limits  URINALYSIS, ROUTINE W REFLEX MICROSCOPIC (NOT AT [facility]) - Abnormal; Notable for the following:    Color, Urine AMBER (*)    APPearance CLOUDY (*)    Bilirubin Urine SMALL (*)    Protein, ur 100 (*)    All other components within normal limits  PROTIME-INR - Abnormal; Notable for the following:    Prothrombin Time 86.3 (*)    INR >10.00 (*)     All other components within normal limits  CBC WITH DIFFERENTIAL/PLATELET - Abnormal; Notable for the following:    WBC 0.5 (*)    Hemoglobin 11.5 (*)    HCT 35.7 (*)    MCH 25.2 (*)    RDW 16.6 (*)    Platelets 138 (*)    Neutro Abs 0.1 (*)    Lymphs Abs 0.1 (*)    All other components within normal limits  URINE MICROSCOPIC-ADD ON - Abnormal; Notable for the following:    Squamous Epithelial / LPF 0-5 (*)    Bacteria, UA FEW (*)    Casts GRANULAR CAST (*)    All other components within normal limits  POC OCCULT BLOOD, ED - Abnormal; Notable for the following:    Fecal Occult Bld POSITIVE (*)    All other components within normal limits  CBG MONITORING, ED - Abnormal; Notable for the following:    Glucose-Capillary 112 (*)    All other components within normal limits  C DIFFICILE QUICK SCREEN W PCR REFLEX  GASTROINTESTINAL PANEL BY PCR, STOOL (REPLACES STOOL CULTURE)  CULTURE, BLOOD (ROUTINE X 2)  CULTURE, BLOOD (ROUTINE X 2)  URINE CULTURE  LIPASE, BLOOD  CBC WITH DIFFERENTIAL/PLATELET  PROTIME-INR  TYPE AND SCREEN  PREPARE FRESH FROZEN PLASMA    WBC  Date Value Ref Range Status  05/31/2016 0.5 (LL) 4.0 - 10.5 K/uL Final    Comment:    CRITICAL RESULT CALLED TO, READ BACK BY AND VERIFIED WITH: WEST,S RN [id number] [place],N   05/28/2016 2.3 (L) 3.9 - 10.3 10e3/uL Final  05/20/2016 10.2 3.9 - 10.3 10e3/uL Final  09/07/2015 9.0 10^3/mL Final  09/05/2015 17.4 (H) 4.0 - 10.5 K/uL Final  09/05/2015 17.4 10^3/mL Final  09/04/2015 12.7 (H) 4.0 - 10.5 K/uL Final  08/27/2015 9.9 4.0 - 10.5 K/uL Final    EKG  EKG Interpretation  Date/Time:  Saturday May 31 2016 08:19:42 EST Ventricular Rate:  154 PR Interval:    QRS Duration: 114 QT Interval:  302 QTC Calculation: 484 R Axis:   -45 Text Interpretation:  Atrial fibrillation with rapid V-rate LAD, consider left anterior fascicular block Low voltage, precordial leads Anteroseptal infarct, old Nonspecific  T  abnormalities, lateral leads Confirmed by Jeneen Rinks  MD, [place] ([id number]) on 05/31/2016 8:29:58 AM Also confirmed by Jeneen Rinks  MD, [place] ([id number]), editor [place]  CCT, [place] ([id number])  on 05/31/2016 9:29:59 AM       Radiology Dg Chest 2 View  Result Date: 05/31/2016 CLINICAL DATA:  Pt states she is "wore out". Recent chemo treatment for ovarian cancer. Denies any chest complaints. H/o CHF, Chronic a-fib, and type 2 diabetes. Former smoker. EXAM: CHEST  2 VIEW COMPARISON:  08/27/2015 FINDINGS: Small left pleural effusion. No focal consolidation. No pneumothorax. Stable cardiomediastinal silhouette. No acute osseous abnormality. IMPRESSION: Small left pleural effusion. Electronically Signed   By: Kathreen Devoid   On: 05/31/2016 13:19    Procedures Procedures (including critical care time)  CRITICAL CARE Performed by: Townes Fuhs C Colleena Kurtenbach Total critical care time: 60 minutes Critical care time was exclusive of separately billable procedures and treating other patients. Critical care was necessary to treat or prevent imminent or life-threatening deterioration. Critical care was time spent personally by me on the following activities: development of treatment plan with patient and/or surrogate as well as nursing, discussions with consultants, evaluation of patient's response to treatment, examination of patient, obtaining history from patient or surrogate, ordering and performing treatments and interventions, ordering and review of laboratory studies, ordering and review of radiographic studies, pulse oximetry and re-evaluation of patient's condition.  Medications Ordered in ED Medications  diltiazem (CARDIZEM) 1 mg/mL load via infusion 20 mg (20 mg Intravenous Bolus from Bag 05/31/16 0907)    And  diltiazem (CARDIZEM) 100 mg in dextrose 5% 174mL (1 mg/mL) infusion (15 mg/hr Intravenous New Bag/Given 05/31/16 1134)  0.9 %  sodium chloride infusion (not administered)  sodium chloride 0.9 % bolus 500 mL (0 mLs  Intravenous Stopped 05/31/16 1027)  diltiazem (CARDIZEM) 1 mg/mL load via infusion 20 mg (20 mg Intravenous Bolus from Bag 05/31/16 1046)  phytonadione (VITAMIN K) tablet 5 mg (5 mg Oral Given 05/31/16 1132)     Initial Impression / Assessment and Plan / ED Course  I have reviewed the triage vital signs and the nursing notes.  Pertinent labs & imaging results that were available during my care of the patient were reviewed by me and considered in my medical decision making (see chart for details).  Clinical Course      Patient presents with bloody diarrhea that she states began last night. Patient was noted to be in A. fib with RVR with rate 140-160. Patient appears to be asymptomatic to this at this time. No signs of hemodynamic instability currently. INR noted to be >10. Vitamin K given to address this issue. Patient also noted to be neutropenic, even less than 3 days ago. 12:26 PM Spoke with Dr. Alvy Bimler, medical oncologist. States she will come see the patient and will do a full consult. Recommends admission, blood cultures, urine culture, CXR, vitamin K, and IVF. No ABX unless she develops fever. No CT unless she has pain.  12:48 PM Spoke with Dr. Verlon Au, hospitalist, who agreed to admit the patient to inpatient stepdown. Requested we start FFP here in the ED.   Findings and plan of care discussed with Tanna Furry, MD. Dr. Jeneen Rinks personally evaluated and examined this patient.   Pt states her first Chemotherapy treatment (carboplatin/Taxol) for her ovarian cancer was November 3. Patient is followed by Dr. Betsy Coder and Dr. Janie Morning for her ovarian cancer. Chart review notes that [facility], blood in the stool, and diarrhea. Patient was also noted to have undergone a paracentesis with 1.5 L of fluid drawn off.  Vitals:   05/31/16 1110 05/31/16 1115 05/31/16 1120 05/31/16 1131  BP: 102/55 (!) 95/54 104/60 120/72  Pulse:  119 (!) 125 (!) 131   Resp: 20 17 17 21   Temp:      TempSrc:      SpO2: 95% 92% 97%    Vitals:   05/31/16 1120 05/31/16 1131 05/31/16 1200 05/31/16 1300  BP: 104/60 120/72 132/71 111/80  Pulse: (!) 131  (!) 144   Resp: 17 21 21 19   Temp:      TempSrc:      SpO2: 97%  93%      Final Clinical Impressions(s) / ED Diagnoses   Final diagnoses:  Bloody diarrhea  Atrial fibrillation with RVR (HCC)  Coagulopathy (HCC)    New Prescriptions New Prescriptions   No medications on file     Layla Maw 05/31/16 1349    Tanna Furry, MD 06/18/16 1035

## 2016-06-01 ENCOUNTER — Inpatient Hospital Stay (HOSPITAL_COMMUNITY): Payer: Medicare Other

## 2016-06-01 ENCOUNTER — Encounter (HOSPITAL_COMMUNITY): Payer: Self-pay

## 2016-06-01 DIAGNOSIS — D702 Other drug-induced agranulocytosis: Secondary | ICD-10-CM

## 2016-06-01 DIAGNOSIS — D709 Neutropenia, unspecified: Secondary | ICD-10-CM

## 2016-06-01 DIAGNOSIS — R5081 Fever presenting with conditions classified elsewhere: Secondary | ICD-10-CM

## 2016-06-01 DIAGNOSIS — K922 Gastrointestinal hemorrhage, unspecified: Secondary | ICD-10-CM

## 2016-06-01 DIAGNOSIS — D696 Thrombocytopenia, unspecified: Secondary | ICD-10-CM

## 2016-06-01 LAB — CBC WITH DIFFERENTIAL/PLATELET
BASOS ABS: 0 10*3/uL (ref 0.0–0.1)
Basophils Relative: 0 %
EOS ABS: 0 10*3/uL (ref 0.0–0.7)
Eosinophils Relative: 0 %
HCT: 28 % — ABNORMAL LOW (ref 36.0–46.0)
HEMOGLOBIN: 9.2 g/dL — AB (ref 12.0–15.0)
LYMPHS PCT: 22 %
Lymphs Abs: 0.2 10*3/uL — ABNORMAL LOW (ref 0.7–4.0)
MCH: 24.9 pg — ABNORMAL LOW (ref 26.0–34.0)
MCHC: 32.9 g/dL (ref 30.0–36.0)
MCV: 75.9 fL — ABNORMAL LOW (ref 78.0–100.0)
Monocytes Absolute: 0.5 10*3/uL (ref 0.1–1.0)
Monocytes Relative: 52 %
NEUTROS ABS: 0.3 10*3/uL — AB (ref 1.7–7.7)
Neutrophils Relative %: 26 %
Platelets: 86 10*3/uL — ABNORMAL LOW (ref 150–400)
RBC: 3.69 MIL/uL — ABNORMAL LOW (ref 3.87–5.11)
RDW: 16.3 % — AB (ref 11.5–15.5)
WBC: 1 10*3/uL — CL (ref 4.0–10.5)

## 2016-06-01 LAB — CBC
HCT: 27.7 % — ABNORMAL LOW (ref 36.0–46.0)
HEMATOCRIT: 28.1 % — AB (ref 36.0–46.0)
Hemoglobin: 9.2 g/dL — ABNORMAL LOW (ref 12.0–15.0)
Hemoglobin: 9.2 g/dL — ABNORMAL LOW (ref 12.0–15.0)
MCH: 24.9 pg — AB (ref 26.0–34.0)
MCH: 25.5 pg — AB (ref 26.0–34.0)
MCHC: 32.7 g/dL (ref 30.0–36.0)
MCHC: 33.2 g/dL (ref 30.0–36.0)
MCV: 76.2 fL — AB (ref 78.0–100.0)
MCV: 76.7 fL — ABNORMAL LOW (ref 78.0–100.0)
PLATELETS: 87 10*3/uL — AB (ref 150–400)
Platelets: 79 10*3/uL — ABNORMAL LOW (ref 150–400)
RBC: 3.69 MIL/uL — ABNORMAL LOW (ref 3.87–5.11)
RBC: 3.61 MIL/uL — ABNORMAL LOW (ref 3.87–5.11)
RDW: 16.3 % — AB (ref 11.5–15.5)
RDW: 16.2 % — AB (ref 11.5–15.5)
WBC: 1.4 10*3/uL — CL (ref 4.0–10.5)
WBC: 2.4 10*3/uL — ABNORMAL LOW (ref 4.0–10.5)

## 2016-06-01 LAB — PREPARE FRESH FROZEN PLASMA: UNIT DIVISION: 0

## 2016-06-01 LAB — GASTROINTESTINAL PANEL BY PCR, STOOL (REPLACES STOOL CULTURE)

## 2016-06-01 LAB — BLOOD CULTURE ID PANEL (REFLEXED)
Acinetobacter baumannii: NOT DETECTED
CANDIDA GLABRATA: NOT DETECTED
CANDIDA KRUSEI: NOT DETECTED
CANDIDA PARAPSILOSIS: NOT DETECTED
CARBAPENEM RESISTANCE: NOT DETECTED
Candida albicans: NOT DETECTED
Candida tropicalis: NOT DETECTED
Enterobacter cloacae complex: NOT DETECTED
Enterobacteriaceae species: NOT DETECTED
Enterococcus species: NOT DETECTED
Escherichia coli: NOT DETECTED
Haemophilus influenzae: NOT DETECTED
KLEBSIELLA OXYTOCA: NOT DETECTED
KLEBSIELLA PNEUMONIAE: NOT DETECTED
Listeria monocytogenes: NOT DETECTED
Neisseria meningitidis: NOT DETECTED
Proteus species: NOT DETECTED
Pseudomonas aeruginosa: DETECTED — AB
SERRATIA MARCESCENS: NOT DETECTED
STAPHYLOCOCCUS AUREUS BCID: NOT DETECTED
STAPHYLOCOCCUS SPECIES: NOT DETECTED
STREPTOCOCCUS PYOGENES: NOT DETECTED
Streptococcus agalactiae: NOT DETECTED
Streptococcus pneumoniae: NOT DETECTED
Streptococcus species: NOT DETECTED

## 2016-06-01 LAB — PROTIME-INR
INR: 1.92
INR: 2.05
PROTHROMBIN TIME: 23.5 s — AB (ref 11.4–15.2)
Prothrombin Time: 22.2 seconds — ABNORMAL HIGH (ref 11.4–15.2)

## 2016-06-01 LAB — C DIFFICILE QUICK SCREEN W PCR REFLEX
C Diff antigen: NEGATIVE
C Diff interpretation: NOT DETECTED
C Diff toxin: NEGATIVE

## 2016-06-01 LAB — BASIC METABOLIC PANEL
ANION GAP: 9 (ref 5–15)
BUN: 25 mg/dL — ABNORMAL HIGH (ref 6–20)
CALCIUM: 7.8 mg/dL — AB (ref 8.9–10.3)
CO2: 25 mmol/L (ref 22–32)
Chloride: 97 mmol/L — ABNORMAL LOW (ref 101–111)
Creatinine, Ser: 0.79 mg/dL (ref 0.44–1.00)
GLUCOSE: 126 mg/dL — AB (ref 65–99)
Potassium: 3.7 mmol/L (ref 3.5–5.1)
SODIUM: 131 mmol/L — AB (ref 135–145)

## 2016-06-01 LAB — GLUCOSE, CAPILLARY
GLUCOSE-CAPILLARY: 124 mg/dL — AB (ref 65–99)
GLUCOSE-CAPILLARY: 116 mg/dL — AB (ref 65–99)
GLUCOSE-CAPILLARY: 123 mg/dL — AB (ref 65–99)
GLUCOSE-CAPILLARY: 104 mg/dL — AB (ref 65–99)
Glucose-Capillary: 115 mg/dL — ABNORMAL HIGH (ref 65–99)
Glucose-Capillary: 139 mg/dL — ABNORMAL HIGH (ref 65–99)

## 2016-06-01 MED ORDER — CIPROFLOXACIN IN D5W 400 MG/200ML IV SOLN
400.0000 mg | Freq: Two times a day (BID) | INTRAVENOUS | Status: DC
  Administered 2016-06-01 – 2016-06-03 (×4): 400 mg via INTRAVENOUS
  Filled 2016-06-01 (×4): qty 200

## 2016-06-01 MED ORDER — SODIUM CHLORIDE 0.9 % IV BOLUS (SEPSIS)
1000.0000 mL | Freq: Once | INTRAVENOUS | Status: AC
  Administered 2016-06-01: 1000 mL via INTRAVENOUS

## 2016-06-01 MED ORDER — DILTIAZEM HCL ER COATED BEADS 240 MG PO CP24
240.0000 mg | ORAL_CAPSULE | Freq: Every day | ORAL | Status: DC
  Administered 2016-06-01 – 2016-06-05 (×5): 240 mg via ORAL
  Filled 2016-06-01: qty 1
  Filled 2016-06-01: qty 2
  Filled 2016-06-01: qty 1
  Filled 2016-06-01: qty 2
  Filled 2016-06-01: qty 1

## 2016-06-01 MED ORDER — DEXTROSE 5 % IV SOLN: 2.0000 g | Freq: Three times a day (TID) | INTRAVENOUS | Status: DC

## 2016-06-01 NOTE — Progress Notes (Signed)
PHARMACY - PHYSICIAN COMMUNICATION CRITICAL VALUE ALERT - BLOOD CULTURE IDENTIFICATION (BCID)  Results for orders placed or performed during the hospital encounter of 05/31/16  Blood Culture ID Panel (Reflexed) (Collected: 05/31/2016  1:32 PM)  Result Value Ref Range   Enterococcus species NOT DETECTED NOT DETECTED   Listeria monocytogenes NOT DETECTED NOT DETECTED   Staphylococcus species NOT DETECTED NOT DETECTED   Staphylococcus aureus NOT DETECTED NOT DETECTED   Streptococcus species NOT DETECTED NOT DETECTED   Streptococcus agalactiae NOT DETECTED NOT DETECTED   Streptococcus pneumoniae NOT DETECTED NOT DETECTED   Streptococcus pyogenes NOT DETECTED NOT DETECTED   Acinetobacter baumannii NOT DETECTED NOT DETECTED   Enterobacteriaceae species NOT DETECTED NOT DETECTED   Enterobacter cloacae complex NOT DETECTED NOT DETECTED   Escherichia coli NOT DETECTED NOT DETECTED   Klebsiella oxytoca NOT DETECTED NOT DETECTED   Klebsiella pneumoniae NOT DETECTED NOT DETECTED   Proteus species NOT DETECTED NOT DETECTED   Serratia marcescens NOT DETECTED NOT DETECTED   Carbapenem resistance NOT DETECTED NOT DETECTED   Haemophilus influenzae NOT DETECTED NOT DETECTED   Neisseria meningitidis NOT DETECTED NOT DETECTED   Pseudomonas aeruginosa DETECTED (A) NOT DETECTED   Candida albicans NOT DETECTED NOT DETECTED   Candida glabrata NOT DETECTED NOT DETECTED   Candida krusei NOT DETECTED NOT DETECTED   Candida parapsilosis NOT DETECTED NOT DETECTED   Candida tropicalis NOT DETECTED NOT DETECTED   Name of physician (or Provider) Contacted: Dr. Alvy Bimler  Changes to prescribed antibiotics required: Continue Zosyn, Add Ciprofloxacin for double coverage for Pseudomonas in neutropenic patient.  Yolanda James 06/01/2016  1:08 PM

## 2016-06-01 NOTE — Progress Notes (Signed)
[facility] PROGRESS NOTE    Progress Note  Yolanda James  [id number] DOB: 03/19/1931 DOA: 05/31/2016 PCP: [name], MD     Brief Narrative:   Yolanda James is an 82 y.o. female Past medical history of ovarian ca On chemotherapy, A. Fib on chronic Coumadin and di. Patient relates hematochezia gene  Assessment/Plan:   Acute lower GI bleed/acute blood loss anemia: Your GI has been consulted, she recieved fresh frozen plasma, her INRIs down to 2.  GI has already been consulted as per admitting physician. Continue clear liquid diet check orthostatics, her baseline hemoglobin is 13 now it's 9. His symptomatic we'll transfuse 1 unit of packed red blood cells. Continue check CBCs every 12 hours. Platelets are still greater than 50,000 continue to monitor closely.  Thrombocytopenia: Likely due to chemotherapy, if platelets go below 50,000 transfuse platelets. Continue to trend.  A. fib with RVR: On diltiazem drip, his heart rate now is controlling the 100s, question if intravascular volume is driving her RVR all with her neutropenic fever. We'll start her on IV fluids and bolus of normal saline. She does not have a degree of heart failure as per echo on May 2017.  Neutropenic fever: I will cont empiric antibiotics, continue Neupogen. Her ANC is slowly coming up. Has resolved, will continue monitor ANC. Blood cultures are pending.  Diabetes mellitus type 2: Agree withholding oral hypoglycemic agents. Continue on clear liquid diet and sliding scale insulin.  Recent diagnosis of ovarian cancer: Continue Decadron twice a day and Zofran. Oncology was consulted recommended Neupogen  Drug-induced neutropenia ([place]) Continue Neupogen. CBCs daily.  Severe protein caloric malnutrition: Continue ensure 3 times a day.  Abdominal pain: No resolved, check abdominal ultrasound as she has her uterus and empiric antibiotics to rule out cholecystitis.  DVT prophylaxis:  SCD Family Communication:none Disposition Plan/Barrier to D/C: unable to determine Code Status:     Code Status Orders        Start     Ordered   05/31/16 1426  Full code  Continuous     05/31/16 1425    Code Status History    Date Active Date Inactive Code Status Order ID Comments User Context   This patient has a current code status but no historical code status.    Advance Directive Documentation   Flowsheet Row Most Recent Value  Type of Advance Directive  Healthcare Power of Attorney  Pre-existing out of facility DNR order (yellow form or pink MOST form)  No data  "MOST" Form in Place?  No data        IV Access:    Peripheral IV   Procedures and diagnostic studies:   Dg Chest 2 View  Result Date: 05/31/2016 CLINICAL DATA:  Pt states she is "wore out". Recent chemo treatment for ovarian cancer. Denies any chest complaints. H/o CHF, Chronic a-fib, and type 2 diabetes. Former smoker. EXAM: CHEST  2 VIEW COMPARISON:  08/27/2015 FINDINGS: Small left pleural effusion. No focal consolidation. No pneumothorax. Stable cardiomediastinal silhouette. No acute osseous abnormality. IMPRESSION: Small left pleural effusion. Electronically Signed   By: Kathreen Devoid   On: 05/31/2016 13:19     Medical Consultants:    None.  Anti-Infectives:   Zosyn  Subjective:    [name] relates no abdominal pain she relates she is thirsty. Feels better than yesterday.  Objective:    Vitals:   06/01/16 0200 06/01/16 0300 06/01/16 0400 06/01/16 0500  BP: (!) 94/49 (!) 101/55 Marland Kitchen)  95/55 (!) 92/56  Pulse:      Resp: 13 10 14 10   Temp:   98.3 F (36.8 C)   TempSrc:   Axillary   SpO2: 94% 93% 94% 95%  Weight:      Height:        Intake/Output Summary (Last 24 hours) at 06/01/16 [id number] Last data filed at 06/01/16 0500  Gross per 24 hour  Intake           735.35 ml  Output              350 ml  Net           385.35 ml   Filed Weights   05/31/16 1355  Weight: 98.1 kg  (216 lb 4.3 oz)    Exam: General exam: In no acute distress. Respiratory system: Good air movement and clear to auscultation. Cardiovascular system: S1 & S2 heard, RRR. Gastrointestinal system: Abdomen is nondistended, soft and nontender.  Central nervous system: Alert and oriented. No focal neurological deficits. Extremities: No pedal edema. Skin: No rashes, lesions or ulcers Psychiatry: Judgement and insight appear normal. Mood & affect appropriate.    Data Reviewed:    Labs: Basic Metabolic Panel:  Recent Labs Lab 05/28/16 0919 05/31/16 0846 06/01/16 0049  NA 137 133* 131*  K 3.8 4.1 3.7  CL  --  97* 97*  CO2 27 26 25   GLUCOSE 104 143* 126*  BUN 24.6 23* 25*  CREATININE 0.7 0.79 0.79  CALCIUM 8.2* 8.2* 7.8*  MG 1.3*  --   --    GFR Estimated Creatinine Clearance: 62.9 mL/min (by C-G formula based on SCr of 0.79 mg/dL). Liver Function Tests:  Recent Labs Lab 05/28/16 0919 05/31/16 0846  AST 42* 36  ALT 33 27  ALKPHOS 152* 109  BILITOT 0.77 1.2  PROT 5.7* 5.8*  ALBUMIN 2.4* 2.7*    Recent Labs Lab 05/31/16 0846  LIPASE 11   No results for input(s): AMMONIA in the last 168 hours. Coagulation profile  Recent Labs Lab 05/31/16 0858 05/31/16 1334 06/01/16 0049  INR >10.00* >10.00* 2.05    CBC:  Recent Labs Lab 05/28/16 0919 05/31/16 0900 05/31/16 1334 06/01/16 0049  WBC 2.3* 0.5* 0.4* 1.0*  NEUTROABS 1.9 0.1* 0.0* 0.3*  HGB 11.4* 11.5* 10.6* 9.2*  HCT 35.6 35.7* 33.1* 28.0*  MCV 77.4* 78.1 77.5* 75.9*  PLT 163 138* 122* 86*   Cardiac Enzymes: No results for input(s): CKTOTAL, CKMB, CKMBINDEX, TROPONINI in the last 168 hours. BNP (last 3 results) No results for input(s): PROBNP in the last 8760 hours. CBG:  Recent Labs Lab 05/31/16 1026 05/31/16 1433 05/31/16 2002 05/31/16 2340 06/01/16 0347  GLUCAP 112* 111* 107* 135* 124*   D-Dimer: No results for input(s): DDIMER in the last 72 hours. Hgb A1c: No results for input(s):  HGBA1C in the last 72 hours. Lipid Profile: No results for input(s): CHOL, HDL, LDLCALC, TRIG, CHOLHDL, LDLDIRECT in the last 72 hours. Thyroid function studies: No results for input(s): TSH, T4TOTAL, T3FREE, THYROIDAB in the last 72 hours.  Invalid input(s): FREET3 Anemia work up: No results for input(s): VITAMINB12, FOLATE, FERRITIN, TIBC, IRON, RETICCTPCT in the last 72 hours. Sepsis Labs:  Recent Labs Lab 05/28/16 0919 05/31/16 0900 05/31/16 1334 06/01/16 0049  WBC 2.3* 0.5* 0.4* 1.0*   Microbiology Recent Results (from the past 240 hour(s))  TECHNOLOGIST REVIEW     Status: None   Collection Time: 05/28/16  9:19 AM  Result Value Ref Range  Status   Technologist Review Rare metamyelocyte  Final  MRSA PCR Screening     Status: None   Collection Time: 05/31/16  2:34 PM  Result Value Ref Range Status   MRSA by PCR NEGATIVE NEGATIVE Final    Comment:        The GeneXpert MRSA Assay (FDA approved for NASAL specimens only), is one component of a comprehensive MRSA colonization surveillance program. It is not intended to diagnose MRSA infection nor to guide or monitor treatment for MRSA infections.   C difficile quick scan w PCR reflex     Status: None   Collection Time: 05/31/16 11:30 PM  Result Value Ref Range Status   C Diff antigen NEGATIVE NEGATIVE Final   C Diff toxin NEGATIVE NEGATIVE Final   C Diff interpretation No C. difficile detected.  Final  Gastrointestinal Panel by PCR , Stool     Status: None   Collection Time: 05/31/16 11:30 PM  Result Value Ref Range Status   Campylobacter species NOT DETECTED NOT DETECTED Final   Plesimonas shigelloides NOT DETECTED NOT DETECTED Final   Salmonella species NOT DETECTED NOT DETECTED Final   Yersinia enterocolitica NOT DETECTED NOT DETECTED Final   Vibrio species NOT DETECTED NOT DETECTED Final   Vibrio cholerae NOT DETECTED NOT DETECTED Final   Enteroaggregative E coli (EAEC) NOT DETECTED NOT DETECTED Final    Enteropathogenic E coli (EPEC) NOT DETECTED NOT DETECTED Final   Enterotoxigenic E coli (ETEC) NOT DETECTED NOT DETECTED Final   Shiga like toxin producing E coli (STEC) NOT DETECTED NOT DETECTED Final   Shigella/Enteroinvasive E coli (EIEC) NOT DETECTED NOT DETECTED Final   Cryptosporidium NOT DETECTED NOT DETECTED Final   Cyclospora cayetanensis NOT DETECTED NOT DETECTED Final   Entamoeba histolytica NOT DETECTED NOT DETECTED Final   Giardia lamblia NOT DETECTED NOT DETECTED Final   Adenovirus F40/41 NOT DETECTED NOT DETECTED Final   Astrovirus NOT DETECTED NOT DETECTED Final   Norovirus GI/GII NOT DETECTED NOT DETECTED Final   Rotavirus A NOT DETECTED NOT DETECTED Final   Sapovirus (I, II, IV, and V) NOT DETECTED NOT DETECTED Final     Medications:   . sodium chloride   Intravenous Once  . dexamethasone  4 mg Oral BID WC  . diltiazem  240 mg Oral Daily  . insulin aspart  0-9 Units Subcutaneous Q4H  . metoprolol tartrate  25 mg Oral BID  . piperacillin-tazobactam (ZOSYN)  IV  3.375 g Intravenous Q8H  . Tbo-filgastrim (GRANIX) SQ  480 mcg Subcutaneous q1800   Continuous Infusions: . sodium chloride 50 mL/hr at 05/31/16 1900  . diltiazem (CARDIZEM) infusion 7.5 mg/hr (06/01/16 0209)    Time spent: 25 min   LOS: 1 day   Charlynne Cousins  [facility] Pager [phone]  *Please refer to [email or web address], password [email or web address] to get updated schedule on who will round on this patient, as hospitalists switch teams weekly. If 7PM-7AM, please contact night-coverage at [email or web address], password [email or web address] for any overnight needs.  06/01/2016, 7:12 AM

## 2016-06-01 NOTE — Consult Note (Signed)
[facility] CONSULT Reason for consult: G.I. bleeding Referring Physician: [facility] hospital[facility]. PCP: Dr. Jannetta Quint Yolanda James is an 82 y.o. female.  HPI: she was found recently to have ovarian cancer that was metastatic throughout the peritoneal cavity with malignant cells in the ascitic fluid. She was started on her 1st round of chemotherapy approximately 1 week ago. In addition she has atrial fibrillation diagnosed in 2008 with the [place] score for and has been on chronic Coumadin. She presented with one week of bloody bowel movements with some bright blood in some dark blood that was felt possibly to be due to iron therapy. She originally thought that this was hemorrhoids. Her INR was 10 and this is in the process of being corrected. Platelet count is currently 86 and hemoglobin 9.2. The patient reports that approximately 8 to 10 years ago she had colonoscopy by Dr. Benson Norway. This was at least 8 years ago since we have no record of it in our [facility]. She had a couple of polyps removed and was told to come back in a year and they would discuss whether or not to do the procedure again because of her age. She apparently chose not to go back. She does not remember being told that she had diverticulosis. She currently is having no abdominal pain. Her BUN is minimally elevated it 25. She denies the use of any NSAIDs. None are listed on her home medication sheet  Past Medical History:  Diagnosis Date  . Acute blood loss anemia   . Chronic a-fib ([place])    a. DCCV 02/05/2007. b. sotalol stopped 2012. c. managed with rate control.  . Chronic diastolic CHF (congestive heart failure) (HCC)    associated with AFIB RVR  . Essential hypertension   . Glaucoma   . Hypokalemia   . Long term current use of anticoagulant therapy   . Osteoarthritis    hands and knees, back & knees & "all over"   . Pure hypercholesterolemia   . Slow transit constipation   . Type 2 diabetes mellitus without complication, with  long-term current use of insulin [facility])     Past Surgical History:  Procedure Laterality Date  . EYE SURGERY     laser & cataracts removed & IOL both eyes, glaucoma surgery   . GANGLION CYST EXCISION Left 1965   wrist  . TOTAL HIP ARTHROPLASTY Right 09/03/2015   Procedure: TOTAL HIP ARTHROPLASTY;  Surgeon: Frederik Pear, MD;  Location: [place];  Service: Orthopedics;  Laterality: Right;  . vaginal births     x3    Family History  Problem Relation Age of Onset  . Arrhythmia Sister   . Heart attack Sister     Social History:  reports that she has quit smoking. She has never used smokeless tobacco. She reports that she does not drink alcohol or use drugs.  Allergies: No Known Allergies  Medications; Prior to Admission medications   Medication Sig Start Date End Date Taking? Authorizing Provider  atorvastatin (LIPITOR) 80 MG tablet TAKE 1 TABLET(80 MG) BY MOUTH DAILY 05/06/16  Yes Jettie Booze, MD  Coenzyme Q10 (CO Q 10) 100 MG CAPS Take 1 capsule by mouth daily.   Yes Historical Provider, MD  dexamethasone (DECADRON) 4 MG tablet Take 1 tablet (4 mg total) by mouth 2 (two) times daily with a meal. 05/30/16  Yes Ladell Pier, MD  diltiazem (CARDIZEM CD) 240 MG 24 hr capsule Take 1 capsule (240 mg total) by mouth daily.  11/26/15  Yes Brittainy Erie Noe, PA-C  dorzolamide (TRUSOPT) 2 % ophthalmic solution Place 1 drop into both eyes 2 (two) times daily.  03/11/16  Yes Historical Provider, MD  FREESTYLE LITE test strip every 3 (three) days. Reported on 09/11/2015 11/30/14  Yes Historical Provider, MD  furosemide (LASIX) 40 MG tablet Take 1 tablet daily. May take additional dose once weekly for swelling. 02/05/16  Yes Jettie Booze, MD  glipiZIDE (GLUCOTROL XL) 2.5 MG 24 hr tablet Take 2.5 mg by mouth 2 (two) times daily.  12/13/13  Yes Historical Provider, MD  Lancets (FREESTYLE) lancets every 3 (three) days. Reported on 09/11/2015 11/29/14  Yes Historical Provider, MD  LUMIGAN 0.01 %  SOLN Place 1 drop into both eyes at bedtime.  09/28/13  Yes Historical Provider, MD  metFORMIN (GLUCOPHAGE) 500 MG tablet Take 500 mg by mouth 2 (two) times daily with a meal.  12/10/13  Yes Historical Provider, MD  metoprolol tartrate (LOPRESSOR) 25 MG tablet Take 1 tablet (25 mg total) by mouth 2 (two) times daily. 04/07/16 07/06/16 Yes Jettie Booze, MD  Omega-3 Fatty Acids (FISH OIL) 1000 MG CAPS Take 1,000 mg by mouth 2 (two) times daily.   Yes Historical Provider, MD  ondansetron (ZOFRAN) 8 MG tablet Take 1 tablet (8 mg total) by mouth every 8 (eight) hours as needed for nausea or vomiting. 05/22/16  Yes Ladell Pier, MD  pilocarpine (PILOCAR) 4 % ophthalmic solution Place 1 drop into both eyes 4 (four) times daily.  11/23/13  Yes Historical Provider, MD  polyethylene glycol (MIRALAX / GLYCOLAX) packet Take 17 g by mouth daily as needed.    Yes Historical Provider, MD  potassium chloride (MICRO-K) 10 MEQ CR capsule Take 10 mEq by mouth daily.  12/01/15  Yes Historical Provider, MD  timolol (TIMOPTIC) 0.5 % ophthalmic solution Place 1 drop into both eyes 2 (two) times daily.  03/13/16  Yes Historical Provider, MD  warfarin (COUMADIN) 5 MG tablet TAKE 1 TABLET BY MOUTH AS DIRECTED Patient taking differently: Take 2.5 mg (one-half of a tablet) on Mondays, Wednesdays and Fridays. Take 5 mg (one tablet) on all other days 01/29/16  Yes Jettie Booze, MD   . sodium chloride   Intravenous Once  . ciprofloxacin  400 mg Intravenous Q12H  . dexamethasone  4 mg Oral BID WC  . diltiazem  240 mg Oral Daily  . insulin aspart  0-9 Units Subcutaneous Q4H  . metoprolol tartrate  25 mg Oral BID  . piperacillin-tazobactam (ZOSYN)  IV  3.375 g Intravenous Q8H  . sodium chloride  1,000 mL Intravenous Once  . Tbo-filgastrim (GRANIX) SQ  480 mcg Subcutaneous q1800   PRN Meds acetaminophen, ondansetron (ZOFRAN) IV, ondansetron Results for orders placed or performed during the hospital encounter of  05/31/16 (from the past 48 hour(s))  POC occult blood, ED Provider will collect     Status: Abnormal   Collection Time: 05/31/16  8:40 AM  Result Value Ref Range   Fecal Occult Bld POSITIVE (A) NEGATIVE  Lipase, blood     Status: None   Collection Time: 05/31/16  8:46 AM  Result Value Ref Range   Lipase 11 11 - 51 U/L  Comprehensive metabolic panel     Status: Abnormal   Collection Time: 05/31/16  8:46 AM  Result Value Ref Range   Sodium 133 (L) 135 - 145 mmol/L   Potassium 4.1 3.5 - 5.1 mmol/L   Chloride 97 (L) 101 - 111  mmol/L   CO2 26 22 - 32 mmol/L   Glucose, Bld 143 (H) 65 - 99 mg/dL   BUN 23 (H) 6 - 20 mg/dL   Creatinine, Ser 0.79 0.44 - 1.00 mg/dL   Calcium 8.2 (L) 8.9 - 10.3 mg/dL   Total Protein 5.8 (L) 6.5 - 8.1 g/dL   Albumin 2.7 (L) 3.5 - 5.0 g/dL   AST 36 15 - 41 U/L   ALT 27 14 - 54 U/L   Alkaline Phosphatase 109 38 - 126 U/L   Total Bilirubin 1.2 0.3 - 1.2 mg/dL   GFR calc non Af Amer >60 >60 mL/min   GFR calc Af Amer >60 >60 mL/min    Comment: (NOTE) The eGFR has been calculated using the CKD EPI equation. This calculation has not been validated in all clinical situations. eGFR's persistently <60 mL/min signify possible Chronic Kidney Disease.    Anion gap 10 5 - 15  Protime-INR     Status: Abnormal   Collection Time: 05/31/16  8:58 AM  Result Value Ref Range   Prothrombin Time 86.3 (H) 11.4 - 15.2 seconds   INR >10.00 (HH)     Comment: CRITICAL RESULT CALLED TO, READ BACK BY AND VERIFIED WITH: KELLAM,L RN [phone] COVINGTON,N   CBC with Differential/Platelet     Status: Abnormal   Collection Time: 05/31/16  9:00 AM  Result Value Ref Range   WBC 0.5 (LL) 4.0 - 10.5 K/uL    Comment: CRITICAL RESULT CALLED TO, READ BACK BY AND VERIFIED WITH: WEST,S RN [phone] COVINGTON,N    RBC 4.57 3.87 - 5.11 MIL/uL   Hemoglobin 11.5 (L) 12.0 - 15.0 g/dL   HCT 35.7 (L) 36.0 - 46.0 %   MCV 78.1 78.0 - 100.0 fL   MCH 25.2 (L) 26.0 - 34.0 pg   MCHC 32.2 30.0  - 36.0 g/dL   RDW 16.6 (H) 11.5 - 15.5 %   Platelets 138 (L) 150 - 400 K/uL    Comment: REPEATED TO VERIFY SPECIMEN CHECKED FOR CLOTS PLATELET COUNT CONFIRMED BY SMEAR    Neutrophils Relative % 12 %   Lymphocytes Relative 12 %   Monocytes Relative 76 %   Eosinophils Relative 0 %   Basophils Relative 0 %   Neutro Abs 0.1 (L) 1.7 - 7.7 K/uL   Lymphs Abs 0.1 (L) 0.7 - 4.0 K/uL   Monocytes Absolute 0.3 0.1 - 1.0 K/uL   Eosinophils Absolute 0.0 0.0 - 0.7 K/uL   Basophils Absolute 0.0 0.0 - 0.1 K/uL   RBC Morphology ELLIPTOCYTES    WBC Morphology SCHISTOCYTES NOTED ON SMEAR     Comment: WHITE COUNT CONFIRMED ON SMEAR  Urinalysis, Routine w reflex microscopic     Status: Abnormal   Collection Time: 05/31/16 10:20 AM  Result Value Ref Range   Color, Urine AMBER (A) YELLOW    Comment: BIOCHEMICALS MAY BE AFFECTED BY COLOR   APPearance CLOUDY (A) CLEAR   Specific Gravity, Urine 1.028 1.005 - 1.030   pH 6.0 5.0 - 8.0   Glucose, UA NEGATIVE NEGATIVE mg/dL   Hgb urine dipstick NEGATIVE NEGATIVE   Bilirubin Urine SMALL (A) NEGATIVE   Ketones, ur NEGATIVE NEGATIVE mg/dL   Protein, ur 100 (A) NEGATIVE mg/dL   Nitrite NEGATIVE NEGATIVE   Leukocytes, UA NEGATIVE NEGATIVE  Urine microscopic-add on     Status: Abnormal   Collection Time: 05/31/16 10:20 AM  Result Value Ref Range   Squamous Epithelial / LPF 0-5 (A) NONE  SEEN   WBC, UA 0-5 0 - 5 WBC/hpf   RBC / HPF 0-5 0 - 5 RBC/hpf   Bacteria, UA FEW (A) NONE SEEN   Casts GRANULAR CAST (A) NEGATIVE  CBG monitoring, ED     Status: Abnormal   Collection Time: 05/31/16 10:26 AM  Result Value Ref Range   Glucose-Capillary 112 (H) 65 - 99 mg/dL  Type and screen [place]     Status: None   Collection Time: 05/31/16 11:26 AM  Result Value Ref Range   ABO/RH(D) A POS    Antibody Screen NEG    Sample Expiration 06/03/2016   ABO/Rh     Status: None   Collection Time: 05/31/16 11:26 AM  Result Value Ref Range   ABO/RH(D)  A POS   Culture, blood (routine x 2)     Status: None (Preliminary result)   Collection Time: 05/31/16  1:20 PM  Result Value Ref Range   Specimen Description BLOOD RIGHT ANTECUBITAL    Special Requests BOTTLES DRAWN AEROBIC ONLY 6CC    Culture  Setup Time      GRAM NEGATIVE RODS AEROBIC BOTTLE ONLY CRITICAL RESULT CALLED TO, READ BACK BY AND VERIFIED WITH: D WOFFORD,PHARMD AT 1253 06/01/16 BY L BENFIELD Performed at [place]    Report Status PENDING   Culture, blood (routine x 2)     Status: None (Preliminary result)   Collection Time: 05/31/16  1:32 PM  Result Value Ref Range   Specimen Description BLOOD RIGHT ANTECUBITAL    Special Requests BOTTLES DRAWN AEROBIC ONLY 5CC    Culture  Setup Time      GRAM NEGATIVE RODS AEROBIC BOTTLE ONLY CRITICAL RESULT CALLED TO, READ BACK BY AND VERIFIED WITH: D WOFFORD,PHARMD AT 1253 06/01/16 BY L BENFIELD Performed at [place]    Report Status PENDING   Blood Culture ID Panel (Reflexed)     Status: Abnormal   Collection Time: 05/31/16  1:32 PM  Result Value Ref Range   Enterococcus species NOT DETECTED NOT DETECTED   Listeria monocytogenes NOT DETECTED NOT DETECTED   Staphylococcus species NOT DETECTED NOT DETECTED   Staphylococcus aureus NOT DETECTED NOT DETECTED   Streptococcus species NOT DETECTED NOT DETECTED   Streptococcus agalactiae NOT DETECTED NOT DETECTED   Streptococcus pneumoniae NOT DETECTED NOT DETECTED   Streptococcus pyogenes NOT DETECTED NOT DETECTED   Acinetobacter baumannii NOT DETECTED NOT DETECTED   Enterobacteriaceae species NOT DETECTED NOT DETECTED   Enterobacter cloacae complex NOT DETECTED NOT DETECTED   Escherichia coli NOT DETECTED NOT DETECTED   Klebsiella oxytoca NOT DETECTED NOT DETECTED   Klebsiella pneumoniae NOT DETECTED NOT DETECTED   Proteus species NOT DETECTED NOT DETECTED   Serratia marcescens NOT DETECTED NOT  DETECTED   Carbapenem resistance NOT DETECTED NOT DETECTED   Haemophilus influenzae NOT DETECTED NOT DETECTED   Neisseria meningitidis NOT DETECTED NOT DETECTED   Pseudomonas aeruginosa DETECTED (A) NOT DETECTED    Comment: CRITICAL RESULT CALLED TO, READ BACK BY AND VERIFIED WITH: D WOFFORD,PHARMD AT 1253 06/01/16 BY L BENFIELD    Candida albicans NOT DETECTED NOT DETECTED   Candida glabrata NOT DETECTED NOT DETECTED   Candida krusei NOT DETECTED NOT DETECTED   Candida parapsilosis NOT DETECTED NOT DETECTED   Candida tropicalis NOT DETECTED NOT DETECTED    Comment: Performed at [facility]  CBC with Differential/Platelet  Status: Abnormal   Collection Time: 05/31/16  1:34 PM  Result Value Ref Range   WBC 0.4 (LL) 4.0 - 10.5 K/uL    Comment: CRITICAL RESULT CALLED TO, READ BACK BY AND VERIFIED WITH: EARLY,S RN [phone] COVINGTON,N WHITE COUNT CONFIRMED ON SMEAR ADJUSTED FOR NUCLEATED RBC'S    RBC 4.27 3.87 - 5.11 MIL/uL   Hemoglobin 10.6 (L) 12.0 - 15.0 g/dL   HCT 33.1 (L) 36.0 - 46.0 %   MCV 77.5 (L) 78.0 - 100.0 fL   MCH 24.8 (L) 26.0 - 34.0 pg   MCHC 32.0 30.0 - 36.0 g/dL   RDW 16.5 (H) 11.5 - 15.5 %   Platelets 122 (L) 150 - 400 K/uL   Neutrophils Relative % 12 %   Lymphocytes Relative 23 %   Monocytes Relative 65 %   Eosinophils Relative 0 %   Basophils Relative 0 %   Neutro Abs 0.0 (L) 1.7 - 7.7 K/uL   Lymphs Abs 0.1 (L) 0.7 - 4.0 K/uL   Monocytes Absolute 0.3 0.1 - 1.0 K/uL   Eosinophils Absolute 0.0 0.0 - 0.7 K/uL   Basophils Absolute 0.0 0.0 - 0.1 K/uL   RBC Morphology ELLIPTOCYTES     Comment: TEARDROP CELLS SCHISTOCYTES NOTED ON SMEAR   Protime-INR     Status: Abnormal   Collection Time: 05/31/16  1:34 PM  Result Value Ref Range   Prothrombin Time 85.5 (H) 11.4 - 15.2 seconds   INR >10.00 (HH)     Comment: CRITICAL RESULT CALLED TO, READ BACK BY AND VERIFIED WITH: EARLY,S RN [phone] COVINGTON,N   Prepare fresh frozen plasma     Status:  None (Preliminary result)   Collection Time: 05/31/16  1:34 PM  Result Value Ref Range   Unit Number [id number]    Blood Component Type THAWED PLASMA    Unit division 00    Status of Unit ISSUED    Transfusion Status OK TO TRANSFUSE   Glucose, capillary     Status: Abnormal   Collection Time: 05/31/16  2:33 PM  Result Value Ref Range   Glucose-Capillary 111 (H) 65 - 99 mg/dL  MRSA PCR Screening     Status: None   Collection Time: 05/31/16  2:34 PM  Result Value Ref Range   MRSA by PCR NEGATIVE NEGATIVE    Comment:        The GeneXpert MRSA Assay (FDA approved for NASAL specimens only), is one component of a comprehensive MRSA colonization surveillance program. It is not intended to diagnose MRSA infection nor to guide or monitor treatment for MRSA infections.   Glucose, capillary     Status: Abnormal   Collection Time: 05/31/16  8:02 PM  Result Value Ref Range   Glucose-Capillary 107 (H) 65 - 99 mg/dL   Comment 1 Notify RN    Comment 2 Document in Chart   C difficile quick scan w PCR reflex     Status: None   Collection Time: 05/31/16 11:30 PM  Result Value Ref Range   C Diff antigen NEGATIVE NEGATIVE   C Diff toxin NEGATIVE NEGATIVE   C Diff interpretation No C. difficile detected.   Gastrointestinal Panel by PCR , Stool     Status: None   Collection Time: 05/31/16 11:30 PM  Result Value Ref Range   Campylobacter species NOT DETECTED NOT DETECTED   Plesimonas shigelloides NOT DETECTED NOT DETECTED   Salmonella species NOT DETECTED NOT DETECTED   Yersinia enterocolitica NOT DETECTED NOT DETECTED  Vibrio species NOT DETECTED NOT DETECTED   Vibrio cholerae NOT DETECTED NOT DETECTED   Enteroaggregative E coli (EAEC) NOT DETECTED NOT DETECTED   Enteropathogenic E coli (EPEC) NOT DETECTED NOT DETECTED   Enterotoxigenic E coli (ETEC) NOT DETECTED NOT DETECTED   Shiga like toxin producing E coli (STEC) NOT DETECTED NOT DETECTED   Shigella/Enteroinvasive E coli (EIEC)  NOT DETECTED NOT DETECTED   Cryptosporidium NOT DETECTED NOT DETECTED   Cyclospora cayetanensis NOT DETECTED NOT DETECTED   Entamoeba histolytica NOT DETECTED NOT DETECTED   Giardia lamblia NOT DETECTED NOT DETECTED   Adenovirus F40/41 NOT DETECTED NOT DETECTED   Astrovirus NOT DETECTED NOT DETECTED   Norovirus GI/GII NOT DETECTED NOT DETECTED   Rotavirus A NOT DETECTED NOT DETECTED   Sapovirus (I, II, IV, and V) NOT DETECTED NOT DETECTED  Glucose, capillary     Status: Abnormal   Collection Time: 05/31/16 11:40 PM  Result Value Ref Range   Glucose-Capillary 135 (H) 65 - 99 mg/dL  Basic metabolic panel     Status: Abnormal   Collection Time: 06/01/16 12:49 AM  Result Value Ref Range   Sodium 131 (L) 135 - 145 mmol/L   Potassium 3.7 3.5 - 5.1 mmol/L   Chloride 97 (L) 101 - 111 mmol/L   CO2 25 22 - 32 mmol/L   Glucose, Bld 126 (H) 65 - 99 mg/dL   BUN 25 (H) 6 - 20 mg/dL   Creatinine, Ser 0.79 0.44 - 1.00 mg/dL   Calcium 7.8 (L) 8.9 - 10.3 mg/dL   GFR calc non Af Amer >60 >60 mL/min   GFR calc Af Amer >60 >60 mL/min    Comment: (NOTE) The eGFR has been calculated using the CKD EPI equation. This calculation has not been validated in all clinical situations. eGFR's persistently <60 mL/min signify possible Chronic Kidney Disease.    Anion gap 9 5 - 15  CBC with Differential/Platelet     Status: Abnormal   Collection Time: 06/01/16 12:49 AM  Result Value Ref Range   WBC 1.0 (LL) 4.0 - 10.5 K/uL    Comment: REPEATED TO VERIFY CRITICAL VALUE NOTED.  VALUE IS CONSISTENT WITH PREVIOUSLY REPORTED AND CALLED VALUE.    RBC 3.69 (L) 3.87 - 5.11 MIL/uL   Hemoglobin 9.2 (L) 12.0 - 15.0 g/dL   HCT 28.0 (L) 36.0 - 46.0 %   MCV 75.9 (L) 78.0 - 100.0 fL   MCH 24.9 (L) 26.0 - 34.0 pg   MCHC 32.9 30.0 - 36.0 g/dL   RDW 16.3 (H) 11.5 - 15.5 %   Platelets 86 (L) 150 - 400 K/uL    Comment: SPECIMEN CHECKED FOR CLOTS REPEATED TO VERIFY PLATELET COUNT CONFIRMED BY SMEAR    Neutrophils  Relative % 26 %   Lymphocytes Relative 22 %   Monocytes Relative 52 %   Eosinophils Relative 0 %   Basophils Relative 0 %   Neutro Abs 0.3 (L) 1.7 - 7.7 K/uL   Lymphs Abs 0.2 (L) 0.7 - 4.0 K/uL   Monocytes Absolute 0.5 0.1 - 1.0 K/uL   Eosinophils Absolute 0.0 0.0 - 0.7 K/uL   Basophils Absolute 0.0 0.0 - 0.1 K/uL   RBC Morphology POLYCHROMASIA PRESENT     Comment: CRENATED RBCs  Protime-INR     Status: Abnormal   Collection Time: 06/01/16 12:49 AM  Result Value Ref Range   Prothrombin Time 23.5 (H) 11.4 - 15.2 seconds   INR 2.05   Glucose, capillary  Status: Abnormal   Collection Time: 06/01/16  3:47 AM  Result Value Ref Range   Glucose-Capillary 124 (H) 65 - 99 mg/dL  Glucose, capillary     Status: Abnormal   Collection Time: 06/01/16  7:36 AM  Result Value Ref Range   Glucose-Capillary 116 (H) 65 - 99 mg/dL   Comment 1 Notify RN    Comment 2 Document in Chart   CBC     Status: Abnormal   Collection Time: 06/01/16  7:47 AM  Result Value Ref Range   WBC 1.4 (LL) 4.0 - 10.5 K/uL    Comment: RESULT REPEATED AND VERIFIED CRITICAL VALUE NOTED.  VALUE IS CONSISTENT WITH PREVIOUSLY REPORTED AND CALLED VALUE.    RBC 3.69 (L) 3.87 - 5.11 MIL/uL   Hemoglobin 9.2 (L) 12.0 - 15.0 g/dL   HCT 28.1 (L) 36.0 - 46.0 %   MCV 76.2 (L) 78.0 - 100.0 fL   MCH 24.9 (L) 26.0 - 34.0 pg   MCHC 32.7 30.0 - 36.0 g/dL   RDW 16.3 (H) 11.5 - 15.5 %   Platelets 79 (L) 150 - 400 K/uL    Comment: RESULT REPEATED AND VERIFIED SPECIMEN CHECKED FOR CLOTS CONSISTENT WITH PREVIOUS RESULT   Glucose, capillary     Status: Abnormal   Collection Time: 06/01/16 12:15 PM  Result Value Ref Range   Glucose-Capillary 123 (H) 65 - 99 mg/dL   Comment 1 Notify RN    Comment 2 Document in Chart     Dg Chest 2 View  Result Date: 05/31/2016 CLINICAL DATA:  Pt states she is "wore out". Recent chemo treatment for ovarian cancer. Denies any chest complaints. H/o CHF, Chronic a-fib, and type 2 diabetes. Former  smoker. EXAM: CHEST  2 VIEW COMPARISON:  08/27/2015 FINDINGS: Small left pleural effusion. No focal consolidation. No pneumothorax. Stable cardiomediastinal silhouette. No acute osseous abnormality. IMPRESSION: Small left pleural effusion. Electronically Signed   By: Kathreen Devoid   On: 05/31/2016 13:19               Blood pressure (!) 103/51, pulse 99, temperature 97.6 F (36.4 C), temperature source Oral, resp. rate 16, height _0  (1.676 m), weight 98.1 kg (216 lb 4.3 oz), SpO2 92 %.  Physical exam:   General-- somewhat chubby white female no acute distress ENT-- nonicteric Neck-- supple Heart-- slightly tachycardic Lungs-- clear Abdomen-- soft and completely nontender Psych-- alert and oriented seems appropriate   Assessment: 1. Lower G.I. bleed. This is most likely etiology possibly diverticuli were hemorrhoids. She is over anticoagulated and has some pancytopenia due to her chemotherapy. At this point I would treater conservatively and defer any endoscopic therapy unless she does not stop bleeding. 2. Ovarian cancer has just completed her 1st round of chemotherapy with some likely pancytopenia. Has malignant ascites 3. Atrial fibrillation. Chronically anticoagulated was markedly over anticoagulated on admission 4. Type II diabetes 5. History of colon polyps removed 8 or 10 years ago with no repeat colonoscopy  Plan: at this point I would treat conservatively. His long as she stops bleeding I do not feel there is any need for endoscopic evaluation. Will start her empirically on Protonix PO we will follow with you.  Endya Austin JR,Kwane Rohl L 06/01/2016, 1:23 PM   This note was created using voice recognition software and minor errors may Have occurred unintentionally. Pager: [phone] If no answer or after hours call [phone]

## 2016-06-01 NOTE — Progress Notes (Signed)
Orthostatic blood pressures taken twice today. Both times patient felt too unsteady to stand, but denied any dizziness.  At 1328 lying blood pressure was 92/50 and sitting on the side of the bed was 90/43.  At 1718 lying blood pressure was 104/56 and sitting on the side of the bed was 109/57.

## 2016-06-01 NOTE — Progress Notes (Signed)
Yolanda James   DOB:1931/06/28   [id number]    Subjective: She is feeling a little better today. Blood culture is positive for Pseudomonas. She denies abdominal pain or diarrhea. The patient denies any recent signs or symptoms of bleeding such as spontaneous epistaxis, hematuria or hematochezia. However, nursing staff noted to have melena and GI was consulted Last documented fever was around 6 PM yesterday   Objective:  Vitals:   06/01/16 1100 06/01/16 1200  BP: (!) 100/45 (!) 103/51  Pulse:    Resp: 11 16  Temp:  97.6 F (36.4 C)     Intake/Output Summary (Last 24 hours) at 06/01/16 1415 Last data filed at 06/01/16 1200  Gross per 24 hour  Intake          1484.73 ml  Output              200 ml  Net          1284.73 ml    GENERAL:alert, no distress and comfortable. She is moderately obese SKIN: She looks a little pale EYES: normal, Conjunctiva are pink and non-injected, sclera clear OROPHARYNX:no exudate, no erythema and lips, buccal mucosa, and tongue normal  Musculoskeletal:no cyanosis of digits and no clubbing  NEURO: alert & oriented x 3 with fluent speech, no focal motor/sensory deficits   Labs:  Lab Results  Component Value Date   WBC 1.4 (LL) 06/01/2016   HGB 9.2 (L) 06/01/2016   HCT 28.1 (L) 06/01/2016   MCV 76.2 (L) 06/01/2016   PLT 79 (L) 06/01/2016   NEUTROABS 0.3 (L) 06/01/2016    Lab Results  Component Value Date   NA 131 (L) 06/01/2016   K 3.7 06/01/2016   CL 97 (L) 06/01/2016   CO2 25 06/01/2016    Studies:  Dg Chest 2 View  Result Date: 05/31/2016 CLINICAL DATA:  Pt states she is "wore out". Recent chemo treatment for ovarian cancer. Denies any chest complaints. H/o CHF, Chronic a-fib, and type 2 diabetes. Former smoker. EXAM: CHEST  2 VIEW COMPARISON:  08/27/2015 FINDINGS: Small left pleural effusion. No focal consolidation. No pneumothorax. Stable cardiomediastinal silhouette. No acute osseous abnormality. IMPRESSION: Small left pleural  effusion. Electronically Signed   By: Kathreen Devoid   On: 05/31/2016 13:19   US Abdomen Complete  Result Date: 06/01/2016 CLINICAL DATA:  Patient with diffuse abdominal pain for 3 days. EXAM: ABDOMEN ULTRASOUND COMPLETE COMPARISON:  CT abdomen pelvis 05/07/2016. FINDINGS: Gallbladder: No gallstones or wall thickening visualized. No sonographic Murphy sign noted by sonographer. Common bile duct: Diameter: 5 mm Liver: No focal lesion identified. Within normal limits in parenchymal echogenicity. IVC: No abnormality visualized. Pancreas: Visualized portion unremarkable. Spleen: Size and appearance within normal limits. Right Kidney: Length: 10.6 cm. Echogenicity within normal limits. No mass or hydronephrosis visualized. Left Kidney: Length: 8.4 cm. Echogenicity within normal limits. No mass or hydronephrosis visualized. Abdominal aorta: No aneurysm visualized. Other findings: Small amount of ascites. IMPRESSION: Small amount of ascites. No acute process within the abdomen. Electronically Signed   By: Lovey Newcomer M.D.   On: 06/01/2016 13:36    Assessment & Plan:   Neutropenic sepsis Most likely cause is from her GI tract Multiple cultures are pending but preliminary blood culture came back positive for Pseudomonas I spoke with the pharmacist and we will add additional coverage with ciprofloxacin I will continue G-CSF support with daily CBC until [place] is greater than 1.5  Anemia secondary to chronic illness and chemotherapy She does not need  transfusion unless hemoglobin is less than 8  Thrombocytopenia due to recent chemotherapy There is no contraindication to remain on aspirin/anticoagulation therapy as long as the platelet is greater than 50,000.  Supratherapeutic INR Due to poor oral intake Hold Warfarin. Agree with vitamin K and fresh frozen plasma INR improved to 2.05  Atrial fibrillation with rapid with ventricular rate Likely triggered by sepsis Continue medical management. Agree  with telemetry monitoring  Severe protein calorie malnutrition Recommend dietitian consult while hospitalized  Abdominal pain and bloody diarrhea Could be acute colitis/diverticulitis Ultrasound is negative for acute cholecystitis but small amount of ascites is noted Treat with appropriate antibiotics for now  Ovarian cancer Not due for treatment Continue supportive care  Discharge planning Recommend continue ICU stay Her primary oncologist will return to follow tomorrow  Heath Lark, MD 06/01/2016  2:15 PM

## 2016-06-02 ENCOUNTER — Ambulatory Visit: Payer: Medicare Other | Admitting: Nurse Practitioner

## 2016-06-02 ENCOUNTER — Other Ambulatory Visit: Payer: Medicare Other

## 2016-06-02 DIAGNOSIS — I4891 Unspecified atrial fibrillation: Secondary | ICD-10-CM

## 2016-06-02 DIAGNOSIS — R140 Abdominal distension (gaseous): Secondary | ICD-10-CM

## 2016-06-02 DIAGNOSIS — D61818 Other pancytopenia: Secondary | ICD-10-CM

## 2016-06-02 DIAGNOSIS — R7881 Bacteremia: Secondary | ICD-10-CM

## 2016-06-02 DIAGNOSIS — C569 Malignant neoplasm of unspecified ovary: Secondary | ICD-10-CM

## 2016-06-02 DIAGNOSIS — B965 Pseudomonas (aeruginosa) (mallei) (pseudomallei) as the cause of diseases classified elsewhere: Secondary | ICD-10-CM

## 2016-06-02 LAB — CBC WITH DIFFERENTIAL/PLATELET
BASOS ABS: 0 10*3/uL (ref 0.0–0.1)
BASOS PCT: 0 %
EOS ABS: 0 10*3/uL (ref 0.0–0.7)
Eosinophils Relative: 0 %
HCT: 28.4 % — ABNORMAL LOW (ref 36.0–46.0)
Hemoglobin: 9.1 g/dL — ABNORMAL LOW (ref 12.0–15.0)
Lymphocytes Relative: 9 %
Lymphs Abs: 0.3 10*3/uL — ABNORMAL LOW (ref 0.7–4.0)
MCH: 25.2 pg — AB (ref 26.0–34.0)
MCHC: 32 g/dL (ref 30.0–36.0)
MCV: 78.7 fL (ref 78.0–100.0)
MONO ABS: 0.8 10*3/uL (ref 0.1–1.0)
Monocytes Relative: 23 %
NEUTROS ABS: 2.3 10*3/uL (ref 1.7–7.7)
Neutrophils Relative %: 68 %
PLATELETS: 89 10*3/uL — AB (ref 150–400)
RBC: 3.61 MIL/uL — ABNORMAL LOW (ref 3.87–5.11)
RDW: 16.6 % — AB (ref 11.5–15.5)
WBC: 3.4 10*3/uL — ABNORMAL LOW (ref 4.0–10.5)

## 2016-06-02 LAB — BASIC METABOLIC PANEL
Anion gap: 5 (ref 5–15)
BUN: 27 mg/dL — AB (ref 6–20)
CHLORIDE: 103 mmol/L (ref 101–111)
CO2: 24 mmol/L (ref 22–32)
CREATININE: 0.91 mg/dL (ref 0.44–1.00)
Calcium: 7.3 mg/dL — ABNORMAL LOW (ref 8.9–10.3)
GFR calc Af Amer: >60 mL/min (ref >60)
GFR calc non Af Amer: 57 mL/min — ABNORMAL LOW (ref >60)
Glucose, Bld: 125 mg/dL — ABNORMAL HIGH (ref 65–99)
POTASSIUM: 3.6 mmol/L (ref 3.5–5.1)
Sodium: 132 mmol/L — ABNORMAL LOW (ref 135–145)

## 2016-06-02 LAB — GLUCOSE, CAPILLARY
GLUCOSE-CAPILLARY: 112 mg/dL — AB (ref 65–99)
GLUCOSE-CAPILLARY: 136 mg/dL — AB (ref 65–99)
GLUCOSE-CAPILLARY: 145 mg/dL — AB (ref 65–99)
GLUCOSE-CAPILLARY: 149 mg/dL — AB (ref 65–99)
Glucose-Capillary: 108 mg/dL — ABNORMAL HIGH (ref 65–99)
Glucose-Capillary: 139 mg/dL — ABNORMAL HIGH (ref 65–99)

## 2016-06-02 LAB — TYPE AND SCREEN
ABO/RH(D): A POS
ANTIBODY SCREEN: NEGATIVE

## 2016-06-02 MED ORDER — PHYTONADIONE 5 MG PO TABS
2.5000 mg | ORAL_TABLET | Freq: Once | ORAL | Status: AC
  Administered 2016-06-02: 2.5 mg via ORAL
  Filled 2016-06-02: qty 1

## 2016-06-02 MED ORDER — TBO-FILGRASTIM 480 MCG/0.8ML ~~LOC~~ SOSY: 480.0000 ug | PREFILLED_SYRINGE | Freq: Every day | SUBCUTANEOUS | Status: DC

## 2016-06-02 MED ORDER — METOPROLOL TARTRATE 5 MG/5ML IV SOLN: 2.5000 mg | Freq: Four times a day (QID) | INTRAVENOUS | Status: DC | PRN

## 2016-06-02 MED ORDER — SODIUM CHLORIDE 0.9 % IV SOLN: INTRAVENOUS | Status: DC

## 2016-06-02 MED ORDER — SODIUM CHLORIDE 0.9 % IV SOLN
INTRAVENOUS | Status: DC
  Administered 2016-06-03 – 2016-06-04 (×3): via INTRAVENOUS

## 2016-06-02 NOTE — Progress Notes (Signed)
[facility] PROGRESS NOTE    Progress Note  SHATISHA MUILENBURG  [id number] DOB: [birth year] DOA: 05/31/2016 PCP: [name], MD     Brief Narrative:   Yolanda James is an 82 y.o. female Past medical history of ovarian ca On chemotherapy, A. Fib on chronic Coumadin, Comes into the hospital with bright Red blood per rectum And melanotic stools. Her previous Hemoglobin on 05/20/2016 it was 13 on admission was 9, Her INR was supra therapeutic at > 10.0. In the ED she received IV vitamin k and fresh frozen plasma.She also has been having fevers at chemotherapy about a week ago she was leukopenic with thrombocytopenia. Blood cultures was positive  For pseudomonas aeruginosa, she is on double coverage with Zosyn and cipro. On admission she was found on A.fib with RVR on admission.  Which has now been changed to oral diltiazem, home dose metoprolol was continue and IV metoprolol when necessary her heart rate has been constantly   Assessment/Plan:   Acute lower GI bleed/acute blood loss anemia: GI has been consulted, she recieved fresh frozen plasma, her INR down to  <2.  Continue clear liquid diet, orthostatics negative, her baseline hemoglobin is 13 now it's 9. Downa 4 g drop from 05/20/2016. She is just a little bit weak, I will not transfuse her unless her hemoglobin goes below 7 or she started developing chest pain. We have reverse her INR and transfusing her would put her at higher risk of having a stroke with a hemoglobin of 10 or greater Hbg is stable Platelets are still greater than 50,000 and trending up.  Thrombocytopenia: Likely due to chemotherapy, if platelets go below 50,000 transfuse platelets. Continue to trend.  A. fib with RVR: Off diltiazem drip, his heart rate now is< 100s, question if intravascular volume is driving her RVR all with her neutropenic fever. Cont gentle hydration.  Pseudomonas bacteremia/Neutropenic fever: I will cont empiric antibiotics,  continue Neupogen. Her ANC is slowly coming up. Has resolved, will continue monitor ANC. Blood cultures ID panel + Pseudomonas, cultures pending We have added ciprofloxacin and  Continue Zosyn for double coverage For Pseudomonas.  Diabetes mellitus type 2: Agree withholding oral hypoglycemic agents. Continue on clear liquid diet and sliding scale insulin.  Recent diagnosis of ovarian cancer: Continue Decadron twice a day and Zofran. Oncology was consulted recommended Neupogen  Drug-induced neutropenia ([place]) Continue Neupogen. CBCs daily.  Severe protein caloric malnutrition: Continue ensure 3 times a day.  Abdominal pain: Now resolved, Abdominal ultrasound rule out cholecystitis.  DVT prophylaxis: SCD Family Communication:none Disposition Plan/Barrier to D/C: Transfer to telemetry Code Status:     Code Status Orders        Start     Ordered   05/31/16 1426  Full code  Continuous     05/31/16 1425    Code Status History    Date Active Date Inactive Code Status Order ID Comments User Context   This patient has a current code status but no historical code status.    Advance Directive Documentation   Flowsheet Row Most Recent Value  Type of Advance Directive  Healthcare Power of Attorney  Pre-existing out of facility DNR order (yellow form or pink MOST form)  No data  "MOST" Form in Place?  No data        IV Access:    Peripheral IV   Procedures and diagnostic studies:   Dg Chest 2 View  Result Date: 05/31/2016 CLINICAL DATA:  Pt states she is "  wore out". Recent chemo treatment for ovarian cancer. Denies any chest complaints. H/o CHF, Chronic a-fib, and type 2 diabetes. Former smoker. EXAM: CHEST  2 VIEW COMPARISON:  08/27/2015 FINDINGS: Small left pleural effusion. No focal consolidation. No pneumothorax. Stable cardiomediastinal silhouette. No acute osseous abnormality. IMPRESSION: Small left pleural effusion. Electronically Signed   By: Kathreen Devoid   On:  05/31/2016 13:19   US Abdomen Complete  Result Date: 06/01/2016 CLINICAL DATA:  Patient with diffuse abdominal pain for 3 days. EXAM: ABDOMEN ULTRASOUND COMPLETE COMPARISON:  CT abdomen pelvis 05/07/2016. FINDINGS: Gallbladder: No gallstones or wall thickening visualized. No sonographic Murphy sign noted by sonographer. Common bile duct: Diameter: 5 mm Liver: No focal lesion identified. Within normal limits in parenchymal echogenicity. IVC: No abnormality visualized. Pancreas: Visualized portion unremarkable. Spleen: Size and appearance within normal limits. Right Kidney: Length: 10.6 cm. Echogenicity within normal limits. No mass or hydronephrosis visualized. Left Kidney: Length: 8.4 cm. Echogenicity within normal limits. No mass or hydronephrosis visualized. Abdominal aorta: No aneurysm visualized. Other findings: Small amount of ascites. IMPRESSION: Small amount of ascites. No acute process within the abdomen. Electronically Signed   By: Lovey Newcomer M.D.   On: 06/01/2016 13:36     Medical Consultants:    None.  Anti-Infectives:   Zosyn  Subjective:    Yolanda James  Feels better than yesterday.  Objective:    Vitals:   06/02/16 0500 06/02/16 0600 06/02/16 0654 06/02/16 0700  BP: 113/68 118/71  111/70  Pulse:      Resp: 10 15  14   Temp:   97.6 F (36.4 C)   TempSrc:   Oral   SpO2: 95% 95%  92%  Weight:      Height:        Intake/Output Summary (Last 24 hours) at 06/02/16 0725 Last data filed at 06/02/16 0700  Gross per 24 hour  Intake          3108.76 ml  Output              465 ml  Net          2643.76 ml   Filed Weights   05/31/16 1355  Weight: 98.1 kg (216 lb 4.3 oz)    Exam: General exam: In no acute distress. Respiratory system: Good air movement and clear to auscultation. Cardiovascular system: S1 & S2 heard, RRR. Gastrointestinal system: Abdomen is nondistended, soft and nontender.  Extremities: No pedal edema. Skin: No rashes, lesions or  ulcers Psychiatry: Judgement and insight appear normal. Mood & affect appropriate.    Data Reviewed:    Labs: Basic Metabolic Panel:  Recent Labs Lab 05/28/16 0919 05/31/16 0846 06/01/16 0049  NA 137 133* 131*  K 3.8 4.1 3.7  CL  --  97* 97*  CO2 27 26 25   GLUCOSE 104 143* 126*  BUN 24.6 23* 25*  CREATININE 0.7 0.79 0.79  CALCIUM 8.2* 8.2* 7.8*  MG 1.3*  --   --    GFR Estimated Creatinine Clearance: 62.9 mL/min (by C-G formula based on SCr of 0.79 mg/dL). Liver Function Tests:  Recent Labs Lab 05/28/16 0919 05/31/16 0846  AST 42* 36  ALT 33 27  ALKPHOS 152* 109  BILITOT 0.77 1.2  PROT 5.7* 5.8*  ALBUMIN 2.4* 2.7*    Recent Labs Lab 05/31/16 0846  LIPASE 11   No results for input(s): AMMONIA in the last 168 hours. Coagulation profile  Recent Labs Lab 05/31/16 0858 05/31/16 1334 06/01/16 0049 06/01/16  2010  INR >10.00* >10.00* 2.05 1.92    CBC:  Recent Labs Lab 05/28/16 0919  05/31/16 0900 05/31/16 1334 06/01/16 0049 06/01/16 0747 06/01/16 2010 06/02/16 0334  WBC 2.3*  < > 0.5* 0.4* 1.0* 1.4* 2.4* 3.4*  NEUTROABS 1.9  --  0.1* 0.0* 0.3*  --   --  2.3  HGB 11.4*  < > 11.5* 10.6* 9.2* 9.2* 9.2* 9.1*  HCT 35.6  < > 35.7* 33.1* 28.0* 28.1* 27.7* 28.4*  MCV 77.4*  < > 78.1 77.5* 75.9* 76.2* 76.7* 78.7  PLT 163  < > 138* 122* 86* 79* 87* 89*  < > = values in this interval not displayed. Cardiac Enzymes: No results for input(s): CKTOTAL, CKMB, CKMBINDEX, TROPONINI in the last 168 hours. BNP (last 3 results) No results for input(s): PROBNP in the last 8760 hours. CBG:  Recent Labs Lab 06/01/16 1215 06/01/16 1610 06/01/16 2008 06/01/16 2346 06/02/16 0317  GLUCAP 123* 139* 104* 115* 136*   D-Dimer: No results for input(s): DDIMER in the last 72 hours. Hgb A1c: No results for input(s): HGBA1C in the last 72 hours. Lipid Profile: No results for input(s): CHOL, HDL, LDLCALC, TRIG, CHOLHDL, LDLDIRECT in the last 72 hours. Thyroid  function studies: No results for input(s): TSH, T4TOTAL, T3FREE, THYROIDAB in the last 72 hours.  Invalid input(s): FREET3 Anemia work up: No results for input(s): VITAMINB12, FOLATE, FERRITIN, TIBC, IRON, RETICCTPCT in the last 72 hours. Sepsis Labs:  Recent Labs Lab 06/01/16 0049 06/01/16 0747 06/01/16 2010 06/02/16 0334  WBC 1.0* 1.4* 2.4* 3.4*   Microbiology Recent Results (from the past 240 hour(s))  TECHNOLOGIST REVIEW     Status: None   Collection Time: 05/28/16  9:19 AM  Result Value Ref Range Status   Technologist Review Rare metamyelocyte  Final  Culture, blood (routine x 2)     Status: None (Preliminary result)   Collection Time: 05/31/16  1:20 PM  Result Value Ref Range Status   Specimen Description BLOOD RIGHT ANTECUBITAL  Final   Special Requests BOTTLES DRAWN AEROBIC ONLY 6CC  Final   Culture  Setup Time   Final    GRAM NEGATIVE RODS AEROBIC BOTTLE ONLY CRITICAL RESULT CALLED TO, READ BACK BY AND VERIFIED WITH: D WOFFORD,PHARMD AT 1253 06/01/16 BY L BENFIELD Performed at [facility]  Final   Report Status PENDING  Incomplete  Culture, blood (routine x 2)     Status: None (Preliminary result)   Collection Time: 05/31/16  1:32 PM  Result Value Ref Range Status   Specimen Description BLOOD RIGHT ANTECUBITAL  Final   Special Requests BOTTLES DRAWN AEROBIC ONLY 5CC  Final   Culture  Setup Time   Final    GRAM NEGATIVE RODS AEROBIC BOTTLE ONLY CRITICAL RESULT CALLED TO, READ BACK BY AND VERIFIED WITH: D WOFFORD,PHARMD AT 1253 06/01/16 BY L BENFIELD Performed at [place]  Final   Report Status PENDING  Incomplete  Blood Culture ID Panel (Reflexed)     Status: Abnormal   Collection Time: 05/31/16  1:32 PM  Result Value Ref Range Status   Enterococcus species NOT DETECTED NOT DETECTED Final   Listeria monocytogenes NOT DETECTED NOT DETECTED Final   Staphylococcus species NOT  DETECTED NOT DETECTED Final   Staphylococcus aureus NOT DETECTED NOT DETECTED Final   Streptococcus species NOT DETECTED NOT DETECTED Final   Streptococcus agalactiae NOT DETECTED NOT DETECTED Final  Streptococcus pneumoniae NOT DETECTED NOT DETECTED Final   Streptococcus pyogenes NOT DETECTED NOT DETECTED Final   Acinetobacter baumannii NOT DETECTED NOT DETECTED Final   Enterobacteriaceae species NOT DETECTED NOT DETECTED Final   Enterobacter cloacae complex NOT DETECTED NOT DETECTED Final   Escherichia coli NOT DETECTED NOT DETECTED Final   Klebsiella oxytoca NOT DETECTED NOT DETECTED Final   Klebsiella pneumoniae NOT DETECTED NOT DETECTED Final   Proteus species NOT DETECTED NOT DETECTED Final   Serratia marcescens NOT DETECTED NOT DETECTED Final   Carbapenem resistance NOT DETECTED NOT DETECTED Final   Haemophilus influenzae NOT DETECTED NOT DETECTED Final   Neisseria meningitidis NOT DETECTED NOT DETECTED Final   Pseudomonas aeruginosa DETECTED (A) NOT DETECTED Final    Comment: CRITICAL RESULT CALLED TO, READ BACK BY AND VERIFIED WITH: D WOFFORD,PHARMD AT 1253 06/01/16 BY L BENFIELD    Candida albicans NOT DETECTED NOT DETECTED Final   Candida glabrata NOT DETECTED NOT DETECTED Final   Candida krusei NOT DETECTED NOT DETECTED Final   Candida parapsilosis NOT DETECTED NOT DETECTED Final   Candida tropicalis NOT DETECTED NOT DETECTED Final    Comment: Performed at [facility]  MRSA PCR Screening     Status: None   Collection Time: 05/31/16  2:34 PM  Result Value Ref Range Status   MRSA by PCR NEGATIVE NEGATIVE Final    Comment:        The GeneXpert MRSA Assay (FDA approved for NASAL specimens only), is one component of a comprehensive MRSA colonization surveillance program. It is not intended to diagnose MRSA infection nor to guide or monitor treatment for MRSA infections.   C difficile quick scan w PCR reflex     Status: None   Collection Time: 05/31/16 11:30  PM  Result Value Ref Range Status   C Diff antigen NEGATIVE NEGATIVE Final   C Diff toxin NEGATIVE NEGATIVE Final   C Diff interpretation No C. difficile detected.  Final  Gastrointestinal Panel by PCR , Stool     Status: None   Collection Time: 05/31/16 11:30 PM  Result Value Ref Range Status   Campylobacter species NOT DETECTED NOT DETECTED Final   Plesimonas shigelloides NOT DETECTED NOT DETECTED Final   Salmonella species NOT DETECTED NOT DETECTED Final   Yersinia enterocolitica NOT DETECTED NOT DETECTED Final   Vibrio species NOT DETECTED NOT DETECTED Final   Vibrio cholerae NOT DETECTED NOT DETECTED Final   Enteroaggregative E coli (EAEC) NOT DETECTED NOT DETECTED Final   Enteropathogenic E coli (EPEC) NOT DETECTED NOT DETECTED Final   Enterotoxigenic E coli (ETEC) NOT DETECTED NOT DETECTED Final   Shiga like toxin producing E coli (STEC) NOT DETECTED NOT DETECTED Final   Shigella/Enteroinvasive E coli (EIEC) NOT DETECTED NOT DETECTED Final   Cryptosporidium NOT DETECTED NOT DETECTED Final   Cyclospora cayetanensis NOT DETECTED NOT DETECTED Final   Entamoeba histolytica NOT DETECTED NOT DETECTED Final   Giardia lamblia NOT DETECTED NOT DETECTED Final   Adenovirus F40/41 NOT DETECTED NOT DETECTED Final   Astrovirus NOT DETECTED NOT DETECTED Final   Norovirus GI/GII NOT DETECTED NOT DETECTED Final   Rotavirus A NOT DETECTED NOT DETECTED Final   Sapovirus (I, II, IV, and V) NOT DETECTED NOT DETECTED Final     Medications:   . sodium chloride   Intravenous Once  . ciprofloxacin  400 mg Intravenous Q12H  . dexamethasone  4 mg Oral BID WC  . diltiazem  240 mg Oral Daily  .  insulin aspart  0-9 Units Subcutaneous Q4H  . metoprolol tartrate  25 mg Oral BID  . piperacillin-tazobactam (ZOSYN)  IV  3.375 g Intravenous Q8H  . Tbo-filgastrim (GRANIX) SQ  480 mcg Subcutaneous q1800   Continuous Infusions: . sodium chloride 100 mL/hr at 06/02/16 0700  . diltiazem (CARDIZEM) infusion  Stopped (06/02/16 0121)    Time spent: 25 min   LOS: 2 days   Charlynne Cousins  [facility] Pager [phone]  *Please refer to [email or web address], password [email or web address] to get updated schedule on who will round on this patient, as hospitalists switch teams weekly. If 7PM-7AM, please contact night-coverage at [email or web address], password [email or web address] for any overnight needs.  06/02/2016, 7:25 AM

## 2016-06-02 NOTE — Progress Notes (Signed)
Occupational Therapy Evaluation Patient Details Name: Yolanda James MRN: [id number] DOB: Nov 24, 1930 Today's Date: 06/02/2016    History of Present Illness Yolanda James is an 82 y.o. female adm with lower GI bleed Past medical history of ovarian ca On chemotherapy, A. Fib on chronic anticoag, HTN, DM, R THA   Clinical Impression   Pt admitted with lower GI bleed. Pt currently with functional limitations due to the deficits listed below (see OT Problem List).  Pt will benefit from skilled OT to increase their safety and independence with ADL and functional mobility for ADL to facilitate discharge to venue listed below.     Follow Up Recommendations  SNF;Supervision/Assistance - 24 hour;Home health OT    Equipment Recommendations  None recommended by OT    Recommendations for Other Services       Precautions / Restrictions Precautions Precautions: Fall Restrictions Weight Bearing Restrictions: No      Mobility Bed Mobility Overal bed mobility: Needs Assistance Bed Mobility: Supine to Sit     Supine to sit: Mod assist     General bed mobility comments: incr time, assist to elevate trunk and position self  Transfers Overall transfer level: Needs assistance Equipment used: Rolling walker (2 wheeled) Transfers: Sit to/from [facility] Sit to Stand: Min assist         General transfer comment: cues for safety    Balance Overall balance assessment: Needs assistance Sitting-balance support: Feet supported;No upper extremity supported Sitting balance-Leahy Scale: Fair     Standing balance support: Bilateral upper extremity supported Standing balance-Leahy Scale: Poor                              ADL Overall ADL's : Needs assistance/impaired Eating/Feeding: Set up;Sitting   Grooming: Brushing hair;Set up;Sitting           Upper Body Dressing : Minimal assistance;Sitting   Lower Body Dressing: Maximal assistance;Sit  to/from stand;Cueing for safety;Cueing for sequencing;Cueing for compensatory techniques   Toilet Transfer: Minimal assistance;Ambulation;Cueing for sequencing;Cueing for safety;RW;BSC   Toileting- Clothing Manipulation and Hygiene: Minimal assistance;Sit to/from stand;Cueing for safety;Cueing for sequencing       Functional mobility during ADLs: Minimal assistance;Moderate assistance;Cueing for safety;Cueing for sequencing;Rolling walker                 Pertinent Vitals/Pain Pain Assessment: No/denies pain Pain Location: only  complained of weakness     Hand Dominance     Extremity/Trunk Assessment Upper Extremity Assessment Upper Extremity Assessment: Generalized weakness   Lower Extremity Assessment Lower Extremity Assessment: Overall WFL for tasks assessed (LEs gatigue quickly)       Communication Communication Communication: No difficulties   Cognition Arousal/Alertness: Awake/alert Behavior During Therapy: WFL for tasks assessed/performed Overall Cognitive Status: Within Functional Limits for tasks assessed                                Home Living Family/patient expects to be discharged to:: Private residence Living Arrangements: Alone Available Help at Discharge: Family;Available PRN/intermittently Type of Home: House Home Access: Level entry     Home Layout: One level     Bathroom Shower/Tub: Occupational psychologist: Handicapped height     Home Equipment: Environmental consultant - 2 wheels;Bedside commode;Grab bars - tub/shower;Grab bars - toilet;Shower seat   Additional Comments: 2 local children  Prior Functioning/Environment Level of Independence: Independent with assistive device(s)        Comments: drives; recently began using walker again        OT Problem List: Decreased strength;Decreased activity tolerance;Decreased safety awareness   OT Treatment/Interventions: Self-care/ADL training;DME and/or AE instruction    OT  Goals(Current goals can be found in the care plan section) Acute Rehab OT Goals Patient Stated Goal: home  OT Frequency: Min 2X/week   Barriers to D/C: Decreased caregiver support          Co-evaluation   Reason for Co-Treatment: For patient/therapist safety PT goals addressed during session: Mobility/safety with mobility        End of Session Equipment Utilized During Treatment: Rolling walker Nurse Communication: Mobility status  Activity Tolerance: Patient tolerated treatment well Patient left: in chair;with call bell/phone within reach;with chair alarm set   Time: 1341-1408 OT Time Calculation (min): 27 min Charges:  OT General Charges $OT Visit: 1 Procedure OT Evaluation $OT Eval Moderate Complexity: 1 Procedure G-Codes:    Payton Mccallum D 06/17/16, 2:56 PM

## 2016-06-02 NOTE — Progress Notes (Signed)
Yolanda James 11:33 AM  Subjective: Patient seen and examined and her hospital computer chart reviewed and her case discussed with my partner Dr. Oletta Lamas and currently the patient has no GI complaints although she does not like clear liquids she has not had a bowel movement today and has no signs of further bleeding  Objective: Vital signs stable afebrile no acute distress abdomen is soft nontender BUN and hemoglobin stable INR decreased  Assessment: GI blood loss probably from over anticoagulation  Plan: Okay to advance diet later today if no signs of further bleeding and please call us if we can be of any further assistance during this hospital stay  [facility]  Pager [phone] After 5PM or if no answer call [phone]

## 2016-06-02 NOTE — Progress Notes (Signed)
Taking over care of patient, agree with previous RN assessment. Patient denies any needs at this time will continue to monitor.  

## 2016-06-02 NOTE — Progress Notes (Signed)
IP PROGRESS NOTE  Subjective:   Yolanda James completed a first cycle of Taxol/carboplatin for treatment of a GYN malignancy on 05/23/2016. She was seen in the symptom management clinic on 05/28/2016 with a complaint of blood in the stool and diarrhea. She has a history of "hemorrhoids ". She underwent a paracentesis on 05/28/2016 for 1.5 L of fluid. Yolanda James presented to the emergency room on 05/31/2016 with rectal bleeding and general Weakness. She was noted to have severe neutropenia and a fever. She was admitted for further evaluation. She continues to feel "weak ".   Objective: Vital signs in last 24 hours: Blood pressure 109/60, pulse 99, temperature 97.6 F (36.4 C), temperature source Oral, resp. rate 14, height 5\' 6"  (1.676 m), weight 216 lb 4.3 oz (98.1 kg), SpO2 94 %.  Intake/Output from previous day: 11/12 0701 - 11/13 0700 In: 3158.8 [I.V.:2608.8; IV Piggyback:550] Out: [id number] [Urine:465]  Physical Exam:  HEENT: No thrush Lungs: Decreased breath sounds at the bases, no respiratory distress Cardiac: Irregular Abdomen: Distended, nontender Extremities: No leg edema    Lab Results:  Recent Labs  06/01/16 2010 06/02/16 0334  WBC 2.4* 3.4*  HGB 9.2* 9.1*  HCT 27.7* 28.4*  PLT 87* 89*   ANC-2.3 BMET  Recent Labs  06/01/16 0049 06/02/16 0334  NA 131* 132*  K 3.7 3.6  CL 97* 103  CO2 25 24  GLUCOSE 126* 125*  BUN 25* 27*  CREATININE 0.79 0.91  CALCIUM 7.8* 7.3*    Studies/Results: Dg Chest 2 View  Result Date: 05/31/2016 CLINICAL DATA:  Pt states she is "wore out". Recent chemo treatment for ovarian cancer. Denies any chest complaints. H/o CHF, Chronic a-fib, and type 2 diabetes. Former smoker. EXAM: CHEST  2 VIEW COMPARISON:  08/27/2015 FINDINGS: Small left pleural effusion. No focal consolidation. No pneumothorax. Stable cardiomediastinal silhouette. No acute osseous abnormality. IMPRESSION: Small left pleural effusion. Electronically Signed   By:  Kathreen Devoid   On: 05/31/2016 13:19   US Abdomen Complete  Result Date: 06/01/2016 CLINICAL DATA:  Patient with diffuse abdominal pain for 3 days. EXAM: ABDOMEN ULTRASOUND COMPLETE COMPARISON:  CT abdomen pelvis 05/07/2016. FINDINGS: Gallbladder: No gallstones or wall thickening visualized. No sonographic Murphy sign noted by sonographer. Common bile duct: Diameter: 5 mm Liver: No focal lesion identified. Within normal limits in parenchymal echogenicity. IVC: No abnormality visualized. Pancreas: Visualized portion unremarkable. Spleen: Size and appearance within normal limits. Right Kidney: Length: 10.6 cm. Echogenicity within normal limits. No mass or hydronephrosis visualized. Left Kidney: Length: 8.4 cm. Echogenicity within normal limits. No mass or hydronephrosis visualized. Abdominal aorta: No aneurysm visualized. Other findings: Small amount of ascites. IMPRESSION: Small amount of ascites. No acute process within the abdomen. Electronically Signed   By: Lovey Newcomer M.D.   On: 06/01/2016 13:36    Medications: I have reviewed the patient's current medications.  Assessment/Plan:  Assessment/Plan: 1. GYN malignancy presenting with Abdominal distention/anorexia ? CT of the abdomen/pelvis 05/07/2016 revealed ascites, omental caking, and a small left pleural effusion ? Elevated CA 125 ? Paracentesis 05/13/2016 with cytology positive for carcinoma ? Cycle 1 Taxol/carboplatin 05/23/2016  2. Atrial fibrillation -maintained on Coumadin, PT/INR supratherapeutic on admission 05/31/2016  3. Diabetes  4. Hypertension  5. Glaucoma  6.    Pancytopenia secondary chemotherapy  7.    Admission 05/31/2016 with fever in the setting of severe neutropenia, blood culture from 05/31/2016 positive for pseudomonas aeruginosa  Yolanda James is now at day 11 following cycle 1 Taxol/carboplatin.  She was admitted 05/31/2016 with weakness, fever, and severe neutropenia. Blood cultures are positive  for pseudomonas. No apparent source for the infection. The neutrophil count has recovered.  Recommendations: 1. Discontinue G-CSF after the dose today 2. Continue intravenous antibiotics for the pseudomonas, follow-up cultures sensitivity 3. Daily CBC/differential 4. Neulasta will be added with cycle 2 chemotherapy or we will switch her to a low dose weekly regimen 5. Consider alternative anticoagulation options as it may be difficult to regulate Coumadin in her case   LOS: 2 days   Betsy Coder, MD   06/02/2016, 12:52 PM

## 2016-06-02 NOTE — Evaluation (Signed)
Physical Therapy Evaluation Patient Details Name: Yolanda James MRN: [id number] DOB: 29-Oct-1930 Today's Date: 06/02/2016   History of Present Illness  DONALDEEN HLINKA is an 82 y.o. female adm with lower GI bleed Past medical history of ovarian ca On chemotherapy, A. Fib on chronic anticoag, HTN, DM, R THA  Clinical Impression  Pt admitted with above diagnosis. Pt currently with functional limitations due to the deficits listed below (see PT Problem List). * Pt will benefit from skilled PT to increase their independence and safety with mobility to allow discharge to the venue listed below.   Recommend SNF as pt is deconditioned and with limited support at home, pt with incr WOB for short gait distance today; will continue to follow     Follow Up Recommendations SNF    Equipment Recommendations  None recommended by PT    Recommendations for Other Services       Precautions / Restrictions Precautions Precautions: Fall Restrictions Weight Bearing Restrictions: No      Mobility  Bed Mobility Overal bed mobility: Needs Assistance Bed Mobility: Supine to Sit     Supine to sit: Mod assist     General bed mobility comments: incr time, assist to elevate trunk and position self  Transfers Overall transfer level: Needs assistance Equipment used: Rolling walker (2 wheeled) Transfers: Sit to/from Stand Sit to Stand: Min assist         General transfer comment: cues for safety  Ambulation/Gait Ambulation/Gait assistance: Min assist Ambulation Distance (Feet): 7 Feet Assistive device: Rolling walker (2 wheeled) Gait Pattern/deviations: Step-to pattern;Wide base of support     General Gait Details: cues for RW safety, incr WOB with HR up to 115, decr to 101 at rest  Stairs            Wheelchair Mobility    Modified Rankin (Stroke Patients Only)       Balance Overall balance assessment: Needs assistance Sitting-balance support: Feet supported;No upper  extremity supported Sitting balance-Leahy Scale: Fair     Standing balance support: Bilateral upper extremity supported Standing balance-Leahy Scale: Poor                               Pertinent Vitals/Pain      Home Living Family/patient expects to be discharged to:: Private residence Living Arrangements: Alone Available Help at Discharge: Family;Available PRN/intermittently Type of Home: House Home Access: Level entry     Home Layout: One level Home Equipment: Walker - 2 wheels;Bedside commode;Grab bars - tub/shower;Grab bars - toilet;Shower seat Additional Comments: 2 local children    Prior Function Level of Independence: Independent with assistive device(s)         Comments: drives; recently began using walker again     Hand Dominance        Extremity/Trunk Assessment   Upper Extremity Assessment: Defer to OT evaluation           Lower Extremity Assessment: Overall WFL for tasks assessed (LEs gatigue quickly)         Communication   Communication: No difficulties  Cognition Arousal/Alertness: Awake/alert Behavior During Therapy: WFL for tasks assessed/performed Overall Cognitive Status: Within Functional Limits for tasks assessed                      General Comments      Exercises     Assessment/Plan    PT Assessment Patient needs continued PT services  PT Problem List Decreased strength;Decreased activity tolerance;Decreased mobility;Decreased balance          PT Treatment Interventions DME instruction;Gait training;Functional mobility training;Therapeutic activities;Therapeutic exercise;Patient/family education    PT Goals (Current goals can be found in the Care Plan section)  Acute Rehab PT Goals Patient Stated Goal: home PT Goal Formulation: With patient Time For Goal Achievement: 06/09/16 Potential to Achieve Goals: Good    Frequency Min 3X/week   Barriers to discharge        Co-evaluation PT/OT/SLP  Co-Evaluation/Treatment: Yes Reason for Co-Treatment: For patient/therapist safety PT goals addressed during session: Mobility/safety with mobility         End of Session   Activity Tolerance: Patient tolerated treatment well Patient left: in chair;with call bell/phone within reach Nurse Communication: Mobility status         Time: [id number] PT Time Calculation (min) (ACUTE ONLY): 27 min   Charges:   PT Evaluation $PT Eval Moderate Complexity: 1 Procedure     PT G Codes:        Gregorio Worley 2016/06/29, 2:34 PM

## 2016-06-03 ENCOUNTER — Inpatient Hospital Stay (HOSPITAL_COMMUNITY): Payer: Medicare Other

## 2016-06-03 ENCOUNTER — Ambulatory Visit: Payer: Medicare Other | Admitting: Interventional Cardiology

## 2016-06-03 DIAGNOSIS — A4152 Sepsis due to Pseudomonas: Principal | ICD-10-CM

## 2016-06-03 DIAGNOSIS — A419 Sepsis, unspecified organism: Secondary | ICD-10-CM | POA: Diagnosis present

## 2016-06-03 DIAGNOSIS — D689 Coagulation defect, unspecified: Secondary | ICD-10-CM

## 2016-06-03 DIAGNOSIS — R7881 Bacteremia: Secondary | ICD-10-CM | POA: Diagnosis present

## 2016-06-03 LAB — GLUCOSE, CAPILLARY
GLUCOSE-CAPILLARY: 158 mg/dL — AB (ref 65–99)
GLUCOSE-CAPILLARY: 134 mg/dL — AB (ref 65–99)
GLUCOSE-CAPILLARY: 150 mg/dL — AB (ref 65–99)
GLUCOSE-CAPILLARY: 141 mg/dL — AB (ref 65–99)
Glucose-Capillary: 135 mg/dL — ABNORMAL HIGH (ref 65–99)

## 2016-06-03 LAB — CULTURE, BLOOD (ROUTINE X 2)

## 2016-06-03 LAB — CBC WITH DIFFERENTIAL/PLATELET
BASOS ABS: 0 10*3/uL (ref 0.0–0.1)
BASOS PCT: 0 %
Eosinophils Absolute: 0 10*3/uL (ref 0.0–0.7)
Eosinophils Relative: 0 %
HEMATOCRIT: 29.4 % — AB (ref 36.0–46.0)
Hemoglobin: 9.5 g/dL — ABNORMAL LOW (ref 12.0–15.0)
LYMPHS PCT: 3 %
Lymphs Abs: 0.3 10*3/uL — ABNORMAL LOW (ref 0.7–4.0)
MCH: 25.1 pg — ABNORMAL LOW (ref 26.0–34.0)
MCHC: 32.3 g/dL (ref 30.0–36.0)
MCV: 77.6 fL — AB (ref 78.0–100.0)
MONOS PCT: 14 %
Monocytes Absolute: 1.3 10*3/uL — ABNORMAL HIGH (ref 0.1–1.0)
NEUTROS ABS: 7.8 10*3/uL — AB (ref 1.7–7.7)
NEUTROS PCT: 83 %
PLATELETS: 82 10*3/uL — AB (ref 150–400)
RBC: 3.79 MIL/uL — ABNORMAL LOW (ref 3.87–5.11)
RDW: 16.6 % — ABNORMAL HIGH (ref 11.5–15.5)
WBC: 9.4 10*3/uL (ref 4.0–10.5)

## 2016-06-03 LAB — BASIC METABOLIC PANEL
ANION GAP: 9 (ref 5–15)
BUN: 26 mg/dL — ABNORMAL HIGH (ref 6–20)
CALCIUM: 7.6 mg/dL — AB (ref 8.9–10.3)
CO2: 22 mmol/L (ref 22–32)
Chloride: 102 mmol/L (ref 101–111)
Creatinine, Ser: 0.78 mg/dL (ref 0.44–1.00)
GLUCOSE: 165 mg/dL — AB (ref 65–99)
POTASSIUM: 3.6 mmol/L (ref 3.5–5.1)
Sodium: 133 mmol/L — ABNORMAL LOW (ref 135–145)

## 2016-06-03 LAB — PROTIME-INR
INR: 1.71
Prothrombin Time: 20.3 seconds — ABNORMAL HIGH (ref 11.4–15.2)

## 2016-06-03 MED ORDER — METOPROLOL TARTRATE 5 MG/5ML IV SOLN: 5.0000 mg | Freq: Four times a day (QID) | INTRAVENOUS | Status: DC | PRN

## 2016-06-03 MED ORDER — DEXTROSE 5 % IV SOLN
2.0000 g | Freq: Three times a day (TID) | INTRAVENOUS | Status: DC
  Administered 2016-06-03 – 2016-06-05 (×6): 2 g via INTRAVENOUS
  Filled 2016-06-03 (×7): qty 2

## 2016-06-03 MED ORDER — CIPROFLOXACIN HCL 500 MG PO TABS: 500.0000 mg | ORAL_TABLET | Freq: Two times a day (BID) | ORAL | Status: DC

## 2016-06-03 MED ORDER — QUETIAPINE FUMARATE 25 MG PO TABS
25.0000 mg | ORAL_TABLET | Freq: Every day | ORAL | Status: DC
  Administered 2016-06-03 – 2016-06-04 (×2): 25 mg via ORAL
  Filled 2016-06-03 (×2): qty 1

## 2016-06-03 MED ORDER — ENSURE ENLIVE PO LIQD
237.0000 mL | Freq: Two times a day (BID) | ORAL | Status: DC
  Administered 2016-06-03 – 2016-06-05 (×4): 237 mL via ORAL

## 2016-06-03 MED ORDER — ADULT MULTIVITAMIN W/MINERALS CH
1.0000 | ORAL_TABLET | Freq: Every day | ORAL | Status: DC
  Administered 2016-06-03 – 2016-06-05 (×3): 1 via ORAL
  Filled 2016-06-03 (×3): qty 1

## 2016-06-03 MED ORDER — POLYETHYLENE GLYCOL 3350 17 G PO PACK
17.0000 g | PACK | Freq: Every day | ORAL | Status: DC
  Administered 2016-06-03 – 2016-06-04 (×2): 17 g via ORAL
  Filled 2016-06-03 (×2): qty 1

## 2016-06-03 MED ORDER — PILOCARPINE HCL 4 % OP SOLN
1.0000 [drp] | Freq: Four times a day (QID) | OPHTHALMIC | Status: DC
  Administered 2016-06-03 – 2016-06-05 (×8): 1 [drp] via OPHTHALMIC
  Filled 2016-06-03: qty 15

## 2016-06-03 NOTE — Progress Notes (Signed)
MD aware of EKG and CT scan results.

## 2016-06-03 NOTE — Clinical Social Work Note (Signed)
Clinical Social Work Assessment  Patient Details  Name: Yolanda James MRN: [id number] Date of Birth: [birth year]  Date of referral:  06/03/16               Reason for consult:  Facility Placement, Discharge Planning                Permission sought to share information with:  Family Supports, Customer service manager, Case Manager Permission granted to share information::  Yes, Verbal Permission Granted  Name::      Counselling psychologist )  Agency::   (SNF's )  Relationship::   (Daughter )  Contact Information:   [phone])  Housing/Transportation Living arrangements for the past 2 months:  Single Family Home Source of Information:  Patient Patient Interpreter Needed:  None Criminal Activity/Legal Involvement Pertinent to Current Situation/Hospitalization:  No - Comment as needed Significant Relationships:  Adult Children, Other Family Members Lives with:  Self Do you feel safe going back to the place where you live?  No Need for family participation in patient care:  Yes (Comment)  Care giving concerns:  Patient admitted from home alone. Home is handicap accessible.    Social Worker assessment / plan:  MSW met with patient and her niece at bedside in reference to post-acute placement for SNF. MSW introduced MSW role and SNF process. MSW also reviewed and provided SNF list. Patient reported she was at [facility] and [facility] in Feb 2017 for STR. Patient is now interested in [facility] as her first option and [facility] as a second. No further concerns reported at this time. MSW will continue to follow pt and pt's family for continued support and to facilitate pt's dc needs once stable.   Employment status:  Retired Forensic scientist:  Medicare PT Recommendations:  [place] / Referral to community resources:  [place]  Patient/Family's Response to care: Pt a/o x4. Patient agreeable to SNF STR. Pt  and niece pleasant and appreciated social work intervention.   Patient/Family's Understanding of and Emotional Response to Diagnosis, Current Treatment, and Prognosis:  Patient and family aware of medical interventions.   Emotional Assessment Appearance:  Appears stated age Attitude/Demeanor/Rapport:   (Pleasant ) Affect (typically observed):  Accepting, Appropriate, Calm Orientation:  Oriented to Situation, Oriented to  Time, Oriented to Place, Oriented to Self Alcohol / Substance use:  Not Applicable Psych involvement (Current and /or in the community):  No (Comment)  Discharge Needs  Concerns to be addressed:  Denies Needs/Concerns at this time Readmission within the last 30 days:  No Current discharge risk:  Dependent with Mobility Barriers to Discharge:  Continued Medical Work up   Glendon Axe A 06/03/2016, 3:01 PM

## 2016-06-03 NOTE — Clinical Social Work Placement (Signed)
   CLINICAL SOCIAL WORK PLACEMENT  NOTE  Date:  06/03/2016  Patient Details  Name: Yolanda James MRN: [id number] Date of Birth: 09/27/1930  Clinical Social Work is seeking post-discharge placement for this patient at the [place] level of care (*CSW will initial, date and re-position this form in  chart as items are completed):  Yes   Patient/family provided with [facility] Work Department's list of facilities offering this level of care within the geographic area requested by the patient (or if unable, by the patient's family).  Yes   Patient/family informed of their freedom to choose among providers that offer the needed level of care, that participate in Medicare, Medicaid or managed care program needed by the patient, have an available bed and are willing to accept the patient.  Yes   Patient/family informed of [facility]'s ownership interest in [facility] and [facility], as well as of the fact that they are under no obligation to receive care at these facilities.  PASRR submitted to EDS on 06/03/16     PASRR number received on       Existing PASRR number confirmed on 06/03/16     FL2 transmitted to all facilities in geographic area requested by pt/family on 06/03/16     FL2 transmitted to all facilities within larger geographic area on       Patient informed that his/her managed care company has contracts with or will negotiate with certain facilities, including the following:            Patient/family informed of bed offers received.  Patient chooses bed at       Physician recommends and patient chooses bed at      Patient to be transferred to   on  .  Patient to be transferred to facility by       Patient family notified on   of transfer.  Name of family member notified:        PHYSICIAN Please sign FL2     Additional Comment:    _______________________________________________ Glendon Axe A 06/03/2016, 2:55  PM

## 2016-06-03 NOTE — Progress Notes (Signed)
CMT called states pt has increased heart rate, nurse caring for pt states she is stable and denies symptons, MD notified will cont to monitor pt for symptons. SRP, RN

## 2016-06-03 NOTE — Progress Notes (Signed)
IP PROGRESS NOTE  Subjective:   Yolanda James complains of dyspnea. The abdomen remains distended.   Objective: Vital signs in last 24 hours: Blood pressure 105/68, pulse 95, temperature 97.4 F (36.3 C), temperature source Oral, resp. rate 16, height 5\' 6"  (1.676 m), weight 216 lb 4.3 oz (98.1 kg), SpO2 96 %.  Intake/Output from previous day: 11/13 0701 - 11/14 0700 In: 2130.8 [P.O.:240; I.V.:1340.8; IV Piggyback:550] Out: 250 [Urine:250]  Physical Exam:  HEENT: No thrush Lungs: Decreased breath sounds at the bases, no respiratory distress Cardiac: Irregular Abdomen: Distended, nontender Extremities: No leg edema    Lab Results:  Recent Labs  06/02/16 0334 06/03/16 0321  WBC 3.4* 9.4  HGB 9.1* 9.5*  HCT 28.4* 29.4*  PLT 89* 82*  ANC-7.8 BMET  Recent Labs  06/02/16 0334 06/03/16 0321  NA 132* 133*  K 3.6 3.6  CL 103 102  CO2 24 22  GLUCOSE 125* 165*  BUN 27* 26*  CREATININE 0.91 0.78  CALCIUM 7.3* 7.6*    Studies/Results: Ct Abdomen Pelvis Wo Contrast  Result Date: 06/03/2016 CLINICAL DATA:  Drop in hemoglobin, evaluate for retroperitoneal bleed EXAM: CT ABDOMEN AND PELVIS WITHOUT CONTRAST TECHNIQUE: Multidetector CT imaging of the abdomen and pelvis was performed following the standard protocol without IV contrast. COMPARISON:  05/07/2016 FINDINGS: Lower chest: Moderate left pleural effusion. Small right pleural effusion. Hepatobiliary: Diminutive liver size with moderate perihepatic ascites. No focal gallbladder abnormality. Pancreas: Unremarkable. No pancreatic ductal dilatation or surrounding inflammatory changes. Spleen: Normal in size without focal abnormality. Adrenals/Urinary Tract: Adrenal glands are unremarkable. Kidneys are normal, without renal calculi, focal lesion, or hydronephrosis. Bladder is unremarkable. Stomach/Bowel: Stomach is within normal limits. Appendix appears normal. No evidence of bowel wall thickening, distention, or inflammatory  changes. Vascular/Lymphatic: Normal caliber abdominal aorta. Abdominal aortic atherosclerosis. No lymphadenopathy. Reproductive: Soft tissue density seen in the cul-de-sac of indeterminate etiology. Other: Small amount of abdominal and pelvic ascites. Heterogeneous masslike area in the left anterior omentum measuring approximately 4 x 2.5 cm better seen on prior abdominal CT dated 05/07/2016 concerning for malignancy. Musculoskeletal: Right total hip arthroplasty without failure complication. Generalized osteopenia. Degenerative disc disease with disc height loss at L4-5 and L5-S1. Grade 1 anterolisthesis of L5 on S1 with bilateral facet arthropathy. IMPRESSION: 1. Heterogeneous masslike area in the left anterior omentum measuring approximately 4 x 2.5 cm better seen on prior abdominal CT dated 05/07/2016 concerning for malignancy. Small amount of abdominal and pelvic ascites. 2. Moderate left and small right pleural effusion. 3.  Aortic Atherosclerosis (ICD10-170.0) Electronically Signed   By: Kathreen Devoid   On: 06/03/2016 11:16    Medications: I have reviewed the patient's current medications.  Assessment/Plan:  Assessment/Plan: 1. GYN malignancy presenting with Abdominal distention/anorexia ? CT of the abdomen/pelvis 05/07/2016 revealed ascites, omental caking, and a small left pleural effusion ? Elevated CA 125 ? Paracentesis 05/13/2016 with cytology positive for carcinoma ? Cycle 1 Taxol/carboplatin 05/23/2016  2. Atrial fibrillation -maintained on Coumadin, PT/INR supratherapeutic on admission 05/31/2016  3. Diabetes  4. Hypertension  5. Glaucoma  6.    Pancytopenia secondary chemotherapy-the white count has recovered  7.    Admission 05/31/2016 with fever in the setting of severe neutropenia, blood culture from 05/31/2016 positive for pseudomonas aeruginosa  8.   Dyspnea secondary to the distended abdomen, pleural effusions  9.   Report of rectal bleeding  10.   Anemia-Red cell microcytosis predated chemotherapy, chronic GI bleeding?  Recommendations: 1. Discontinue G-CSF and Decadron 2.  Continue intravenous antibiotics for the pseudomonas bacteremia 3. Neulasta will be added with cycle 2 chemotherapy or we will switch her to a low dose weekly regimen    LOS: 3 days   Betsy Coder, MD   06/03/2016, 2:09 PM

## 2016-06-03 NOTE — NC FL2 (Signed)
[place] LEVEL OF CARE SCREENING TOOL     IDENTIFICATION  Patient Name: Yolanda James Birthdate: Mar 14, 1931 Sex: female Admission Date (Current Location): 05/31/2016  [facility] and [place] Number:  Herbalist and Address:  [facility],  [address], [place]      Provider Number: [id number]  Attending Physician Name and Address:  Charlynne Cousins, MD  Relative Name and Phone Number:       Current Level of Care: Hospital Recommended Level of Care: [place] Prior Approval Number:    Date Approved/Denied:   PASRR Number:  [id number])  Discharge Plan: SNF    Current Diagnoses: Patient Active Problem List   Diagnosis Date Noted  . Sepsis ([place]) 06/03/2016  . Bacteremia due to Pseudomonas 06/03/2016  . Pancytopenia, acquired ([place])   . Bloody diarrhea 05/31/2016  . Gastrointestinal bleed 05/31/2016  . Drug-induced neutropenia ([place]) 05/31/2016  . Diarrhea 05/28/2016  . Hypomagnesemia 05/28/2016  . Ovarian cancer ([place]) 05/15/2016  . Ascites 05/15/2016  . Carcinomatosis ([place]) 05/15/2016  . Bilateral lower extremity edema 11/26/2015  . Arthritis of right hip 09/03/2015  . Primary osteoarthritis of right hip 09/02/2015  . Essential hypertension, benign   . Atrial fibrillation with RVR ([place])   . Pure hypercholesterolemia   . Encounter for therapeutic drug monitoring 08/18/2013  . Chronic atrial fibrillation (H[facility]) 05/26/2013    Orientation RESPIRATION BLADDER Height & Weight     Self, Time, Situation, Place  Normal Continent Weight: 216 lb 4.3 oz (98.1 kg) Height:  5\' 6"  (167.6 cm)  BEHAVIORAL SYMPTOMS/MOOD NEUROLOGICAL BOWEL NUTRITION STATUS   (none )  (none ) Continent Diet (Regular )  AMBULATORY STATUS COMMUNICATION OF NEEDS Skin   Limited Assist Verbally Normal                       Personal Care Assistance Level of Assistance  Bathing, Dressing, Feeding Bathing Assistance: Limited  assistance Feeding assistance: Limited assistance Dressing Assistance: Maximum assistance     Functional Limitations Info  Speech, Hearing, Sight Sight Info: Adequate Hearing Info: Adequate Speech Info: Adequate    SPECIAL CARE FACTORS FREQUENCY  PT (By licensed PT), OT (By licensed OT)     PT Frequency: 3 OT Frequency: 2            Contractures      Additional Factors Info  Code Status, Allergies Code Status Info: FULL CODE  Allergies Info: N/A           Current Medications (06/03/2016):  This is the current hospital active medication list Current Facility-Administered Medications  Medication Dose Route Frequency Provider Last Rate Last Dose  . 0.9 %  sodium chloride infusion   Intravenous Continuous Charlynne Cousins, MD 50 mL/hr at 06/03/16 0032    . acetaminophen (TYLENOL) tablet 650 mg  650 mg Oral Q4H PRN Nita Sells, MD      . cefTAZidime (FORTAZ) 2 g in dextrose 5 % 50 mL IVPB  2 g Intravenous Q8H Charlynne Cousins, MD      . diltiazem (CARDIZEM CD) 24 hr capsule 240 mg  240 mg Oral Daily Charlynne Cousins, MD   240 mg at 06/03/16 1106  . feeding supplement (ENSURE ENLIVE) (ENSURE ENLIVE) liquid 237 mL  237 mL Oral BID BM Charlynne Cousins, MD      . insulin aspart (novoLOG) injection 0-9 Units  0-9 Units Subcutaneous Q4H Nita Sells, MD  1 Units at 06/03/16 1336  . metoprolol (LOPRESSOR) injection 5 mg  5 mg Intravenous Q6H PRN Charlynne Cousins, MD      . metoprolol tartrate (LOPRESSOR) tablet 25 mg  25 mg Oral BID Nita Sells, MD   25 mg at 06/03/16 1000  . multivitamin with minerals tablet 1 tablet  1 tablet Oral Daily Charlynne Cousins, MD      . ondansetron [facility]) injection 4 mg  4 mg Intravenous Q6H PRN Nita Sells, MD      . ondansetron (ZOFRAN) tablet 8 mg  8 mg Oral Q8H PRN Nita Sells, MD      . pilocarpine (PILOCAR) 4 % ophthalmic solution 1 drop  1 drop Both Eyes QID Charlynne Cousins, MD   1  drop at 06/03/16 1106  . polyethylene glycol (MIRALAX / GLYCOLAX) packet 17 g  17 g Oral Daily Charlynne Cousins, MD   17 g at 06/03/16 1106  . QUEtiapine (SEROQUEL) tablet 25 mg  25 mg Oral QHS Charlynne Cousins, MD         Discharge Medications: Please see discharge summary for a list of discharge medications.  Relevant Imaging Results:  Relevant Lab Results:   Additional Information SSN [id number]  Yolanda James

## 2016-06-03 NOTE — Progress Notes (Signed)
Initial Nutrition Assessment  DOCUMENTATION CODES:   Obesity unspecified  INTERVENTION:  - Will order Ensure Enlive po BID, each supplement provides 350 kcal and 20 grams of protein - Will order daily multivitamin with minerals. - Continue to encourage PO intakes of meals and supplements. - RD will continue to monitor for additional nutrition-related needs.  NUTRITION DIAGNOSIS:   Increased nutrient needs related to catabolic illness, cancer and cancer related treatments as evidenced by estimated needs.  GOAL:   Patient will meet greater than or equal to 90% of their needs  MONITOR:   PO intake, Supplement acceptance, Weight trends, Labs, I & O's  REASON FOR ASSESSMENT:   Consult Assessment of nutrition requirement/status  ASSESSMENT:   82 y.o. female Past medical history of ovarian ca On chemotherapy, A. Fib on chronic Coumadin, Comes into the hospital with bright Red blood per rectum And melanotic stools. Her previous Hemoglobin on 05/20/2016 it was 13 on admission was 9, Her INR was supra therapeutic at > 10.0. In the ED she received IV vitamin k and fresh frozen plasma.She also has been having fevers at chemotherapy about a week ago she was leukopenic with thrombocytopenia. Blood cultures was positive  For pseudomonas aeruginosa, she is on double coverage with Zosyn and cipro.  Pt seen for consult. BMI indicates obesity. Diet changes as indicated: 11/11 @ [id number]: NPO 11/11 @ 1426: CLD 11/13 @ 1337: FLD 11/13 @ 1542: Regular   Per chart review, pt consumed 50% of breakfast and 10% of dinner yesterday and no other documentation since admission. Pt reports she ordered eggs, grits, toast, and coffee for breakfast but states that she only ate a few bites of each item before it became too cold and she did not want to eat any more. She reports that she did not sleep at all yesterday or last night.   Pt is difficult to redirect at times as she talks about several points of her  medical hx and associated medications, doctor's appointments, etc. She states that since her chemo treatment on 11/3 she has been having diarrhea but no other information about potential side effects from chemo able to be obtained. She does report that her last 2 paracenteses removed 2.5 L and 1.4 L, respectively. Pt denies chewing or swallowing issues. She states that her appetite has been decreased, but unable to determine a time frame for this. She states that PTA she was trying to make healthy choices but unable to obtain further information on this.  Physical assessment shows no muscle or fat wasting. Moderate edema present. Per chart review, weight has fluctuated frequently over the past 2 months; unsure if weight changes are related to lean body mass loss versus fluid changes. Will continue to monitor weight changes during hospitalization.  Medications reviewed; sliding scale Novolog, PRN Zofran, 17 g Miralax/day. Labs reviewed; CBGs: 134-158 mg/dL today, Na: 133 mmol/L, BUN: 26 mg/dL, Ca: 7.6 mg/dL.  IVF: NS @ 50 mL/hr.    Diet Order:  Diet regular Room service appropriate? Yes; Fluid consistency: Thin Diet NPO time specified  Skin:  Reviewed, no issues  Last BM:  11/12  Height:   Ht Readings from Last 1 Encounters:  05/31/16 5\' 6"  (1.676 m)    Weight:   Wt Readings from Last 1 Encounters:  05/31/16 216 lb 4.3 oz (98.1 kg)    Ideal Body Weight:  59.09 kg  BMI:  Body mass index is 34.91 kg/m.  Estimated Nutritional Needs:   Kcal:  [id number] (18-20  kcal/kg)  Protein:  83-95 grams (1.4-1.6 grams/kg IBW)  Fluid:  >/= 1.9 L/day  EDUCATION NEEDS:   No education needs identified at this time    Jarome Matin, MS, RD, LDN Inpatient Clinical Dietitian Pager # [phone] After hours/weekend pager # [phone]

## 2016-06-03 NOTE — Progress Notes (Addendum)
[facility] PROGRESS NOTE    Progress Note  ABBIEGAIL James  [id number] DOB: 04-18-1931 DOA: 05/31/2016 PCP: [name], MD     Brief Narrative:   Yolanda James is an 82 y.o. female Past medical history of ovarian ca On chemotherapy, A. Fib on chronic Coumadin, Comes into the hospital with bright Red blood per rectum And melanotic stools. Her previous Hemoglobin on 05/20/2016 it was 13 on admission was 9, Her INR was supra therapeutic at > 10.0. In the ED she received IV vitamin k and fresh frozen plasma.She also has been having fevers last chemotherapy about a week ago she was leukopenic with thrombocytopenia. Blood cultures was positive  For pseudomonas aeruginosa, she was started coverage with Zosyn and cipro on 11.12.2017 now de-escalated to [place] on 11.13.2017. On admission she was found on A.fib with RVR.  Which has now been changed to oral diltiazem, home dose metoprolol was continue and IV metoprolol when necessary.   Assessment/Plan:   Acute lower GI bleed/acute blood loss anemia: GI has been consulted, she recieved fresh frozen plasma, her INR down to  <2.  Continue clear liquid diet, orthostatics negative, her baseline hemoglobin is 13 now it's 9. Hbg down to 4 g drop from 05/20/2016. GI saw patient and deferred intervention as patient was not bleeding. The patient and the nurse today relate that she had black stools or INR is 1.7, I will go ahead and repeat a CBC today. I have reconsulted [facility]. NPO after midnight. Platelets are still greater than 50,000 and trending up.  Thrombocytopenia: Likely due to chemotherapy, if platelets go below 50,000 transfuse platelets. Continue to trend.  A. fib with RVR: Off diltiazem drip, his heart rate now is< 100s,  HR controlled. On oral home meds  Pseudomonas bacteremia/Neutropenic fever: She was started on empiric Zosyn on 05/31/2016, Continue Tressie Ellis for now. Has resolved, ANC trending up DC Neupogen. Blood  cultures ID panel + Pseudomonas, and culture showed it was sensitive to ciprofloxacin. Will need 2 weeks of total antibiotics starting on 05/31/2016.  Diabetes mellitus type 2: Agree with holding oral hypoglycemic agents. Continue on clear liquid diet and sliding scale insulin.  Recent diagnosis of ovarian cancer: D/ c decadron. Oncology was consulted.  Drug-induced neutropenia (HCC) Continue Neupogen. CBCs daily.  Severe protein caloric malnutrition: Continue ensure 3 times a day.  Abdominal pain: Now resolved, Abdominal ultrasound rule out cholecystitis.  DVT prophylaxis: SCD Family Communication:none Disposition Plan/Barrier to D/C: Home in 1-2 days Code Status:     Code Status Orders        Start     Ordered   05/31/16 1426  Full code  Continuous     05/31/16 1425    Code Status History    Date Active Date Inactive Code Status Order ID Comments User Context   This patient has a current code status but no historical code status.    Advance Directive Documentation   Flowsheet Row Most Recent Value  Type of Advance Directive  Healthcare Power of Attorney  Pre-existing out of facility DNR order (yellow form or pink MOST form)  No data  "MOST" Form in Place?  No data        IV Access:    Peripheral IV   Procedures and diagnostic studies:   US Abdomen Complete  Result Date: 06/01/2016 CLINICAL DATA:  Patient with diffuse abdominal pain for 3 days. EXAM: ABDOMEN ULTRASOUND COMPLETE COMPARISON:  CT abdomen pelvis 05/07/2016. FINDINGS: Gallbladder: No gallstones  or wall thickening visualized. No sonographic Murphy sign noted by sonographer. Common bile duct: Diameter: 5 mm Liver: No focal lesion identified. Within normal limits in parenchymal echogenicity. IVC: No abnormality visualized. Pancreas: Visualized portion unremarkable. Spleen: Size and appearance within normal limits. Right Kidney: Length: 10.6 cm. Echogenicity within normal limits. No mass or  hydronephrosis visualized. Left Kidney: Length: 8.4 cm. Echogenicity within normal limits. No mass or hydronephrosis visualized. Abdominal aorta: No aneurysm visualized. Other findings: Small amount of ascites. IMPRESSION: Small amount of ascites. No acute process within the abdomen. Electronically Signed   By: Lovey Newcomer M.D.   On: 06/01/2016 13:36     Medical Consultants:    None.  Anti-Infectives:   Zosyn  Subjective:    Yolanda James  Feels better than yesterday.She relates she had melanotic stools this morning.  Objective:    Vitals:   06/03/16 0413 06/03/16 0549 06/03/16 1000 06/03/16 1106  BP: 119/71  115/72 115/72  Pulse: 100  78   Resp: 18     Temp: 97.3 F (36.3 C)     TempSrc: Oral     SpO2: 95% 98%    Weight:      Height:        Intake/Output Summary (Last 24 hours) at 06/03/16 1117 Last data filed at 06/03/16 1000  Gross per 24 hour  Intake             2000 ml  Output              250 ml  Net             1750 ml   Filed Weights   05/31/16 1355  Weight: 98.1 kg (216 lb 4.3 oz)    Exam: General exam: In no acute distress. Respiratory system: Good air movement and clear to auscultation. Cardiovascular system: S1 & S2 heard, RRR. Gastrointestinal system: Abdomen is nondistended, soft and nontender.  Extremities: No pedal edema. Skin: No rashes, lesions or ulcers Psychiatry: Judgement and insight appear normal. Mood & affect appropriate.    Data Reviewed:    Labs: Basic Metabolic Panel:  Recent Labs Lab 05/28/16 0919  05/31/16 0846 06/01/16 0049 06/02/16 0334 06/03/16 0321  NA 137  --  133* 131* 132* 133*  K 3.8  < > 4.1 3.7 3.6 3.6  CL  --   --  97* 97* 103 102  CO2 27  --  26 25 24 22   GLUCOSE 104  --  143* 126* 125* 165*  BUN 24.6  --  23* 25* 27* 26*  CREATININE 0.7  --  0.79 0.79 0.91 0.78  CALCIUM 8.2*  --  8.2* 7.8* 7.3* 7.6*  MG 1.3*  --   --   --   --   --   < > = values in this interval not displayed. GFR Estimated  Creatinine Clearance: 62.9 mL/min (by C-G formula based on SCr of 0.78 mg/dL). Liver Function Tests:  Recent Labs Lab 05/28/16 0919 05/31/16 0846  AST 42* 36  ALT 33 27  ALKPHOS 152* 109  BILITOT 0.77 1.2  PROT 5.7* 5.8*  ALBUMIN 2.4* 2.7*    Recent Labs Lab 05/31/16 0846  LIPASE 11   No results for input(s): AMMONIA in the last 168 hours. Coagulation profile  Recent Labs Lab 05/31/16 0858 05/31/16 1334 06/01/16 0049 06/01/16 2010 06/03/16 0321  INR >10.00* >10.00* 2.05 1.92 1.71    CBC:  Recent Labs Lab 05/31/16 0900 05/31/16 1334 06/01/16 0049  06/01/16 0747 06/01/16 2010 06/02/16 0334 06/03/16 0321  WBC 0.5* 0.4* 1.0* 1.4* 2.4* 3.4* 9.4  NEUTROABS 0.1* 0.0* 0.3*  --   --  2.3 7.8*  HGB 11.5* 10.6* 9.2* 9.2* 9.2* 9.1* 9.5*  HCT 35.7* 33.1* 28.0* 28.1* 27.7* 28.4* 29.4*  MCV 78.1 77.5* 75.9* 76.2* 76.7* 78.7 77.6*  PLT 138* 122* 86* 79* 87* 89* 82*   Cardiac Enzymes: No results for input(s): CKTOTAL, CKMB, CKMBINDEX, TROPONINI in the last 168 hours. BNP (last 3 results) No results for input(s): PROBNP in the last 8760 hours. CBG:  Recent Labs Lab 06/02/16 1633 06/02/16 2049 06/02/16 2359 06/03/16 0334 06/03/16 0722  GLUCAP 139* 145* 149* 158* 134*   D-Dimer: No results for input(s): DDIMER in the last 72 hours. Hgb A1c: No results for input(s): HGBA1C in the last 72 hours. Lipid Profile: No results for input(s): CHOL, HDL, LDLCALC, TRIG, CHOLHDL, LDLDIRECT in the last 72 hours. Thyroid function studies: No results for input(s): TSH, T4TOTAL, T3FREE, THYROIDAB in the last 72 hours.  Invalid input(s): FREET3 Anemia work up: No results for input(s): VITAMINB12, FOLATE, FERRITIN, TIBC, IRON, RETICCTPCT in the last 72 hours. Sepsis Labs:  Recent Labs Lab 06/01/16 0747 06/01/16 2010 06/02/16 0334 06/03/16 0321  WBC 1.4* 2.4* 3.4* 9.4   Microbiology Recent Results (from the past 240 hour(s))  TECHNOLOGIST REVIEW     Status: None    Collection Time: 05/28/16  9:19 AM  Result Value Ref Range Status   Technologist Review Rare metamyelocyte  Final  Culture, blood (routine x 2)     Status: Abnormal   Collection Time: 05/31/16  1:20 PM  Result Value Ref Range Status   Specimen Description BLOOD RIGHT ANTECUBITAL  Final   Special Requests BOTTLES DRAWN AEROBIC ONLY 6CC  Final   Culture  Setup Time   Final    GRAM NEGATIVE RODS AEROBIC BOTTLE ONLY CRITICAL RESULT CALLED TO, READ BACK BY AND VERIFIED WITH: D WOFFORD,PHARMD AT 1253 06/01/16 BY L BENFIELD    Culture (A)  Final    PSEUDOMONAS AERUGINOSA SUSCEPTIBILITIES PERFORMED ON PREVIOUS CULTURE WITHIN THE LAST 5 DAYS. Performed at [facility]    Report Status 06/03/2016 FINAL  Final  Culture, blood (routine x 2)     Status: Abnormal   Collection Time: 05/31/16  1:32 PM  Result Value Ref Range Status   Specimen Description BLOOD RIGHT ANTECUBITAL  Final   Special Requests BOTTLES DRAWN AEROBIC ONLY 5CC  Final   Culture  Setup Time   Final    GRAM NEGATIVE RODS AEROBIC BOTTLE ONLY CRITICAL RESULT CALLED TO, READ BACK BY AND VERIFIED WITH: D WOFFORD,PHARMD AT 1253 06/01/16 BY L BENFIELD Performed at [facility] (A)  Final   Report Status 06/03/2016 FINAL  Final   Organism ID, Bacteria PSEUDOMONAS AERUGINOSA  Final      Susceptibility   Pseudomonas aeruginosa - MIC*    CEFTAZIDIME 4 SENSITIVE Sensitive     CIPROFLOXACIN <=0.25 SENSITIVE Sensitive     GENTAMICIN 2 SENSITIVE Sensitive     IMIPENEM 2 SENSITIVE Sensitive     PIP/TAZO 16 SENSITIVE Sensitive     CEFEPIME 4 SENSITIVE Sensitive     * PSEUDOMONAS AERUGINOSA  Blood Culture ID Panel (Reflexed)     Status: Abnormal   Collection Time: 05/31/16  1:32 PM  Result Value Ref Range Status   Enterococcus species NOT DETECTED NOT DETECTED Final   Listeria monocytogenes NOT DETECTED NOT  DETECTED Final   Staphylococcus species NOT DETECTED NOT DETECTED Final    Staphylococcus aureus NOT DETECTED NOT DETECTED Final   Streptococcus species NOT DETECTED NOT DETECTED Final   Streptococcus agalactiae NOT DETECTED NOT DETECTED Final   Streptococcus pneumoniae NOT DETECTED NOT DETECTED Final   Streptococcus pyogenes NOT DETECTED NOT DETECTED Final   Acinetobacter baumannii NOT DETECTED NOT DETECTED Final   Enterobacteriaceae species NOT DETECTED NOT DETECTED Final   Enterobacter cloacae complex NOT DETECTED NOT DETECTED Final   Escherichia coli NOT DETECTED NOT DETECTED Final   Klebsiella oxytoca NOT DETECTED NOT DETECTED Final   Klebsiella pneumoniae NOT DETECTED NOT DETECTED Final   Proteus species NOT DETECTED NOT DETECTED Final   Serratia marcescens NOT DETECTED NOT DETECTED Final   Carbapenem resistance NOT DETECTED NOT DETECTED Final   Haemophilus influenzae NOT DETECTED NOT DETECTED Final   Neisseria meningitidis NOT DETECTED NOT DETECTED Final   Pseudomonas aeruginosa DETECTED (A) NOT DETECTED Final    Comment: CRITICAL RESULT CALLED TO, READ BACK BY AND VERIFIED WITH: D WOFFORD,PHARMD AT 1253 06/01/16 BY L BENFIELD    Candida albicans NOT DETECTED NOT DETECTED Final   Candida glabrata NOT DETECTED NOT DETECTED Final   Candida krusei NOT DETECTED NOT DETECTED Final   Candida parapsilosis NOT DETECTED NOT DETECTED Final   Candida tropicalis NOT DETECTED NOT DETECTED Final    Comment: Performed at [facility]  MRSA PCR Screening     Status: None   Collection Time: 05/31/16  2:34 PM  Result Value Ref Range Status   MRSA by PCR NEGATIVE NEGATIVE Final    Comment:        The GeneXpert MRSA Assay (FDA approved for NASAL specimens only), is one component of a comprehensive MRSA colonization surveillance program. It is not intended to diagnose MRSA infection nor to guide or monitor treatment for MRSA infections.   C difficile quick scan w PCR reflex     Status: None   Collection Time: 05/31/16 11:30 PM  Result Value Ref Range  Status   C Diff antigen NEGATIVE NEGATIVE Final   C Diff toxin NEGATIVE NEGATIVE Final   C Diff interpretation No C. difficile detected.  Final  Gastrointestinal Panel by PCR , Stool     Status: None   Collection Time: 05/31/16 11:30 PM  Result Value Ref Range Status   Campylobacter species NOT DETECTED NOT DETECTED Final   Plesimonas shigelloides NOT DETECTED NOT DETECTED Final   Salmonella species NOT DETECTED NOT DETECTED Final   Yersinia enterocolitica NOT DETECTED NOT DETECTED Final   Vibrio species NOT DETECTED NOT DETECTED Final   Vibrio cholerae NOT DETECTED NOT DETECTED Final   Enteroaggregative E coli (EAEC) NOT DETECTED NOT DETECTED Final   Enteropathogenic E coli (EPEC) NOT DETECTED NOT DETECTED Final   Enterotoxigenic E coli (ETEC) NOT DETECTED NOT DETECTED Final   Shiga like toxin producing E coli (STEC) NOT DETECTED NOT DETECTED Final   Shigella/Enteroinvasive E coli (EIEC) NOT DETECTED NOT DETECTED Final   Cryptosporidium NOT DETECTED NOT DETECTED Final   Cyclospora cayetanensis NOT DETECTED NOT DETECTED Final   Entamoeba histolytica NOT DETECTED NOT DETECTED Final   Giardia lamblia NOT DETECTED NOT DETECTED Final   Adenovirus F40/41 NOT DETECTED NOT DETECTED Final   Astrovirus NOT DETECTED NOT DETECTED Final   Norovirus GI/GII NOT DETECTED NOT DETECTED Final   Rotavirus A NOT DETECTED NOT DETECTED Final   Sapovirus (I, II, IV, and V) NOT DETECTED NOT DETECTED Final  Medications:   . cefTAZidime (FORTAZ)  IV  2 g Intravenous Q8H  . diltiazem  240 mg Oral Daily  . insulin aspart  0-9 Units Subcutaneous Q4H  . metoprolol tartrate  25 mg Oral BID  . pilocarpine  1 drop Both Eyes QID  . polyethylene glycol  17 g Oral Daily  . QUEtiapine  25 mg Oral QHS   Continuous Infusions: . sodium chloride 50 mL/hr at 06/03/16 0032    Time spent: 25 min   LOS: 3 days   Charlynne Cousins  [facility] Pager [phone]  *Please refer to [email or web address], password  [email or web address] to get updated schedule on who will round on this patient, as hospitalists switch teams weekly. If 7PM-7AM, please contact night-coverage at [email or web address], password [email or web address] for any overnight needs.  06/03/2016, 11:17 AM

## 2016-06-04 ENCOUNTER — Ambulatory Visit (HOSPITAL_COMMUNITY): Payer: Medicare Other

## 2016-06-04 LAB — CBC WITH DIFFERENTIAL/PLATELET
Basophils Absolute: 0 10*3/uL (ref 0.0–0.1)
Basophils Relative: 0 %
EOS PCT: 0 %
Eosinophils Absolute: 0 10*3/uL (ref 0.0–0.7)
HEMATOCRIT: 32.1 % — AB (ref 36.0–46.0)
HEMOGLOBIN: 10.3 g/dL — AB (ref 12.0–15.0)
LYMPHS PCT: 2 %
Lymphs Abs: 0.3 10*3/uL — ABNORMAL LOW (ref 0.7–4.0)
MCH: 24.8 pg — ABNORMAL LOW (ref 26.0–34.0)
MCHC: 32.1 g/dL (ref 30.0–36.0)
MCV: 77.2 fL — ABNORMAL LOW (ref 78.0–100.0)
MONOS PCT: 11 %
Monocytes Absolute: 1.7 10*3/uL — ABNORMAL HIGH (ref 0.1–1.0)
NEUTROS PCT: 87 %
Neutro Abs: 13.5 10*3/uL — ABNORMAL HIGH (ref 1.7–7.7)
Platelets: 91 10*3/uL — ABNORMAL LOW (ref 150–400)
RBC: 4.16 MIL/uL (ref 3.87–5.11)
RDW: 16.7 % — ABNORMAL HIGH (ref 11.5–15.5)
WBC: 15.5 10*3/uL — AB (ref 4.0–10.5)

## 2016-06-04 LAB — BASIC METABOLIC PANEL
ANION GAP: 9 (ref 5–15)
BUN: 25 mg/dL — ABNORMAL HIGH (ref 6–20)
CALCIUM: 8 mg/dL — AB (ref 8.9–10.3)
CHLORIDE: 103 mmol/L (ref 101–111)
CO2: 25 mmol/L (ref 22–32)
Creatinine, Ser: 0.91 mg/dL (ref 0.44–1.00)
GFR calc non Af Amer: 57 mL/min — ABNORMAL LOW (ref >60)
Glucose, Bld: 129 mg/dL — ABNORMAL HIGH (ref 65–99)
Potassium: 3.1 mmol/L — ABNORMAL LOW (ref 3.5–5.1)
SODIUM: 137 mmol/L (ref 135–145)

## 2016-06-04 LAB — GLUCOSE, CAPILLARY
GLUCOSE-CAPILLARY: 138 mg/dL — AB (ref 65–99)
GLUCOSE-CAPILLARY: 147 mg/dL — AB (ref 65–99)
GLUCOSE-CAPILLARY: 147 mg/dL — AB (ref 65–99)
GLUCOSE-CAPILLARY: 153 mg/dL — AB (ref 65–99)
Glucose-Capillary: 127 mg/dL — ABNORMAL HIGH (ref 65–99)
Glucose-Capillary: 142 mg/dL — ABNORMAL HIGH (ref 65–99)
Glucose-Capillary: 127 mg/dL — ABNORMAL HIGH (ref 65–99)

## 2016-06-04 LAB — PROTIME-INR
INR: 1.66
PROTHROMBIN TIME: 19.8 s — AB (ref 11.4–15.2)

## 2016-06-04 MED ORDER — POTASSIUM CHLORIDE CRYS ER 20 MEQ PO TBCR
40.0000 meq | EXTENDED_RELEASE_TABLET | Freq: Once | ORAL | Status: AC
  Administered 2016-06-04: 40 meq via ORAL
  Filled 2016-06-04: qty 2

## 2016-06-04 MED ORDER — METOPROLOL TARTRATE 50 MG PO TABS
50.0000 mg | ORAL_TABLET | Freq: Two times a day (BID) | ORAL | Status: DC
  Administered 2016-06-04 – 2016-06-05 (×2): 50 mg via ORAL
  Filled 2016-06-04 (×2): qty 1

## 2016-06-04 NOTE — Progress Notes (Signed)
Occupational Therapy Treatment Patient Details Name: Yolanda James MRN: [id number] DOB: May 11, 1931 Today's Date: 06/04/2016    History of present illness Yolanda James is an 82 y.o. female adm with lower GI bleed Past medical history of ovarian ca On chemotherapy, A. Fib on chronic anticoag, HTN, DM, R THA   OT comments  Pt needed increased A this day and with increase confusion.  RN aware  Follow Up Recommendations  SNF    Equipment Recommendations  None recommended by OT    Recommendations for Other Services      Precautions / Restrictions Precautions Precautions: Fall Restrictions Weight Bearing Restrictions: No       Mobility Bed Mobility   Bed Mobility: Sit to Supine       Sit to supine: Mod assist      Transfers Overall transfer level: Needs assistance Equipment used: Rolling walker (2 wheeled) Transfers: Sit to/from [facility] Sit to Stand: Mod assist Stand pivot transfers: Mod assist       General transfer comment: cues for safety    Balance                                   ADL Overall ADL's : Needs assistance/impaired     Grooming: Brushing hair;Set up;Sitting;Wash/dry face                   Toilet Transfer: Moderate assistance;Cueing for sequencing;Cueing for safety;RW;BSC   Toileting- Clothing Manipulation and Hygiene: Maximal assistance;Cueing for safety;Cueing for sequencing;Cueing for compensatory techniques;Sit to/from stand         General ADL Comments: pt needed increased A this day.  Pt also with confusion.  Rn aware.                  Cognition   Behavior During Therapy: WFL for tasks assessed/performed Overall Cognitive Status: Impaired/Different from baseline Area of Impairment: Orientation;Memory;Safety/judgement;Awareness;Problem solving Orientation Level: Situation;Place        Safety/Judgement: Decreased awareness of safety                       Pertinent  Vitals/ Pain       Pain Assessment: No/denies pain     Prior Functioning/Environment              Frequency  Min 2X/week        Progress Toward Goals  OT Goals(current goals can now be found in the care plan section)  Progress towards OT goals: Progressing toward goals     Plan Discharge plan needs to be updated       End of Session Equipment Utilized During Treatment: Rolling walker   Activity Tolerance Patient tolerated treatment well   Patient Left with call bell/phone within reach;in bed;with bed alarm set;with nursing/sitter in room   Nurse Communication Mobility status        Time: 1250-1305 OT Time Calculation (min): 15 min  Charges: OT General Charges $OT Visit: 1 Procedure OT Treatments $Self Care/Home Management : 8-22 mins  Tyshell Ramberg, Edwena Felty D 06/04/2016, 1:10 PM

## 2016-06-04 NOTE — Care Management Important Message (Signed)
Important Message  Patient Details  Name: Yolanda James MRN: [id number] Date of Birth: 10-21-1930   Medicare Important Message Given:  Yes    Camillo Flaming 06/04/2016, 9:18 AMImportant Message  Patient Details  Name: Yolanda James MRN: [id number] Date of Birth: [birth year]   Medicare Important Message Given:  Yes    Camillo Flaming 06/04/2016, 9:18 AM

## 2016-06-04 NOTE — Progress Notes (Addendum)
Yolanda James 9:57 AM  Subjective: Patient is having a little confusion today but no GI complaints and she has not seen any blood and her case discussed with the hospital team the last few days and she has enjoyed eating a regular diet  Objective: Vital signs stable afebrile no acute distress increased hemoglobin increased white count question will etiology BUN unchanged CT without significant GI lesion  Assessment: GI bleed with over anticoagulation  Plan: Since hemoglobin has gone up nicely the last 2 days and since the CT does not show anything significant and since she is denying any bleeding right now we will allow her to eat and continue to follow at a distance and happy to see as an outpatient if oncology would like and care with resumption of blood thinners and consider other options to Coumadin or follow INR closely particularly if chemotherapy is to continue and call me sooner if I could be of any further assistance this week  Pager [phone] After 5PM or if no answer call [phone]

## 2016-06-04 NOTE — Progress Notes (Signed)
[facility] PROGRESS NOTE   Progress Note  Yolanda James  [id number] DOB: 1930/11/29 DOA: 05/31/2016 PCP: [name], MD   Brief Narrative:   Yolanda James is an 82 y.o. female Past medical history of ovarian CA on chemotherapy, A. Fib on chronic coumadin who presented on 11/11 [facility]BPR and melena. Hgb was 9 from previous of 13, INR > 10. In the ED she received IV vitamin K and FFP.She also has been having fevers last chemotherapy about a week ago she was leukopenic with thrombocytopenia. Blood cultures was positive for pseudomonas aeruginosa, she was started coverage with zosyn and cipro on 11.12.2017, de-escalated to [place] on 11.13.2017. On admission she was found on A.fib with RVR initially requiring diltiazem gtt, which has been changed to her home medications.   Assessment/Plan:   Acute lower GI bleed/acute blood loss anemia: Resolved. In setting of supratherapeutic INR which has resolved s/p FFP and Vit K. Hemoglobin stable since transfusions indicating no active GI bleed.  - GI plans no interventions, restart diet, advance as tolerated - Will discuss with GI and oncology Re: resuming anticoagulation. Could consider eliquis.   Thrombocytopenia: Likely due to chemotherapy - Transfuse for < 50k  - Continue to trend.  A. fib with RVR: Off diltiazem drip on home medications and HR creeping back up > 120's.  - Increase home metoprolol to 50mg  BID (from 25mg  BID) - Metoprolol IV prn HR > 110 bpm.  - CHADSVASc: 4 (4% annual stroke risk).   Pseudomonas bacteremia/Neutropenic fever: Improving, fever curve flat, WBC trending upward s/p neupogen.  - Culture showed sensitivity to ciprofloxacin. Transition to cipro po 11/16>>11/25 to complete 2 week course.  T2DM: HbA1c 5.9% earlier this year. Continue holding oral hypoglycemic agents. - SSI  Recent diagnosis of GYN malignancy: Based on +carcinoma cytology from 10/24 paracentesis, elevated CEA 125, s/p 1 cycle  taxol/carboplatin 11/3.  - Per oncology, Dr. Benay Spice. Discontinued G-CSF and decadron.  Drug-induced neutropenia: Resolved  Severe protein calorie malnutrition: - Continue ensure TIDAC  DVT prophylaxis: SCDs Family Communication: None at bedside this AM Disposition Plan/Barrier to D/C: Home in 24-48hrs Code Status: Full code  IV Access:    Peripheral IV  Procedures and diagnostic studies:   Ct Abdomen Pelvis Wo Contrast  Result Date: 06/03/2016 CLINICAL DATA:  Drop in hemoglobin, evaluate for retroperitoneal bleed EXAM: CT ABDOMEN AND PELVIS WITHOUT CONTRAST TECHNIQUE: Multidetector CT imaging of the abdomen and pelvis was performed following the standard protocol without IV contrast. COMPARISON:  05/07/2016 FINDINGS: Lower chest: Moderate left pleural effusion. Small right pleural effusion. Hepatobiliary: Diminutive liver size with moderate perihepatic ascites. No focal gallbladder abnormality. Pancreas: Unremarkable. No pancreatic ductal dilatation or surrounding inflammatory changes. Spleen: Normal in size without focal abnormality. Adrenals/Urinary Tract: Adrenal glands are unremarkable. Kidneys are normal, without renal calculi, focal lesion, or hydronephrosis. Bladder is unremarkable. Stomach/Bowel: Stomach is within normal limits. Appendix appears normal. No evidence of bowel wall thickening, distention, or inflammatory changes. Vascular/Lymphatic: Normal caliber abdominal aorta. Abdominal aortic atherosclerosis. No lymphadenopathy. Reproductive: Soft tissue density seen in the cul-de-sac of indeterminate etiology. Other: Small amount of abdominal and pelvic ascites. Heterogeneous masslike area in the left anterior omentum measuring approximately 4 x 2.5 cm better seen on prior abdominal CT dated 05/07/2016 concerning for malignancy. Musculoskeletal: Right total hip arthroplasty without failure complication. Generalized osteopenia. Degenerative disc disease with disc height loss at  L4-5 and L5-S1. Grade 1 anterolisthesis of L5 on S1 with bilateral facet arthropathy. IMPRESSION: 1. Heterogeneous masslike area  in the left anterior omentum measuring approximately 4 x 2.5 cm better seen on prior abdominal CT dated 05/07/2016 concerning for malignancy. Small amount of abdominal and pelvic ascites. 2. Moderate left and small right pleural effusion. 3.  Aortic Atherosclerosis (ICD10-170.0) Electronically Signed   By: Kathreen Devoid   On: 06/03/2016 11:16     Medical Consultants:    None.  Anti-Infectives:   Zosyn  Subjective:    Yolanda James  Feels better than yesterday.She relates she had melanotic stools this morning.  Objective:    Vitals:   06/03/16 2043 06/03/16 2143 06/04/16 0557 06/04/16 1442  BP: (!) 115/59 104/71 132/82 (!) 129/56  Pulse: (!) 111 77  82  Resp:    20  Temp:  97.9 F (36.6 C) 98.1 F (36.7 C) 98 F (36.7 C)  TempSrc:  Oral  Oral  SpO2:  95% 91% 97%  Weight:      Height:        Intake/Output Summary (Last 24 hours) at 06/04/16 1732 Last data filed at 06/04/16 1400  Gross per 24 hour  Intake             1265 ml  Output              450 ml  Net              815 ml   Filed Weights   05/31/16 1355  Weight: 98.1 kg (216 lb 4.3 oz)    Exam: General exam: Elderly, pleasant woman in no acute distress. Respiratory system: Good air movement and clear to auscultation. Cardiovascular system: S1 & S2 heard, RRR. Gastrointestinal system: Abdomen is nondistended, soft and nontender.  Extremities: No pedal edema. Skin: No rashes, lesions or ulcers Psychiatry: Memory impaired. Mood & affect appropriate.   Data Reviewed:   Labs: Basic Metabolic Panel:  Recent Labs Lab 05/31/16 0846 06/01/16 0049 06/02/16 0334 06/03/16 0321 06/04/16 0319  NA 133* 131* 132* 133* 137  K 4.1 3.7 3.6 3.6 3.1*  CL 97* 97* 103 102 103  CO2 26 25 24 22 25   GLUCOSE 143* 126* 125* 165* 129*  BUN 23* 25* 27* 26* 25*  CREATININE 0.79 0.79 0.91 0.78  0.91  CALCIUM 8.2* 7.8* 7.3* 7.6* 8.0*   GFR Estimated Creatinine Clearance: 55.3 mL/min (by C-G formula based on SCr of 0.91 mg/dL). Liver Function Tests:  Recent Labs Lab 05/31/16 0846  AST 36  ALT 27  ALKPHOS 109  BILITOT 1.2  PROT 5.8*  ALBUMIN 2.7*    Recent Labs Lab 05/31/16 0846  LIPASE 11   No results for input(s): AMMONIA in the last 168 hours. Coagulation profile  Recent Labs Lab 05/31/16 1334 06/01/16 0049 06/01/16 2010 06/03/16 0321 06/04/16 0319  INR >10.00* 2.05 1.92 1.71 1.66    CBC:  Recent Labs Lab 05/31/16 1334 06/01/16 0049 06/01/16 0747 06/01/16 2010 06/02/16 0334 06/03/16 0321 06/04/16 0319  WBC 0.4* 1.0* 1.4* 2.4* 3.4* 9.4 15.5*  NEUTROABS 0.0* 0.3*  --   --  2.3 7.8* 13.5*  HGB 10.6* 9.2* 9.2* 9.2* 9.1* 9.5* 10.3*  HCT 33.1* 28.0* 28.1* 27.7* 28.4* 29.4* 32.1*  MCV 77.5* 75.9* 76.2* 76.7* 78.7 77.6* 77.2*  PLT 122* 86* 79* 87* 89* 82* 91*   Cardiac Enzymes: No results for input(s): CKTOTAL, CKMB, CKMBINDEX, TROPONINI in the last 168 hours. BNP (last 3 results) No results for input(s): PROBNP in the last 8760 hours. CBG:  Recent Labs Lab 06/04/16 0037 06/04/16 0408  06/04/16 0801 06/04/16 1157 06/04/16 1705  GLUCAP 138* 127* 142* 153* 127*   D-Dimer: No results for input(s): DDIMER in the last 72 hours. Hgb A1c: No results for input(s): HGBA1C in the last 72 hours. Lipid Profile: No results for input(s): CHOL, HDL, LDLCALC, TRIG, CHOLHDL, LDLDIRECT in the last 72 hours. Thyroid function studies: No results for input(s): TSH, T4TOTAL, T3FREE, THYROIDAB in the last 72 hours.  Invalid input(s): FREET3 Anemia work up: No results for input(s): VITAMINB12, FOLATE, FERRITIN, TIBC, IRON, RETICCTPCT in the last 72 hours. Sepsis Labs:  Recent Labs Lab 06/01/16 2010 06/02/16 0334 06/03/16 0321 06/04/16 0319  WBC 2.4* 3.4* 9.4 15.5*   Microbiology Recent Results (from the past 240 hour(s))  TECHNOLOGIST REVIEW      Status: None   Collection Time: 05/28/16  9:19 AM  Result Value Ref Range Status   Technologist Review Rare metamyelocyte  Final  Culture, blood (routine x 2)     Status: Abnormal   Collection Time: 05/31/16  1:20 PM  Result Value Ref Range Status   Specimen Description BLOOD RIGHT ANTECUBITAL  Final   Special Requests BOTTLES DRAWN AEROBIC ONLY 6CC  Final   Culture  Setup Time   Final    GRAM NEGATIVE RODS AEROBIC BOTTLE ONLY CRITICAL RESULT CALLED TO, READ BACK BY AND VERIFIED WITH: D WOFFORD,PHARMD AT 1253 06/01/16 BY L BENFIELD    Culture (A)  Final    PSEUDOMONAS AERUGINOSA SUSCEPTIBILITIES PERFORMED ON PREVIOUS CULTURE WITHIN THE LAST 5 DAYS. Performed at [facility]    Report Status 06/03/2016 FINAL  Final  Culture, blood (routine x 2)     Status: Abnormal   Collection Time: 05/31/16  1:32 PM  Result Value Ref Range Status   Specimen Description BLOOD RIGHT ANTECUBITAL  Final   Special Requests BOTTLES DRAWN AEROBIC ONLY 5CC  Final   Culture  Setup Time   Final    GRAM NEGATIVE RODS AEROBIC BOTTLE ONLY CRITICAL RESULT CALLED TO, READ BACK BY AND VERIFIED WITH: D WOFFORD,PHARMD AT 1253 06/01/16 BY L BENFIELD Performed at [place] (A)  Final   Report Status 06/03/2016 FINAL  Final   Organism ID, Bacteria PSEUDOMONAS AERUGINOSA  Final      Susceptibility   Pseudomonas aeruginosa - MIC*    CEFTAZIDIME 4 SENSITIVE Sensitive     CIPROFLOXACIN <=0.25 SENSITIVE Sensitive     GENTAMICIN 2 SENSITIVE Sensitive     IMIPENEM 2 SENSITIVE Sensitive     PIP/TAZO 16 SENSITIVE Sensitive     CEFEPIME 4 SENSITIVE Sensitive     * PSEUDOMONAS AERUGINOSA  Blood Culture ID Panel (Reflexed)     Status: Abnormal   Collection Time: 05/31/16  1:32 PM  Result Value Ref Range Status   Enterococcus species NOT DETECTED NOT DETECTED Final   Listeria monocytogenes NOT DETECTED NOT DETECTED Final   Staphylococcus species NOT DETECTED NOT  DETECTED Final   Staphylococcus aureus NOT DETECTED NOT DETECTED Final   Streptococcus species NOT DETECTED NOT DETECTED Final   Streptococcus agalactiae NOT DETECTED NOT DETECTED Final   Streptococcus pneumoniae NOT DETECTED NOT DETECTED Final   Streptococcus pyogenes NOT DETECTED NOT DETECTED Final   Acinetobacter baumannii NOT DETECTED NOT DETECTED Final   Enterobacteriaceae species NOT DETECTED NOT DETECTED Final   Enterobacter cloacae complex NOT DETECTED NOT DETECTED Final   Escherichia coli NOT DETECTED NOT DETECTED Final   Klebsiella oxytoca NOT DETECTED NOT DETECTED Final   Klebsiella  pneumoniae NOT DETECTED NOT DETECTED Final   Proteus species NOT DETECTED NOT DETECTED Final   Serratia marcescens NOT DETECTED NOT DETECTED Final   Carbapenem resistance NOT DETECTED NOT DETECTED Final   Haemophilus influenzae NOT DETECTED NOT DETECTED Final   Neisseria meningitidis NOT DETECTED NOT DETECTED Final   Pseudomonas aeruginosa DETECTED (A) NOT DETECTED Final    Comment: CRITICAL RESULT CALLED TO, READ BACK BY AND VERIFIED WITH: D WOFFORD,PHARMD AT 1253 06/01/16 BY L BENFIELD    Candida albicans NOT DETECTED NOT DETECTED Final   Candida glabrata NOT DETECTED NOT DETECTED Final   Candida krusei NOT DETECTED NOT DETECTED Final   Candida parapsilosis NOT DETECTED NOT DETECTED Final   Candida tropicalis NOT DETECTED NOT DETECTED Final    Comment: Performed at [facility]  MRSA PCR Screening     Status: None   Collection Time: 05/31/16  2:34 PM  Result Value Ref Range Status   MRSA by PCR NEGATIVE NEGATIVE Final    Comment:        The GeneXpert MRSA Assay (FDA approved for NASAL specimens only), is one component of a comprehensive MRSA colonization surveillance program. It is not intended to diagnose MRSA infection nor to guide or monitor treatment for MRSA infections.   C difficile quick scan w PCR reflex     Status: None   Collection Time: 05/31/16 11:30 PM  Result  Value Ref Range Status   C Diff antigen NEGATIVE NEGATIVE Final   C Diff toxin NEGATIVE NEGATIVE Final   C Diff interpretation No C. difficile detected.  Final  Gastrointestinal Panel by PCR , Stool     Status: None   Collection Time: 05/31/16 11:30 PM  Result Value Ref Range Status   Campylobacter species NOT DETECTED NOT DETECTED Final   Plesimonas shigelloides NOT DETECTED NOT DETECTED Final   Salmonella species NOT DETECTED NOT DETECTED Final   Yersinia enterocolitica NOT DETECTED NOT DETECTED Final   Vibrio species NOT DETECTED NOT DETECTED Final   Vibrio cholerae NOT DETECTED NOT DETECTED Final   Enteroaggregative E coli (EAEC) NOT DETECTED NOT DETECTED Final   Enteropathogenic E coli (EPEC) NOT DETECTED NOT DETECTED Final   Enterotoxigenic E coli (ETEC) NOT DETECTED NOT DETECTED Final   Shiga like toxin producing E coli (STEC) NOT DETECTED NOT DETECTED Final   Shigella/Enteroinvasive E coli (EIEC) NOT DETECTED NOT DETECTED Final   Cryptosporidium NOT DETECTED NOT DETECTED Final   Cyclospora cayetanensis NOT DETECTED NOT DETECTED Final   Entamoeba histolytica NOT DETECTED NOT DETECTED Final   Giardia lamblia NOT DETECTED NOT DETECTED Final   Adenovirus F40/41 NOT DETECTED NOT DETECTED Final   Astrovirus NOT DETECTED NOT DETECTED Final   Norovirus GI/GII NOT DETECTED NOT DETECTED Final   Rotavirus A NOT DETECTED NOT DETECTED Final   Sapovirus (I, II, IV, and V) NOT DETECTED NOT DETECTED Final   Medications:   . cefTAZidime (FORTAZ)  IV  2 g Intravenous Q8H  . diltiazem  240 mg Oral Daily  . feeding supplement (ENSURE ENLIVE)  237 mL Oral BID BM  . insulin aspart  0-9 Units Subcutaneous Q4H  . metoprolol tartrate  25 mg Oral BID  . multivitamin with minerals  1 tablet Oral Daily  . pilocarpine  1 drop Both Eyes QID  . polyethylene glycol  17 g Oral Daily  . QUEtiapine  25 mg Oral QHS   Continuous Infusions: . sodium chloride 50 mL/hr at 06/04/16 0111   Time spent: 25  min   LOS: 4 days   Vance Gather, MD  [facility] Pager [phone]  *Please refer to [email or web address], password [email or web address] to get updated schedule on who will round on this patient, as hospitalists switch teams weekly. If 7PM-7AM, please contact night-coverage at [email or web address], password [email or web address] for any overnight needs. 06/04/2016, 5:32 PM

## 2016-06-05 LAB — CBC WITH DIFFERENTIAL/PLATELET
BASOS ABS: 0 10*3/uL (ref 0.0–0.1)
Basophils Relative: 0 %
Eosinophils Absolute: 0 10*3/uL (ref 0.0–0.7)
Eosinophils Relative: 0 %
HCT: 31.2 % — ABNORMAL LOW (ref 36.0–46.0)
HEMOGLOBIN: 10 g/dL — AB (ref 12.0–15.0)
LYMPHS PCT: 3 %
Lymphs Abs: 0.6 10*3/uL — ABNORMAL LOW (ref 0.7–4.0)
MCH: 24.8 pg — ABNORMAL LOW (ref 26.0–34.0)
MCHC: 32.1 g/dL (ref 30.0–36.0)
MCV: 77.4 fL — ABNORMAL LOW (ref 78.0–100.0)
MONOS PCT: 10 %
Monocytes Absolute: 2 10*3/uL — ABNORMAL HIGH (ref 0.1–1.0)
NEUTROS ABS: 17.5 10*3/uL — AB (ref 1.7–7.7)
NEUTROS PCT: 87 %
PLATELETS: 72 10*3/uL — AB (ref 150–400)
RBC: 4.03 MIL/uL (ref 3.87–5.11)
RDW: 16.8 % — ABNORMAL HIGH (ref 11.5–15.5)
WBC: 20.1 10*3/uL — AB (ref 4.0–10.5)

## 2016-06-05 LAB — BASIC METABOLIC PANEL
ANION GAP: 9 (ref 5–15)
BUN: 26 mg/dL — ABNORMAL HIGH (ref 6–20)
CALCIUM: 7.9 mg/dL — AB (ref 8.9–10.3)
CO2: 21 mmol/L — ABNORMAL LOW (ref 22–32)
CREATININE: 0.73 mg/dL (ref 0.44–1.00)
Chloride: 110 mmol/L (ref 101–111)
Glucose, Bld: 134 mg/dL — ABNORMAL HIGH (ref 65–99)
Potassium: 3.7 mmol/L (ref 3.5–5.1)
Sodium: 140 mmol/L (ref 135–145)

## 2016-06-05 LAB — GLUCOSE, CAPILLARY
GLUCOSE-CAPILLARY: 106 mg/dL — AB (ref 65–99)
GLUCOSE-CAPILLARY: 136 mg/dL — AB (ref 65–99)
Glucose-Capillary: 127 mg/dL — ABNORMAL HIGH (ref 65–99)

## 2016-06-05 LAB — PROTIME-INR
INR: 2.16
PROTHROMBIN TIME: 24.4 s — AB (ref 11.4–15.2)

## 2016-06-05 MED ORDER — LATANOPROST 0.005 % OP SOLN
1.0000 [drp] | Freq: Every day | OPHTHALMIC | Status: DC
  Filled 2016-06-05: qty 2.5

## 2016-06-05 MED ORDER — ALBUTEROL SULFATE (2.5 MG/3ML) 0.083% IN NEBU
5.0000 mg | INHALATION_SOLUTION | Freq: Once | RESPIRATORY_TRACT | Status: AC
  Administered 2016-06-05: 5 mg via RESPIRATORY_TRACT
  Filled 2016-06-05: qty 6

## 2016-06-05 MED ORDER — ALBUTEROL SULFATE (2.5 MG/3ML) 0.083% IN NEBU: 2.5000 mg | INHALATION_SOLUTION | Freq: Four times a day (QID) | RESPIRATORY_TRACT | Status: DC | PRN

## 2016-06-05 MED ORDER — DORZOLAMIDE HCL 2 % OP SOLN
1.0000 [drp] | Freq: Two times a day (BID) | OPHTHALMIC | Status: DC
  Filled 2016-06-05: qty 10

## 2016-06-05 MED ORDER — CIPROFLOXACIN HCL 500 MG PO TABS: 750.0000 mg | ORAL_TABLET | Freq: Two times a day (BID) | ORAL | Status: DC

## 2016-06-05 MED ORDER — METOPROLOL TARTRATE 50 MG PO TABS: 50.0000 mg | ORAL_TABLET | Freq: Two times a day (BID) | ORAL | 0 refills | Status: AC

## 2016-06-05 MED ORDER — CIPROFLOXACIN HCL 750 MG PO TABS: 750.0000 mg | ORAL_TABLET | Freq: Two times a day (BID) | ORAL | 0 refills | Status: DC

## 2016-06-05 NOTE — Clinical Social Work Placement (Signed)
Medical Social Worker facilitated patient discharge including contacting patient family and facility to confirm patient discharge plans.  Clinical information faxed to facility and family agreeable with plan.  MSW arranged ambulance transport via [place] to [facility] and [place].  RN to call report prior to discharge [phone].  Medical Social Worker will sign off for now as social work intervention is no longer needed. Please consult [place] again if new need arises.  CLINICAL SOCIAL WORK PLACEMENT  NOTE  Date:  06/05/2016  Patient Details  Name: NYKESHA SWALES MRN: [id number] Date of Birth: Jan 24, 1931  Clinical Social Work is seeking post-discharge placement for this patient at the [place] level of care (*CSW will initial, date and re-position this form in  chart as items are completed):  Yes   Patient/family provided with [place] Work Department's list of facilities offering this level of care within the geographic area requested by the patient (or if unable, by the patient's family).  Yes   Patient/family informed of their freedom to choose among providers that offer the needed level of care, that participate in Medicare, Medicaid or managed care program needed by the patient, have an available bed and are willing to accept the patient.  Yes   Patient/family informed of [place]'s ownership interest in [facility] and [facility], as well as of the fact that they are under no obligation to receive care at these facilities.  PASRR submitted to EDS on 06/03/16     PASRR number received on       Existing PASRR number confirmed on 06/03/16     FL2 transmitted to all facilities in geographic area requested by pt/family on 06/03/16     FL2 transmitted to all facilities within larger geographic area on       Patient informed that his/her managed care company has contracts with or will negotiate with certain facilities, including the  following:        Yes   Patient/family informed of bed offers received.  Patient chooses bed at  [facility] and [place] )     Physician recommends and patient chooses bed at      Patient to be transferred to  [facility] [place]) on 06/05/16.  Patient to be transferred to facility by  [facility] )     Patient family notified on 06/05/16 of transfer.  Name of family member notified:   (Pt's son, Maudie Mercury and dtr, Cher[place] )     PHYSICIAN Please sign FL2, Please prepare priority discharge summary, including medications     Additional Comment:    _______________________________________________ Glendon Axe A 06/05/2016, 12:54 PM

## 2016-06-05 NOTE — Progress Notes (Signed)
Report called to [facility], acknowledge understanding. SRP, RN

## 2016-06-05 NOTE — Discharge Summary (Addendum)
Physician Discharge Summary  GUSTINE FANCY [id number] DOB: 1931-04-16 DOA: 05/31/2016  PCP: [name], MD  Admit date: 05/31/2016 Discharge date: 06/05/2016  Admitted From: Home Disposition: [facility]   Recommendations for Outpatient Follow-up:  1. Follow up with PCP in 1-2 weeks 2. Follow up with oncology in 1 week, plan is to resume reduced intensity chemotherapy. 3. Follow up with [facility], Dr. Watt Climes, as needed 4. Continue ciprofloxacin to complete a total of 10 days of therapy, last dose 11/20. 5. Coumadin on hold, will need frequent INR checks. Cipro likely to interact. 6. Consider starting nighttime seroquel (not prescribed at discharge) for hallucinations/delirium. 7. Monitor heart rate, metoprolol increased to 50mg  twice daily for AFib with RVR in the setting of bacteremia. 8. Please obtain BMP/CBC to monitor renal function and microcytic anemia  Home Health: Per SNF Equipment/Devices: Per SNF  Discharge Condition: Stable CODE STATUS: Full Diet recommendation: Heart healthy  Brief/Interim Summary: Yolanda James is an 82 y.o. female Past medical history of ovarian CA on chemotherapy, A. Fib on chronic coumadin who presented on 11/11 with BRBPR and melena. Hgb was 9 from previous of 13, INR > 10. In the ED she received IV vitamin K and FFP. GI, Dr. Watt Climes, evluated patient and recommended no interventions as hemoglobin trended upward after INR correction. He will be available as needed as an outpatient.  She also has been having fevers last chemotherapy about a week ago she was leukopenic with thrombocytopenia. Blood cultures from admission grew positive for pseudomonas aeruginosa, she was started coverage with zosyn and cipro on 11.12.2017, de-escalated to [place] on 11.13.2017, and changed to oral ciprofloxacin on 11/16, to complete a 10-day course. Neutropenia resolved s/p G-CSF per oncology.   On admission she was found on A.fib with RVR initially requiring  diltiazem gtt, which has been changed to her home medications. Heart rate still increasing with activity, so metoprolol was increased with improvement without hypotension. Due to risk of CVA with AFib, it is thought she should continue anticoagulation eventually. Her INR continues to trend upward and is therapeutic at discharge, thus coumadin was not dosed but can be continued. Consideration was given to eliquis, though this is not reversible and there is concern for over-anticoagulation with her auto-anticoagultion.   Discharge Diagnoses:  Active Problems:   Bloody diarrhea   Gastrointestinal bleed   Drug-induced neutropenia (HCC)   Pancytopenia, acquired ([place])   Sepsis ([place])   Bacteremia due to Pseudomonas  Acute lower GI bleed/acute blood loss anemia: Resolved. In setting of supratherapeutic INR which has resolved s/p FFP and Vit K. Hemoglobin stable since transfusions indicating no active GI bleed.  - GI plans no interventions, restart diet, advance as tolerated - Have discussed with GI and oncology Re: resuming anticoagulation. Could consider eliquis, but will hold this and coumadin for now with rising INR.  Thrombocytopenia: Likely due to chemotherapy - Transfuse for < 50k  - Continue to trend.  A. fib with RVR: Off diltiazem drip on home medications and HR creeping back up > 120's.  - Increase home metoprolol to 50mg  BID (from 25mg  BID) - Metoprolol IV prn HR > 110 bpm.  - CHADSVASc: 4 (4% annual stroke risk).   Pseudomonas bacteremia/Neutropenic fever: Improving, fever curve flat, WBC trending upward s/p neupogen.  - Culture showed sensitivity to ciprofloxacin.  - IV fluids were given in setting of bacteremia, and lasix was held. She has maintained hydration by mouth and IV fluids were stopped. On discharge, exam  shows no crackles, but increasing LE edema. Home lasix will be restarted at discharge.   T2DM: HbA1c 5.9% earlier this year.  - Consider holding oral hypoglycemic  agents.  Recent diagnosis of GYN malignancy: Based on +carcinoma cytology from 10/24 paracentesis, elevated CEA 125, s/p 1 cycle taxol/carboplatin 11/3.  - Per oncology, Dr. Benay Spice. Discontinued G-CSF and decadron.  Drug-induced neutropenia: Resolved  Severe protein calorie malnutrition: - Continue ensure [facility]  Discharge Instructions Discharge Instructions    Diet - low sodium heart healthy    Complete by:  As directed    Discharge instructions    Complete by:  As directed    SEE DISCHARGE SUMMARY:  - Continue ciprofloxacin to complete a total of 2 weeks of therapy, last dose 11/25. - Coumadin on hold, will need frequent INR checks. - Consider starting nighttime seroquel (not prescribed at discharge) for hallucinations/delirium. - Monitor heart rate, metoprolol increased to 50mg  twice daily for AFib with RVR in the setting of bacteremia.   Increase activity slowly    Complete by:  As directed        Medication List    STOP taking these medications   dexamethasone 4 MG tablet Commonly known as:  DECADRON   warfarin 5 MG tablet Commonly known as:  COUMADIN     TAKE these medications   atorvastatin 80 MG tablet Commonly known as:  LIPITOR TAKE 1 TABLET(80 MG) BY MOUTH DAILY   ciprofloxacin 750 MG tablet Commonly known as:  CIPRO Take 1 tablet (750 mg total) by mouth 2 (two) times daily.   Co Q 10 100 MG Caps Take 1 capsule by mouth daily.   diltiazem 240 MG 24 hr capsule Commonly known as:  CARDIZEM CD Take 1 capsule (240 mg total) by mouth daily.   dorzolamide 2 % ophthalmic solution Commonly known as:  TRUSOPT Place 1 drop into both eyes 2 (two) times daily.   Fish Oil 1000 MG Caps Take 1,000 mg by mouth 2 (two) times daily.   freestyle lancets every 3 (three) days. Reported on 09/11/2015   FREESTYLE LITE test strip Generic drug:  glucose blood every 3 (three) days. Reported on 09/11/2015   furosemide 40 MG tablet Commonly known as:  LASIX Take 1  tablet daily. May take additional dose once weekly for swelling.   glipiZIDE 2.5 MG 24 hr tablet Commonly known as:  GLUCOTROL XL Take 2.5 mg by mouth 2 (two) times daily.   LUMIGAN 0.01 % Soln Generic drug:  bimatoprost Place 1 drop into both eyes at bedtime.   metFORMIN 500 MG tablet Commonly known as:  GLUCOPHAGE Take 500 mg by mouth 2 (two) times daily with a meal.   metoprolol 50 MG tablet Commonly known as:  LOPRESSOR Take 1 tablet (50 mg total) by mouth 2 (two) times daily. What changed:  medication strength  how much to take   ondansetron 8 MG tablet Commonly known as:  ZOFRAN Take 1 tablet (8 mg total) by mouth every 8 (eight) hours as needed for nausea or vomiting.   pilocarpine 4 % ophthalmic solution Commonly known as:  PILOCAR Place 1 drop into both eyes 4 (four) times daily.   polyethylene glycol packet Commonly known as:  MIRALAX / GLYCOLAX Take 17 g by mouth daily as needed.   potassium chloride 10 MEQ CR capsule Commonly known as:  MICRO-K Take 10 mEq by mouth daily.   timolol 0.5 % ophthalmic solution Commonly known as:  TIMOPTIC Place 1 drop into  both eyes 2 (two) times daily.      Follow-up Information    [facility] Follow up.   Specialty:  Radiology Contact information: [address] [id number] [place] [facility] D, MD Follow up.   Specialty:  Internal Medicine Contact information: [address] [place] [address] [phone]        Betsy Coder, MD Follow up.   Specialty:  Oncology Contact information: [place] [address] [phone]        [facility] E, MD Follow up.   Specialty:  Gastroenterology Why:  as needed Contact information: [address] [place] [address] [phone]          No Known Allergies  Consultations:  Oncology, Dr.  Benay Spice  Gastroenterology, Dr. Watt Climes  Procedures/Studies: Ct Abdomen Pelvis Wo Contrast  Result Date: 06/03/2016 CLINICAL DATA:  Drop in hemoglobin, evaluate for retroperitoneal bleed EXAM: CT ABDOMEN AND PELVIS WITHOUT CONTRAST TECHNIQUE: Multidetector CT imaging of the abdomen and pelvis was performed following the standard protocol without IV contrast. COMPARISON:  05/07/2016 FINDINGS: Lower chest: Moderate left pleural effusion. Small right pleural effusion. Hepatobiliary: Diminutive liver size with moderate perihepatic ascites. No focal gallbladder abnormality. Pancreas: Unremarkable. No pancreatic ductal dilatation or surrounding inflammatory changes. Spleen: Normal in size without focal abnormality. Adrenals/Urinary Tract: Adrenal glands are unremarkable. Kidneys are normal, without renal calculi, focal lesion, or hydronephrosis. Bladder is unremarkable. Stomach/Bowel: Stomach is within normal limits. Appendix appears normal. No evidence of bowel wall thickening, distention, or inflammatory changes. Vascular/Lymphatic: Normal caliber abdominal aorta. Abdominal aortic atherosclerosis. No lymphadenopathy. Reproductive: Soft tissue density seen in the cul-de-sac of indeterminate etiology. Other: Small amount of abdominal and pelvic ascites. Heterogeneous masslike area in the left anterior omentum measuring approximately 4 x 2.5 cm better seen on prior abdominal CT dated 05/07/2016 concerning for malignancy. Musculoskeletal: Right total hip arthroplasty without failure complication. Generalized osteopenia. Degenerative disc disease with disc height loss at L4-5 and L5-S1. Grade 1 anterolisthesis of L5 on S1 with bilateral facet arthropathy. IMPRESSION: 1. Heterogeneous masslike area in the left anterior omentum measuring approximately 4 x 2.5 cm better seen on prior abdominal CT dated 05/07/2016 concerning for malignancy. Small amount of abdominal and pelvic ascites. 2. Moderate left and small right  pleural effusion. 3.  Aortic Atherosclerosis (ICD10-170.0) Electronically Signed   By: Kathreen Devoid   On: 06/03/2016 11:16   Dg Chest 2 View  Result Date: 05/31/2016 CLINICAL DATA:  Pt states she is "wore out". Recent chemo treatment for ovarian cancer. Denies any chest complaints. H/o CHF, Chronic a-fib, and type 2 diabetes. Former smoker. EXAM: CHEST  2 VIEW COMPARISON:  08/27/2015 FINDINGS: Small left pleural effusion. No focal consolidation. No pneumothorax. Stable cardiomediastinal silhouette. No acute osseous abnormality. IMPRESSION: Small left pleural effusion. Electronically Signed   By: Kathreen Devoid   On: 05/31/2016 13:19   US Abdomen Complete  Result Date: 06/01/2016 CLINICAL DATA:  Patient with diffuse abdominal pain for 3 days. EXAM: ABDOMEN ULTRASOUND COMPLETE COMPARISON:  CT abdomen pelvis 05/07/2016. FINDINGS: Gallbladder: No gallstones or wall thickening visualized. No sonographic Murphy sign noted by sonographer. Common bile duct: Diameter: 5 mm Liver: No focal lesion identified. Within normal limits in parenchymal echogenicity. IVC: No abnormality visualized. Pancreas: Visualized portion unremarkable. Spleen: Size and appearance within normal limits. Right Kidney: Length: 10.6 cm. Echogenicity within normal limits. No mass or hydronephrosis visualized. Left Kidney:  Length: 8.4 cm. Echogenicity within normal limits. No mass or hydronephrosis visualized. Abdominal aorta: No aneurysm visualized. Other findings: Small amount of ascites. IMPRESSION: Small amount of ascites. No acute process within the abdomen. Electronically Signed   By: Lovey Newcomer M.D.   On: 06/01/2016 13:36   Ct Abdomen Pelvis W Contrast  Result Date: 05/07/2016 CLINICAL DATA:  Abdominal fullness, bloating for 4 weeks EXAM: CT ABDOMEN AND PELVIS WITH CONTRAST TECHNIQUE: Multidetector CT imaging of the abdomen and pelvis was performed using the standard protocol following bolus administration of intravenous contrast.  CONTRAST:  152mL ISOVUE-300 IOPAMIDOL (ISOVUE-300) INJECTION 61% COMPARISON:  None. FINDINGS: Lower chest: There is small left pleural effusion. Atherosclerotic calcifications of coronary arteries. Hepatobiliary: There is perihepatic and perisplenic ascites. No focal hepatic mass. No calcified gallstones are noted within gallbladder. Small hiatal hernia. Pancreas: Enhanced pancreas is unremarkable. Spleen: Enhanced spleen is unremarkable. Adrenals/Urinary Tract: No adrenal gland mass. Enhanced kidneys are symmetrical in size. No hydronephrosis or hydroureter. Delayed renal images shows bilateral renal symmetrical excretion. Bilateral visualized proximal ureter is unremarkable. The urinary bladder is under distended limiting its assessment. Stomach/Bowel: There is no small bowel obstruction. No gastric outlet obstruction. No thickened or dilated small bowel loops. Terminal ileum is unremarkable. No pericecal inflammation. Normal appendix is partially visualized in axial image 58. No distal colonic obstruction. Moderate stool noted within rectum. Mild fecal impaction cannot be excluded. The rectum measures 6.4 cm in diameter. Vascular/Lymphatic: No aortic aneurysm. Atherosclerotic calcifications of abdominal aorta and iliac arteries. Reproductive: The the uterus is retroflexed. There is indistinctness of the posterior wall of the uterus with heterogeneous attenuation. Neoplastic process cannot be excluded. Correlation with GYN exam and MRI is recommended. There is moderate ascites bilateral paracolic gutter. There is abnormal soft tissue density in anterior mesentery with nodular appearance highly suspicious for omental caking. Metastatic disease or primary peritoneal disease cannot be excluded. Soft tissue deposits are noted right anterior subdiaphragmatic region axial image 17 the largest measures 1 cm. There are soft tissue deposits in posterior cul-de-sac suspicious for metastatic seeding the largest measures 2.3  cm. Other: No free abdominal air. There are metallic artifacts from right hip prosthesis. Moderate pelvic ascites. Musculoskeletal: Osteopenia and degenerative changes are noted thoracolumbar spine. Significant disc space flattening with vacuum disc phenomenon L4-L5 and L5-S1 level. There is about 5 mm anterolisthesis L5 on S1 vertebral body. IMPRESSION: 1. There is indistinctness of the posterior wall of the uterus with heterogeneous attenuation. Neoplastic process cannot be excluded. Correlation with GYN exam and MRI is recommended. There is moderate ascites bilateral paracolic gutter. There is abnormal soft tissue density in anterior mesentery with nodular appearance highly suspicious for omental caking. Metastatic disease or primary peritoneal disease cannot be excluded. Soft tissue deposits are noted right anterior subdiaphragmatic region axial image 17 the largest measures 1 cm. There are soft tissue deposits in posterior cul-de-sac suspicious for metastatic seeding the largest measures 2.3 cm. Correlation with fluid analysis and biopsy is recommended to exclude peritoneal malignancy. 2. Degenerative changes thoracolumbar spine. 3. No hydronephrosis or hydroureter. 4. Metallic artifacts from right hip prosthesis. 5. Atherosclerotic calcifications of abdominal aorta iliac arteries and coronary arteries. These results were called by telephone at the time of interpretation on 05/07/2016 at 12:10 pm to Dr. Jori Moll POLITE , who verbally acknowledged these results. Electronically Signed   By: Lahoma Crocker M.D.   On: 05/07/2016 12:11   US Paracentesis  Result Date: 05/28/2016 INDICATION: Patient with history of GYN malignancy, probable ovarian  origin, recurrent ascites. Request made for therapeutic paracentesis. EXAM: ULTRASOUND GUIDED THERAPEUTIC PARACENTESIS MEDICATIONS: None. COMPLICATIONS: None immediate. PROCEDURE: Informed written consent was obtained from the patient after a discussion of the risks, benefits  and alternatives to treatment. A timeout was performed prior to the initiation of the procedure. Initial ultrasound scanning demonstrates a small amount of ascites within the right lower abdominal quadrant. The right lower abdomen was prepped and draped in the usual sterile fashion. 1% lidocaine was used for local anesthesia. Following this, a Yueh catheter was introduced. An ultrasound image was saved for documentation purposes. The paracentesis was performed. The catheter was removed and a dressing was applied. The patient tolerated the procedure well without immediate post procedural complication. FINDINGS: A total of approximately 1.5 liters of slightly hazy, amber fluid was removed. IMPRESSION: Successful ultrasound-guided therapeutic paracentesis yielding 1.5 liters of peritoneal fluid. Read by: Rowe Robert, PA-C Electronically Signed   By: Jerilynn Mages.  Shick M.D.   On: 05/28/2016 12:36   US Paracentesis  Result Date: 05/13/2016 INDICATION: Abdominal distention. Ascites. Newly found omental caking concerning for malignant process. Request diagnostic and therapeutic paracentesis. EXAM: ULTRASOUND GUIDED LEFT LOWER QUADRANT PARACENTESIS MEDICATIONS: None. COMPLICATIONS: None immediate. PROCEDURE: Informed written consent was obtained from the patient after a discussion of the risks, benefits and alternatives to treatment. A timeout was performed prior to the initiation of the procedure. Initial ultrasound scanning demonstrates a large amount of ascites within the left lower abdominal quadrant. The left lower abdomen was prepped and draped in the usual sterile fashion. 1% lidocaine with epinephrine was used for local anesthesia. Following this, a Safe-T-Centesis catheter was introduced. An ultrasound image was saved for documentation purposes. The paracentesis was performed. The catheter was removed and a dressing was applied. The patient tolerated the procedure well without immediate post procedural complication.  FINDINGS: A total of approximately 2.1 L of clear yellow fluid was removed. Samples were sent to the laboratory as requested by the clinical team. IMPRESSION: Successful ultrasound-guided paracentesis yielding 2.1 liters of peritoneal fluid. Read by: Ascencion Dike PA-C Electronically Signed   By: Aletta Edouard M.D.   On: 05/13/2016 14:50    Subjective: Patient feeling much better this morning. Family at bedside reports she is at mental baseline. Got up with PT this AM and resting well afterward. Ready to go.   Discharge Exam: Vitals:   06/04/16 2122 06/05/16 0351  BP: 122/73 120/75  Pulse: 73 91  Resp: 20 15  Temp: 97.4 F (36.3 C) 97.3 F (36.3 C)   Vitals:   06/04/16 1442 06/04/16 2122 06/05/16 0054 06/05/16 0351  BP: (!) 129/56 122/73  120/75  Pulse: 82 73  91  Resp: 20 20  15   Temp: 98 F (36.7 C) 97.4 F (36.3 C)  97.3 F (36.3 C)  TempSrc: Oral Oral  Axillary  SpO2: 97% 98% 98% 97%  Weight:      Height:       General: Pt is alert, awake, not in acute distress Cardiovascular: RRR, S1/S2 +, no rubs, no gallops Respiratory: Nonlabored, CTA bilaterally, no wheezing, no rhonchi Abdominal: Soft, NT, ND, bowel sounds + Extremities: Trace LE pitting edema, no cyanosis  The results of significant diagnostics from this hospitalization (including imaging, microbiology, ancillary and laboratory) are listed below for reference.    Microbiology: Recent Results (from the past 240 hour(s))  TECHNOLOGIST REVIEW     Status: None   Collection Time: 05/28/16  9:19 AM  Result Value Ref Range Status  Technologist Review Rare metamyelocyte  Final  Culture, blood (routine x 2)     Status: Abnormal   Collection Time: 05/31/16  1:20 PM  Result Value Ref Range Status   Specimen Description BLOOD RIGHT ANTECUBITAL  Final   Special Requests BOTTLES DRAWN AEROBIC ONLY 6CC  Final   Culture  Setup Time   Final    GRAM NEGATIVE RODS AEROBIC BOTTLE ONLY CRITICAL RESULT CALLED TO, READ BACK  BY AND VERIFIED WITH: D WOFFORD,PHARMD AT 1253 06/01/16 BY L BENFIELD    Culture (A)  Final    PSEUDOMONAS AERUGINOSA SUSCEPTIBILITIES PERFORMED ON PREVIOUS CULTURE WITHIN THE LAST 5 DAYS. Performed at [facility]    Report Status 06/03/2016 FINAL  Final  Culture, blood (routine x 2)     Status: Abnormal   Collection Time: 05/31/16  1:32 PM  Result Value Ref Range Status   Specimen Description BLOOD RIGHT ANTECUBITAL  Final   Special Requests BOTTLES DRAWN AEROBIC ONLY 5CC  Final   Culture  Setup Time   Final    GRAM NEGATIVE RODS AEROBIC BOTTLE ONLY CRITICAL RESULT CALLED TO, READ BACK BY AND VERIFIED WITH: D WOFFORD,PHARMD AT 1253 06/01/16 BY L BENFIELD Performed at [facility] (A)  Final   Report Status 06/03/2016 FINAL  Final   Organism ID, Bacteria PSEUDOMONAS AERUGINOSA  Final      Susceptibility   Pseudomonas aeruginosa - MIC*    CEFTAZIDIME 4 SENSITIVE Sensitive     CIPROFLOXACIN <=0.25 SENSITIVE Sensitive     GENTAMICIN 2 SENSITIVE Sensitive     IMIPENEM 2 SENSITIVE Sensitive     PIP/TAZO 16 SENSITIVE Sensitive     CEFEPIME 4 SENSITIVE Sensitive     * PSEUDOMONAS AERUGINOSA  Blood Culture ID Panel (Reflexed)     Status: Abnormal   Collection Time: 05/31/16  1:32 PM  Result Value Ref Range Status   Enterococcus species NOT DETECTED NOT DETECTED Final   Listeria monocytogenes NOT DETECTED NOT DETECTED Final   Staphylococcus species NOT DETECTED NOT DETECTED Final   Staphylococcus aureus NOT DETECTED NOT DETECTED Final   Streptococcus species NOT DETECTED NOT DETECTED Final   Streptococcus agalactiae NOT DETECTED NOT DETECTED Final   Streptococcus pneumoniae NOT DETECTED NOT DETECTED Final   Streptococcus pyogenes NOT DETECTED NOT DETECTED Final   Acinetobacter baumannii NOT DETECTED NOT DETECTED Final   Enterobacteriaceae species NOT DETECTED NOT DETECTED Final   Enterobacter cloacae complex NOT DETECTED NOT DETECTED  Final   Escherichia coli NOT DETECTED NOT DETECTED Final   Klebsiella oxytoca NOT DETECTED NOT DETECTED Final   Klebsiella pneumoniae NOT DETECTED NOT DETECTED Final   Proteus species NOT DETECTED NOT DETECTED Final   Serratia marcescens NOT DETECTED NOT DETECTED Final   Carbapenem resistance NOT DETECTED NOT DETECTED Final   Haemophilus influenzae NOT DETECTED NOT DETECTED Final   Neisseria meningitidis NOT DETECTED NOT DETECTED Final   Pseudomonas aeruginosa DETECTED (A) NOT DETECTED Final    Comment: CRITICAL RESULT CALLED TO, READ BACK BY AND VERIFIED WITH: D WOFFORD,PHARMD AT 1253 06/01/16 BY L BENFIELD    Candida albicans NOT DETECTED NOT DETECTED Final   Candida glabrata NOT DETECTED NOT DETECTED Final   Candida krusei NOT DETECTED NOT DETECTED Final   Candida parapsilosis NOT DETECTED NOT DETECTED Final   Candida tropicalis NOT DETECTED NOT DETECTED Final    Comment: Performed at [facility]  MRSA PCR Screening     Status: None  Collection Time: 05/31/16  2:34 PM  Result Value Ref Range Status   MRSA by PCR NEGATIVE NEGATIVE Final    Comment:        The GeneXpert MRSA Assay (FDA approved for NASAL specimens only), is one component of a comprehensive MRSA colonization surveillance program. It is not intended to diagnose MRSA infection nor to guide or monitor treatment for MRSA infections.   C difficile quick scan w PCR reflex     Status: None   Collection Time: 05/31/16 11:30 PM  Result Value Ref Range Status   C Diff antigen NEGATIVE NEGATIVE Final   C Diff toxin NEGATIVE NEGATIVE Final   C Diff interpretation No C. difficile detected.  Final  Gastrointestinal Panel by PCR , Stool     Status: None   Collection Time: 05/31/16 11:30 PM  Result Value Ref Range Status   Campylobacter species NOT DETECTED NOT DETECTED Final   Plesimonas shigelloides NOT DETECTED NOT DETECTED Final   Salmonella species NOT DETECTED NOT DETECTED Final   Yersinia enterocolitica  NOT DETECTED NOT DETECTED Final   Vibrio species NOT DETECTED NOT DETECTED Final   Vibrio cholerae NOT DETECTED NOT DETECTED Final   Enteroaggregative E coli (EAEC) NOT DETECTED NOT DETECTED Final   Enteropathogenic E coli (EPEC) NOT DETECTED NOT DETECTED Final   Enterotoxigenic E coli (ETEC) NOT DETECTED NOT DETECTED Final   Shiga like toxin producing E coli (STEC) NOT DETECTED NOT DETECTED Final   Shigella/Enteroinvasive E coli (EIEC) NOT DETECTED NOT DETECTED Final   Cryptosporidium NOT DETECTED NOT DETECTED Final   Cyclospora cayetanensis NOT DETECTED NOT DETECTED Final   Entamoeba histolytica NOT DETECTED NOT DETECTED Final   Giardia lamblia NOT DETECTED NOT DETECTED Final   Adenovirus F40/41 NOT DETECTED NOT DETECTED Final   Astrovirus NOT DETECTED NOT DETECTED Final   Norovirus GI/GII NOT DETECTED NOT DETECTED Final   Rotavirus A NOT DETECTED NOT DETECTED Final   Sapovirus (I, II, IV, and V) NOT DETECTED NOT DETECTED Final     Labs: BNP (last 3 results)  Recent Labs  11/26/15 1450  BNP 0000000   Basic Metabolic Panel:  Recent Labs Lab 06/01/16 0049 06/02/16 0334 06/03/16 0321 06/04/16 0319 06/05/16 0327  NA 131* 132* 133* 137 140  K 3.7 3.6 3.6 3.1* 3.7  CL 97* 103 102 103 110  CO2 25 24 22 25  21*  GLUCOSE 126* 125* 165* 129* 134*  BUN 25* 27* 26* 25* 26*  CREATININE 0.79 0.91 0.78 0.91 0.73  CALCIUM 7.8* 7.3* 7.6* 8.0* 7.9*   Liver Function Tests:  Recent Labs Lab 05/31/16 0846  AST 36  ALT 27  ALKPHOS 109  BILITOT 1.2  PROT 5.8*  ALBUMIN 2.7*    Recent Labs Lab 05/31/16 0846  LIPASE 11   No results for input(s): AMMONIA in the last 168 hours. CBC:  Recent Labs Lab 06/01/16 0049  06/01/16 2010 06/02/16 0334 06/03/16 0321 06/04/16 0319 06/05/16 0327  WBC 1.0*  < > 2.4* 3.4* 9.4 15.5* 20.1*  NEUTROABS 0.3*  --   --  2.3 7.8* 13.5* 17.5*  HGB 9.2*  < > 9.2* 9.1* 9.5* 10.3* 10.0*  HCT 28.0*  < > 27.7* 28.4* 29.4* 32.1* 31.2*  MCV 75.9*  <  > 76.7* 78.7 77.6* 77.2* 77.4*  PLT 86*  < > 87* 89* 82* 91* 72*  < > = values in this interval not displayed. Cardiac Enzymes: No results for input(s): CKTOTAL, CKMB, CKMBINDEX, TROPONINI in the last  168 hours. BNP: Invalid input(s): POCBNP CBG:  Recent Labs Lab 06/04/16 1942 06/04/16 2344 06/05/16 0350 06/05/16 0745 06/05/16 1159  GLUCAP 147* 147* 127* 106* 136*   D-Dimer No results for input(s): DDIMER in the last 72 hours. Hgb A1c No results for input(s): HGBA1C in the last 72 hours. Lipid Profile No results for input(s): CHOL, HDL, LDLCALC, TRIG, CHOLHDL, LDLDIRECT in the last 72 hours. Thyroid function studies No results for input(s): TSH, T4TOTAL, T3FREE, THYROIDAB in the last 72 hours.  Invalid input(s): FREET3 Anemia work up No results for input(s): VITAMINB12, FOLATE, FERRITIN, TIBC, IRON, RETICCTPCT in the last 72 hours. Urinalysis    Component Value Date/Time   COLORURINE AMBER (A) 05/31/2016 1020   APPEARANCEUR CLOUDY (A) 05/31/2016 1020   LABSPEC 1.028 05/31/2016 1020   PHURINE 6.0 05/31/2016 1020   GLUCOSEU NEGATIVE 05/31/2016 1020   HGBUR NEGATIVE 05/31/2016 1020   BILIRUBINUR SMALL (A) 05/31/2016 1020   KETONESUR NEGATIVE 05/31/2016 1020   PROTEINUR 100 (A) 05/31/2016 1020   NITRITE NEGATIVE 05/31/2016 1020   LEUKOCYTESUR NEGATIVE 05/31/2016 1020   Sepsis Labs Invalid input(s): PROCALCITONIN,  WBC,  LACTICIDVEN Microbiology Recent Results (from the past 240 hour(s))  TECHNOLOGIST REVIEW     Status: None   Collection Time: 05/28/16  9:19 AM  Result Value Ref Range Status   Technologist Review Rare metamyelocyte  Final  Culture, blood (routine x 2)     Status: Abnormal   Collection Time: 05/31/16  1:20 PM  Result Value Ref Range Status   Specimen Description BLOOD RIGHT ANTECUBITAL  Final   Special Requests BOTTLES DRAWN AEROBIC ONLY 6CC  Final   Culture  Setup Time   Final    GRAM NEGATIVE RODS AEROBIC BOTTLE ONLY CRITICAL RESULT CALLED TO,  READ BACK BY AND VERIFIED WITH: D WOFFORD,PHARMD AT 1253 06/01/16 BY L BENFIELD    Culture (A)  Final    PSEUDOMONAS AERUGINOSA SUSCEPTIBILITIES PERFORMED ON PREVIOUS CULTURE WITHIN THE LAST 5 DAYS. Performed at [facility]    Report Status 06/03/2016 FINAL  Final  Culture, blood (routine x 2)     Status: Abnormal   Collection Time: 05/31/16  1:32 PM  Result Value Ref Range Status   Specimen Description BLOOD RIGHT ANTECUBITAL  Final   Special Requests BOTTLES DRAWN AEROBIC ONLY 5CC  Final   Culture  Setup Time   Final    GRAM NEGATIVE RODS AEROBIC BOTTLE ONLY CRITICAL RESULT CALLED TO, READ BACK BY AND VERIFIED WITH: D WOFFORD,PHARMD AT 1253 06/01/16 BY L BENFIELD Performed at [place] (A)  Final   Report Status 06/03/2016 FINAL  Final   Organism ID, Bacteria PSEUDOMONAS AERUGINOSA  Final      Susceptibility   Pseudomonas aeruginosa - MIC*    CEFTAZIDIME 4 SENSITIVE Sensitive     CIPROFLOXACIN <=0.25 SENSITIVE Sensitive     GENTAMICIN 2 SENSITIVE Sensitive     IMIPENEM 2 SENSITIVE Sensitive     PIP/TAZO 16 SENSITIVE Sensitive     CEFEPIME 4 SENSITIVE Sensitive     * PSEUDOMONAS AERUGINOSA  Blood Culture ID Panel (Reflexed)     Status: Abnormal   Collection Time: 05/31/16  1:32 PM  Result Value Ref Range Status   Enterococcus species NOT DETECTED NOT DETECTED Final   Listeria monocytogenes NOT DETECTED NOT DETECTED Final   Staphylococcus species NOT DETECTED NOT DETECTED Final   Staphylococcus aureus NOT DETECTED NOT DETECTED Final   Streptococcus species NOT  DETECTED NOT DETECTED Final   Streptococcus agalactiae NOT DETECTED NOT DETECTED Final   Streptococcus pneumoniae NOT DETECTED NOT DETECTED Final   Streptococcus pyogenes NOT DETECTED NOT DETECTED Final   Acinetobacter baumannii NOT DETECTED NOT DETECTED Final   Enterobacteriaceae species NOT DETECTED NOT DETECTED Final   Enterobacter cloacae complex NOT DETECTED  NOT DETECTED Final   Escherichia coli NOT DETECTED NOT DETECTED Final   Klebsiella oxytoca NOT DETECTED NOT DETECTED Final   Klebsiella pneumoniae NOT DETECTED NOT DETECTED Final   Proteus species NOT DETECTED NOT DETECTED Final   Serratia marcescens NOT DETECTED NOT DETECTED Final   Carbapenem resistance NOT DETECTED NOT DETECTED Final   Haemophilus influenzae NOT DETECTED NOT DETECTED Final   Neisseria meningitidis NOT DETECTED NOT DETECTED Final   Pseudomonas aeruginosa DETECTED (A) NOT DETECTED Final    Comment: CRITICAL RESULT CALLED TO, READ BACK BY AND VERIFIED WITH: D WOFFORD,PHARMD AT 1253 06/01/16 BY L BENFIELD    Candida albicans NOT DETECTED NOT DETECTED Final   Candida glabrata NOT DETECTED NOT DETECTED Final   Candida krusei NOT DETECTED NOT DETECTED Final   Candida parapsilosis NOT DETECTED NOT DETECTED Final   Candida tropicalis NOT DETECTED NOT DETECTED Final    Comment: Performed at [facility]  MRSA PCR Screening     Status: None   Collection Time: 05/31/16  2:34 PM  Result Value Ref Range Status   MRSA by PCR NEGATIVE NEGATIVE Final    Comment:        The GeneXpert MRSA Assay (FDA approved for NASAL specimens only), is one component of a comprehensive MRSA colonization surveillance program. It is not intended to diagnose MRSA infection nor to guide or monitor treatment for MRSA infections.   C difficile quick scan w PCR reflex     Status: None   Collection Time: 05/31/16 11:30 PM  Result Value Ref Range Status   C Diff antigen NEGATIVE NEGATIVE Final   C Diff toxin NEGATIVE NEGATIVE Final   C Diff interpretation No C. difficile detected.  Final  Gastrointestinal Panel by PCR , Stool     Status: None   Collection Time: 05/31/16 11:30 PM  Result Value Ref Range Status   Campylobacter species NOT DETECTED NOT DETECTED Final   Plesimonas shigelloides NOT DETECTED NOT DETECTED Final   Salmonella species NOT DETECTED NOT DETECTED Final   Yersinia  enterocolitica NOT DETECTED NOT DETECTED Final   Vibrio species NOT DETECTED NOT DETECTED Final   Vibrio cholerae NOT DETECTED NOT DETECTED Final   Enteroaggregative E coli (EAEC) NOT DETECTED NOT DETECTED Final   Enteropathogenic E coli (EPEC) NOT DETECTED NOT DETECTED Final   Enterotoxigenic E coli (ETEC) NOT DETECTED NOT DETECTED Final   Shiga like toxin producing E coli (STEC) NOT DETECTED NOT DETECTED Final   Shigella/Enteroinvasive E coli (EIEC) NOT DETECTED NOT DETECTED Final   Cryptosporidium NOT DETECTED NOT DETECTED Final   Cyclospora cayetanensis NOT DETECTED NOT DETECTED Final   Entamoeba histolytica NOT DETECTED NOT DETECTED Final   Giardia lamblia NOT DETECTED NOT DETECTED Final   Adenovirus F40/41 NOT DETECTED NOT DETECTED Final   Astrovirus NOT DETECTED NOT DETECTED Final   Norovirus GI/GII NOT DETECTED NOT DETECTED Final   Rotavirus A NOT DETECTED NOT DETECTED Final   Sapovirus (I, II, IV, and V) NOT DETECTED NOT DETECTED Final    Time coordinating discharge: Over 30 minutes  Vance Gather, MD  [facility] 06/05/2016, 12:28 PM Pager [phone]  If 7PM-7AM, please  contact night-coverage [email or web address] Password [email or web address]

## 2016-06-05 NOTE — Progress Notes (Signed)
Pharmacy Antibiotic Note  Yolanda James is a 82 y.o. female admitted on 05/31/2016 with bacteremia.  Pharmacy has been consulted for ciprofloxacin dosing.  Today, 06/05/2016 Day #5 therapy - Changing from IV ceftazidime to PO ciprofloxacin to complete course for pseudomonas bacteremia.  Per d/w ID, suggested 10-days of total therapy would be sufficient and repeating blood cultures was not necessary.   Plan:  Based on current renal function, ciprofloxacin 750mg  PO BID. Suggest 5 additional days to complete course   Note ciprofloxacin may interaction with warfarin, suggest warfarin 1.25mg  daily once INR falls < 2 with frequent monitoring of INR (ie. At least 2x/week) until INR stable on warfarin regimen  Height: 5\' 6"  (167.6 cm) Weight: 216 lb 4.3 oz (98.1 kg) IBW/kg (Calculated) : 59.3  Temp (24hrs), Avg:97.6 F (36.4 C), Min:97.3 F (36.3 C), Max:98 F (36.7 C)   Recent Labs Lab 06/01/16 0049  06/01/16 2010 06/02/16 0334 06/03/16 0321 06/04/16 0319 06/05/16 0327  WBC 1.0*  < > 2.4* 3.4* 9.4 15.5* 20.1*  CREATININE 0.79  --   --  0.91 0.78 0.91 0.73  < > = values in this interval not displayed.  Estimated Creatinine Clearance: 62.9 mL/min (by C-G formula based on SCr of 0.73 mg/dL).    No Known Allergies  Antimicrobials this admission: 11/11 Zosyn >> 11/14 11/12 Cipro >> 11/14 11/14 ceftazidime >> 11/16  Dose adjustments this admission:  Microbiology results: 11/11 BCx: 2/2 GNR - pseudomonas (pan- susc) 11/11 GI panel: neg 11/11 C.diff: neg/neg 11/11 MRSA PCR: neg  Thank you for allowing pharmacy to be a part of this patient's care.  Doreene Eland, PharmD, BCPS.   Pager: [phone] 06/05/2016 12:29 PM

## 2016-06-05 NOTE — Clinical Social Work Placement (Signed)
Awaiting returned phone call from daughter in regards to SNF placement decision, however patient has decided on [facility] during initial assessment.   CLINICAL SOCIAL WORK PLACEMENT  NOTE  Date:  06/05/2016  Patient Details  Name: Yolanda James MRN: [id number] Date of Birth: [birth year]  Clinical Social Work is seeking post-discharge placement for this patient at the [place] level of care (*CSW will initial, date and re-position this form in  chart as items are completed):  Yes   Patient/family provided with [place] Work Department's list of facilities offering this level of care within the geographic area requested by the patient (or if unable, by the patient's family).  Yes   Patient/family informed of their freedom to choose among providers that offer the needed level of care, that participate in Medicare, Medicaid or managed care program needed by the patient, have an available bed and are willing to accept the patient.  Yes   Patient/family informed of [place]'s ownership interest in [facility] and [facility], as well as of the fact that they are under no obligation to receive care at these facilities.  PASRR submitted to EDS on 06/03/16     PASRR number received on       Existing PASRR number confirmed on 06/03/16     FL2 transmitted to all facilities in geographic area requested by pt/family on 06/03/16     FL2 transmitted to all facilities within larger geographic area on       Patient informed that his/her managed care company has contracts with or will negotiate with certain facilities, including the following:        Yes   Patient/family informed of bed offers received.  Patient chooses bed at  [facility] and [place] )     Physician recommends and patient chooses bed at      Patient to be transferred to  [facility]) on 06/05/16.  Patient to be transferred to facility by  [facility] )      Patient family notified on 06/05/16 of transfer.  Name of family member notified:   (Pt's dtr, [place] )     PHYSICIAN Please sign FL2, Please prepare priority discharge summary, including medications     Additional Comment:    _______________________________________________ Glendon Axe A 06/05/2016, 10:42 AM

## 2016-06-05 NOTE — Progress Notes (Signed)
Physical Therapy Treatment Patient Details Name: Yolanda James MRN: [id number] DOB: [birth year] Today's Date: 06/05/2016    History of Present Illness Yolanda James is an 82 y.o. female adm with lower GI bleed Past medical history of ovarian ca On chemotherapy, A. Fib on chronic anticoag, HTN, DM, R THA    PT Comments    Progressing slowly with mobility. HR up to 140s during session-RN aware. Continue to recommend SNF.   Follow Up Recommendations  SNF     Equipment Recommendations  None recommended by PT    Recommendations for Other Services       Precautions / Restrictions Precautions Precautions: Fall Restrictions Weight Bearing Restrictions: No    Mobility  Bed Mobility               General bed mobility comments: oob in recliner  Transfers Overall transfer level: Needs assistance Equipment used: Rolling walker (2 wheeled) Transfers: Sit to/from Stand Sit to Stand: Mod assist         General transfer comment: Assist to rise, stabilize, control descent. VCs safety, hand placement.   Ambulation/Gait Ambulation/Gait assistance: Min assist Ambulation Distance (Feet): 25 Feet Assistive device: Rolling walker (2 wheeled) Gait Pattern/deviations: Step-through pattern;Decreased stride length     General Gait Details: Assist to stabilize. slow gait speed. dyspnea 2/4   Stairs            Wheelchair Mobility    Modified Rankin (Stroke Patients Only)       Balance                                    Cognition Arousal/Alertness: Awake/alert Behavior During Therapy: WFL for tasks assessed/performed Overall Cognitive Status: Within Functional Limits for tasks assessed                      Exercises      General Comments        Pertinent Vitals/Pain Pain Assessment: No/denies pain    Home Living                      Prior Function            PT Goals (current goals can now be found in the care  plan section) Progress towards PT goals: Progressing toward goals    Frequency    Min 3X/week      PT Plan Current plan remains appropriate    Co-evaluation             End of Session Equipment Utilized During Treatment: Gait belt Activity Tolerance: Patient limited by fatigue Patient left: in chair;with call bell/phone within reach;with chair alarm set     Time: [phone] PT Time Calculation (min) (ACUTE ONLY): 21 min  Charges:  $Gait Training: 8-22 mins                    G Codes:      Weston Anna, MPT Pager: [phone]

## 2016-06-06 ENCOUNTER — Encounter: Payer: Self-pay | Admitting: Internal Medicine

## 2016-06-06 ENCOUNTER — Non-Acute Institutional Stay (SKILLED_NURSING_FACILITY): Payer: Medicare Other | Admitting: Internal Medicine

## 2016-06-06 DIAGNOSIS — Z7901 Long term (current) use of anticoagulants: Secondary | ICD-10-CM

## 2016-06-06 DIAGNOSIS — D72828 Other elevated white blood cell count: Secondary | ICD-10-CM | POA: Diagnosis not present

## 2016-06-06 DIAGNOSIS — E785 Hyperlipidemia, unspecified: Secondary | ICD-10-CM

## 2016-06-06 DIAGNOSIS — I482 Chronic atrial fibrillation, unspecified: Secondary | ICD-10-CM

## 2016-06-06 DIAGNOSIS — D62 Acute posthemorrhagic anemia: Secondary | ICD-10-CM | POA: Diagnosis not present

## 2016-06-06 DIAGNOSIS — D696 Thrombocytopenia, unspecified: Secondary | ICD-10-CM

## 2016-06-06 DIAGNOSIS — E440 Moderate protein-calorie malnutrition: Secondary | ICD-10-CM

## 2016-06-06 DIAGNOSIS — R7881 Bacteremia: Secondary | ICD-10-CM | POA: Diagnosis not present

## 2016-06-06 DIAGNOSIS — Z8719 Personal history of other diseases of the digestive system: Secondary | ICD-10-CM | POA: Diagnosis not present

## 2016-06-06 DIAGNOSIS — R5381 Other malaise: Secondary | ICD-10-CM

## 2016-06-06 DIAGNOSIS — R600 Localized edema: Secondary | ICD-10-CM

## 2016-06-06 DIAGNOSIS — K5900 Constipation, unspecified: Secondary | ICD-10-CM

## 2016-06-06 DIAGNOSIS — C569 Malignant neoplasm of unspecified ovary: Secondary | ICD-10-CM

## 2016-06-06 DIAGNOSIS — E669 Obesity, unspecified: Secondary | ICD-10-CM

## 2016-06-06 DIAGNOSIS — E1169 Type 2 diabetes mellitus with other specified complication: Secondary | ICD-10-CM

## 2016-06-06 NOTE — Progress Notes (Signed)
LOCATION: [facility]  PCP: [name], MD   Code Status: Full Code  Goals of care: Advanced Directive information Advanced Directives 05/31/2016  Does patient have an advance directive? No  Type of Advance Directive Healthcare Power of Attorney  Copy of advanced directive(s) in chart? No - copy requested       Extended Emergency Contact Information Primary Emergency Contact: Evans,Darla Address: [place], [place] [address] Johnnette Litter of [place] Phone: [phone] Relation: Daughter Secondary Emergency Contact: Mikael Spray, [place] [address] Johnnette Litter of [place] Phone: [phone] Relation: Sister   No Known Allergies  Chief Complaint  Patient presents with  . New Admit To SNF    New Admission Visit     HPI:  Patient is a 82 y.o. female seen today for short term rehabilitation post hospital admission from 05/31/16-06/05/16 with bloody diarrhea along with drop in hemoglobin and supratherapeutic INR. She received vitamin K and FFP and was seen by GI team and oncology. Her bleeding was thought to be diverticular bleed. She was started on iv antibiotics for pseudomonas bacteremia. She received G-CSF and decadron in hospital. She has history of ovarian cancer and had recently received her first cycle of chemotherapy, afib on coumadin. She is seen in her room today.   Review of Systems:  Constitutional: Negative for fever, chills, diaphoresis. Feels weak and tired. HENT: Negative for headache, congestion, nasal discharge, mouth sores, dysphagia Eyes: Negative for blurred vision, double vision and discharge.  Respiratory: Negative for wheezing. Positive for dry cough and dyspnea with exertion.   Cardiovascular: Negative for chest pain, palpitations, leg swelling.  Gastrointestinal: Negative for heartburn, nausea, vomiting, abdominal pain. Positive for poor appetite. Last bowel movement was yesterday. Denies  rectal bleed and diarrhea.  Genitourinary: Negative for dysuria, hematuria and flank pain.  Musculoskeletal: Negative for fall in the facility.  Skin: Negative for itching, rash.  Neurological: Positive for dizziness with change of position. Psychiatric/Behavioral: Negative for depression. Positive for anxiety   Past Medical History:  Diagnosis Date  . Acute blood loss anemia   . Chronic a-fib ([facility])    a. DCCV 02/05/2007. b. sotalol stopped 2012. c. managed with rate control.  . Chronic diastolic CHF (congestive heart failure) ([facility])    associated with AFIB RVR  . Essential hypertension   . Glaucoma   . Hypokalemia   . Long term current use of anticoagulant therapy   . Osteoarthritis    hands and knees, back & knees & "all over"   . Pure hypercholesterolemia   . Slow transit constipation   . Type 2 diabetes mellitus without complication, with long-term current use of insulin [facility])    Past Surgical History:  Procedure Laterality Date  . EYE SURGERY     laser & cataracts removed & IOL both eyes, glaucoma surgery   . GANGLION CYST EXCISION Left 1965   wrist  . TOTAL HIP ARTHROPLASTY Right 09/03/2015   Procedure: TOTAL HIP ARTHROPLASTY;  Surgeon: Frederik Pear, MD;  Location: [facility];  Service: Orthopedics;  Laterality: Right;  . vaginal births     x3   Social History:   reports that she has quit smoking. She has never used smokeless tobacco. She reports that she does not drink alcohol or use drugs.  Family History  Problem Relation Age of Onset  . Arrhythmia Sister   . Heart attack  Sister     Medications:   Medication List       Accurate as of 06/06/16 11:39 AM. Always use your most recent med list.          atorvastatin 80 MG tablet Commonly known as:  LIPITOR TAKE 1 TABLET(80 MG) BY MOUTH DAILY   ciprofloxacin 750 MG tablet Commonly known as:  CIPRO Take 1 tablet (750 mg total) by mouth 2 (two) times daily.   Co Q 10 100 MG Caps Take 1 capsule by mouth daily.    diltiazem 240 MG 24 hr capsule Commonly known as:  CARDIZEM CD Take 1 capsule (240 mg total) by mouth daily.   dorzolamide 2 % ophthalmic solution Commonly known as:  TRUSOPT Place 1 drop into both eyes 2 (two) times daily.   Fish Oil 1000 MG Caps Take 1,000 mg by mouth 2 (two) times daily.   freestyle lancets every 3 (three) days. Reported on 09/11/2015   FREESTYLE LITE test strip Generic drug:  glucose blood every 3 (three) days. Reported on 09/11/2015   furosemide 40 MG tablet Commonly known as:  LASIX Take 1 tablet daily. May take additional dose once weekly for swelling.   glipiZIDE 2.5 MG 24 hr tablet Commonly known as:  GLUCOTROL XL Take 2.5 mg by mouth 2 (two) times daily.   LUMIGAN 0.01 % Soln Generic drug:  bimatoprost Place 1 drop into both eyes at bedtime.   metFORMIN 500 MG tablet Commonly known as:  GLUCOPHAGE Take 500 mg by mouth 2 (two) times daily with a meal.   metoprolol 50 MG tablet Commonly known as:  LOPRESSOR Take 1 tablet (50 mg total) by mouth 2 (two) times daily.   ondansetron 8 MG tablet Commonly known as:  ZOFRAN Take 1 tablet (8 mg total) by mouth every 8 (eight) hours as needed for nausea or vomiting.   pilocarpine 4 % ophthalmic solution Commonly known as:  PILOCAR Place 1 drop into both eyes 4 (four) times daily.   polyethylene glycol packet Commonly known as:  MIRALAX / GLYCOLAX Take 17 g by mouth daily as needed.   potassium chloride 10 MEQ CR capsule Commonly known as:  MICRO-K Take 10 mEq by mouth daily.   timolol 0.5 % ophthalmic solution Commonly known as:  TIMOPTIC Place 1 drop into both eyes 2 (two) times daily.       Immunizations: Immunization History  Administered Date(s) Administered  . PPD Test 06/05/2016     Physical Exam: Vitals:   06/06/16 1134  BP: 133/75  Pulse: 95  Resp: 16  Temp: 97.1 F (36.2 C)  TempSrc: Oral  SpO2: 97%  Weight: 236 lb 6.4 oz (107.2 kg)  Height: 5\' 6"  (1.676 m)    Body mass index is 38.16 kg/m.  General- elderly female, obese, in no acute distress Head- normocephalic, atraumatic Nose- no maxillary or frontal sinus tenderness, no nasal discharge Throat- moist mucus membrane, has upper and lower dentures, no oral thrush Eyes- PERRLA, EOMI, no pallor, no icterus Neck- no cervical lymphadenopathy Cardiovascular- irregular heart rate, no murmur Respiratory- bilateral clear to auscultation, no wheeze, no rhonchi, no crackles, no use of accessory muscles Abdomen- bowel sounds present, soft, non tender, distended Musculoskeletal- able to move all 4 extremities, generalized weakness, trace arm edema and 1+ leg edema Neurological- alert and oriented to person, place and time Skin- warm and dry, easy bruising Psychiatry- normal mood and affect    Labs reviewed: Basic Metabolic Panel:  Recent Labs  05/28/16 0919  06/03/16 0321 06/04/16 0319 06/05/16 0327  NA 137  < > 133* 137 140  K 3.8  < > 3.6 3.1* 3.7  CL  --   < > 102 103 110  CO2 27  < > 22 25 21*  GLUCOSE 104  < > 165* 129* 134*  BUN 24.6  < > 26* 25* 26*  CREATININE 0.7  < > 0.78 0.91 0.73  CALCIUM 8.2*  < > 7.6* 8.0* 7.9*  MG 1.3*  --   --   --   --   < > = values in this interval not displayed. Liver Function Tests:  Recent Labs  05/20/16 1509 05/28/16 0919 05/31/16 0846  AST 20 42* 36  ALT 14 33 27  ALKPHOS 171* 152* 109  BILITOT 0.62 0.77 1.2  PROT 6.2* 5.7* 5.8*  ALBUMIN 2.5* 2.4* 2.7*    Recent Labs  05/31/16 0846  LIPASE 11   No results for input(s): AMMONIA in the last 8760 hours. CBC:  Recent Labs  06/03/16 0321 06/04/16 0319 06/05/16 0327  WBC 9.4 15.5* 20.1*  NEUTROABS 7.8* 13.5* 17.5*  HGB 9.5* 10.3* 10.0*  HCT 29.4* 32.1* 31.2*  MCV 77.6* 77.2* 77.4*  PLT 82* 91* 72*   Cardiac Enzymes: No results for input(s): CKTOTAL, CKMB, CKMBINDEX, TROPONINI in the last 8760 hours. BNP: Invalid input(s): POCBNP CBG:  Recent Labs  06/05/16 0350  06/05/16 0745 06/05/16 1159  GLUCAP 127* 106* 136*    Radiological Exams: Ct Abdomen Pelvis Wo Contrast  Result Date: 06/03/2016 CLINICAL DATA:  Drop in hemoglobin, evaluate for retroperitoneal bleed EXAM: CT ABDOMEN AND PELVIS WITHOUT CONTRAST TECHNIQUE: Multidetector CT imaging of the abdomen and pelvis was performed following the standard protocol without IV contrast. COMPARISON:  05/07/2016 FINDINGS: Lower chest: Moderate left pleural effusion. Small right pleural effusion. Hepatobiliary: Diminutive liver size with moderate perihepatic ascites. No focal gallbladder abnormality. Pancreas: Unremarkable. No pancreatic ductal dilatation or surrounding inflammatory changes. Spleen: Normal in size without focal abnormality. Adrenals/Urinary Tract: Adrenal glands are unremarkable. Kidneys are normal, without renal calculi, focal lesion, or hydronephrosis. Bladder is unremarkable. Stomach/Bowel: Stomach is within normal limits. Appendix appears normal. No evidence of bowel wall thickening, distention, or inflammatory changes. Vascular/Lymphatic: Normal caliber abdominal aorta. Abdominal aortic atherosclerosis. No lymphadenopathy. Reproductive: Soft tissue density seen in the cul-de-sac of indeterminate etiology. Other: Small amount of abdominal and pelvic ascites. Heterogeneous masslike area in the left anterior omentum measuring approximately 4 x 2.5 cm better seen on prior abdominal CT dated 05/07/2016 concerning for malignancy. Musculoskeletal: Right total hip arthroplasty without failure complication. Generalized osteopenia. Degenerative disc disease with disc height loss at L4-5 and L5-S1. Grade 1 anterolisthesis of L5 on S1 with bilateral facet arthropathy. IMPRESSION: 1. Heterogeneous masslike area in the left anterior omentum measuring approximately 4 x 2.5 cm better seen on prior abdominal CT dated 05/07/2016 concerning for malignancy. Small amount of abdominal and pelvic ascites. 2. Moderate left and  small right pleural effusion. 3.  Aortic Atherosclerosis (ICD10-170.0) Electronically Signed   By: Kathreen Devoid   On: 06/03/2016 11:16   Dg Chest 2 View  Result Date: 05/31/2016 CLINICAL DATA:  Pt states she is "wore out". Recent chemo treatment for ovarian cancer. Denies any chest complaints. H/o CHF, Chronic a-fib, and type 2 diabetes. Former smoker. EXAM: CHEST  2 VIEW COMPARISON:  08/27/2015 FINDINGS: Small left pleural effusion. No focal consolidation. No pneumothorax. Stable cardiomediastinal silhouette. No acute osseous abnormality. IMPRESSION: Small left pleural effusion. Electronically  Signed   By: Kathreen Devoid   On: 05/31/2016 13:19   US Abdomen Complete  Result Date: 06/01/2016 CLINICAL DATA:  Patient with diffuse abdominal pain for 3 days. EXAM: ABDOMEN ULTRASOUND COMPLETE COMPARISON:  CT abdomen pelvis 05/07/2016. FINDINGS: Gallbladder: No gallstones or wall thickening visualized. No sonographic Murphy sign noted by sonographer. Common bile duct: Diameter: 5 mm Liver: No focal lesion identified. Within normal limits in parenchymal echogenicity. IVC: No abnormality visualized. Pancreas: Visualized portion unremarkable. Spleen: Size and appearance within normal limits. Right Kidney: Length: 10.6 cm. Echogenicity within normal limits. No mass or hydronephrosis visualized. Left Kidney: Length: 8.4 cm. Echogenicity within normal limits. No mass or hydronephrosis visualized. Abdominal aorta: No aneurysm visualized. Other findings: Small amount of ascites. IMPRESSION: Small amount of ascites. No acute process within the abdomen. Electronically Signed   By: Lovey Newcomer M.D.   On: 06/01/2016 13:36   US Paracentesis  Result Date: 05/28/2016 INDICATION: Patient with history of GYN malignancy, probable ovarian origin, recurrent ascites. Request made for therapeutic paracentesis. EXAM: ULTRASOUND GUIDED THERAPEUTIC PARACENTESIS MEDICATIONS: None. COMPLICATIONS: None immediate. PROCEDURE: Informed  written consent was obtained from the patient after a discussion of the risks, benefits and alternatives to treatment. A timeout was performed prior to the initiation of the procedure. Initial ultrasound scanning demonstrates a small amount of ascites within the right lower abdominal quadrant. The right lower abdomen was prepped and draped in the usual sterile fashion. 1% lidocaine was used for local anesthesia. Following this, a Yueh catheter was introduced. An ultrasound image was saved for documentation purposes. The paracentesis was performed. The catheter was removed and a dressing was applied. The patient tolerated the procedure well without immediate post procedural complication. FINDINGS: A total of approximately 1.5 liters of slightly hazy, amber fluid was removed. IMPRESSION: Successful ultrasound-guided therapeutic paracentesis yielding 1.5 liters of peritoneal fluid. Read by: Rowe Robert, PA-C Electronically Signed   By: Jerilynn Mages.  Shick M.D.   On: 05/28/2016 12:36   US Paracentesis  Result Date: 05/13/2016 INDICATION: Abdominal distention. Ascites. Newly found omental caking concerning for malignant process. Request diagnostic and therapeutic paracentesis. EXAM: ULTRASOUND GUIDED LEFT LOWER QUADRANT PARACENTESIS MEDICATIONS: None. COMPLICATIONS: None immediate. PROCEDURE: Informed written consent was obtained from the patient after a discussion of the risks, benefits and alternatives to treatment. A timeout was performed prior to the initiation of the procedure. Initial ultrasound scanning demonstrates a large amount of ascites within the left lower abdominal quadrant. The left lower abdomen was prepped and draped in the usual sterile fashion. 1% lidocaine with epinephrine was used for local anesthesia. Following this, a Safe-T-Centesis catheter was introduced. An ultrasound image was saved for documentation purposes. The paracentesis was performed. The catheter was removed and a dressing was applied.  The patient tolerated the procedure well without immediate post procedural complication. FINDINGS: A total of approximately 2.1 L of clear yellow fluid was removed. Samples were sent to the laboratory as requested by the clinical team. IMPRESSION: Successful ultrasound-guided paracentesis yielding 2.1 liters of peritoneal fluid. Read by: Ascencion Dike PA-C Electronically Signed   By: Aletta Edouard M.D.   On: 05/13/2016 14:50     Assessment/Plan  Physical deconditioning Will have her work with physical therapy and occupational therapy team to help with gait training and muscle strengthening exercises.fall precautions. Skin care. Encourage to be out of bed.   Pseudomonas bacteremia Continue and complete course of ciprofloxacin 750 mg bid on 06/10/16. Monitor wbc and temp curve.   Diverticular bleed No further  bleed. Monitor clinically  Blood loss anemia Stable h&h. Monitor cbc. Will need prbc transfusion if hb < 8  Thrombocytopenia No bleed reported. Goal is for platelet to be kept >50,000   Leukocytosis With her pseudomonas bacteremia. Monitor wbc curve  Ovarian cancer S/p 1 cycle of chemotherapy. To follow up with oncology  Protein calorie malnutrition RD to evaluate. Pressure ulcer prophylaxis.   afib Rate controlled. Continue diltiazem and metoprolol current regimen for rate control. Continue coumadin for anticoagulation with goal inr 2-3.  Long term anticoagulation inr today 2.5. Start coumadin 3 mg daily and check inr 06/09/16  Glaucoma Continue her dorzolamide, lumigan, pilocarpine and timolol eye drops  Leg edema Has leg edema and ascites in setting of her ovarian cancer. Continue lasix 40 mg daily and additional 40 mg weekly for weight gain of > 3 lbs. Check weight 3 days a week for now. Add ted hose. Continue kcl. Check bmp  Dm type 2 Monitor cbg reading. Continue her glipizide and metformin, no changes made  HLD Continue atorvastatin  Constipation Continue  miralax daily as needed and monitor    Goals of care: short term rehabilitation   Labs/tests ordered: cbc with diff, cmp 06/10/16.   Family/ staff Communication: reviewed care plan with patient and nursing supervisor    Blanchie Serve, MD Internal Medicine [place], [place] [address] Cell Phone (Monday-Friday 8 am - 5 pm): [phone] On Call: [phone] and follow prompts after 5 pm and on weekends Office Phone: [phone] Office Fax: [phone]

## 2016-06-07 ENCOUNTER — Encounter (HOSPITAL_COMMUNITY): Payer: Self-pay | Admitting: Emergency Medicine

## 2016-06-07 ENCOUNTER — Inpatient Hospital Stay (HOSPITAL_COMMUNITY)
Admission: EM | Admit: 2016-06-07 | Discharge: 2016-06-11 | DRG: 065 | Disposition: A | Discharge status: Skilled Nursing Facility | Payer: Medicare Other | Attending: Internal Medicine | Admitting: Internal Medicine

## 2016-06-07 ENCOUNTER — Emergency Department (HOSPITAL_COMMUNITY): Payer: Medicare Other

## 2016-06-07 DIAGNOSIS — Z7901 Long term (current) use of anticoagulants: Secondary | ICD-10-CM

## 2016-06-07 DIAGNOSIS — E119 Type 2 diabetes mellitus without complications: Secondary | ICD-10-CM | POA: Diagnosis present

## 2016-06-07 DIAGNOSIS — D72829 Elevated white blood cell count, unspecified: Secondary | ICD-10-CM

## 2016-06-07 DIAGNOSIS — I63411 Cerebral infarction due to embolism of right middle cerebral artery: Secondary | ICD-10-CM | POA: Diagnosis present

## 2016-06-07 DIAGNOSIS — R140 Abdominal distension (gaseous): Secondary | ICD-10-CM | POA: Diagnosis not present

## 2016-06-07 DIAGNOSIS — G8194 Hemiplegia, unspecified affecting left nondominant side: Secondary | ICD-10-CM | POA: Diagnosis present

## 2016-06-07 DIAGNOSIS — I5032 Chronic diastolic (congestive) heart failure: Secondary | ICD-10-CM | POA: Diagnosis present

## 2016-06-07 DIAGNOSIS — R531 Weakness: Secondary | ICD-10-CM | POA: Diagnosis not present

## 2016-06-07 DIAGNOSIS — I482 Chronic atrial fibrillation, unspecified: Secondary | ICD-10-CM | POA: Diagnosis present

## 2016-06-07 DIAGNOSIS — R791 Abnormal coagulation profile: Secondary | ICD-10-CM | POA: Diagnosis present

## 2016-06-07 DIAGNOSIS — H409 Unspecified glaucoma: Secondary | ICD-10-CM | POA: Diagnosis present

## 2016-06-07 DIAGNOSIS — Z6839 Body mass index (BMI) 39.0-39.9, adult: Secondary | ICD-10-CM

## 2016-06-07 DIAGNOSIS — Z87891 Personal history of nicotine dependence: Secondary | ICD-10-CM

## 2016-06-07 DIAGNOSIS — F0390 Unspecified dementia without behavioral disturbance: Secondary | ICD-10-CM | POA: Diagnosis present

## 2016-06-07 DIAGNOSIS — R4182 Altered mental status, unspecified: Secondary | ICD-10-CM

## 2016-06-07 DIAGNOSIS — R471 Dysarthria and anarthria: Secondary | ICD-10-CM | POA: Diagnosis present

## 2016-06-07 DIAGNOSIS — R7881 Bacteremia: Secondary | ICD-10-CM

## 2016-06-07 DIAGNOSIS — R180 Malignant ascites: Secondary | ICD-10-CM | POA: Diagnosis present

## 2016-06-07 DIAGNOSIS — D696 Thrombocytopenia, unspecified: Secondary | ICD-10-CM | POA: Diagnosis present

## 2016-06-07 DIAGNOSIS — E876 Hypokalemia: Secondary | ICD-10-CM | POA: Diagnosis present

## 2016-06-07 DIAGNOSIS — E669 Obesity, unspecified: Secondary | ICD-10-CM | POA: Diagnosis present

## 2016-06-07 DIAGNOSIS — Z96641 Presence of right artificial hip joint: Secondary | ICD-10-CM | POA: Diagnosis present

## 2016-06-07 DIAGNOSIS — I639 Cerebral infarction, unspecified: Secondary | ICD-10-CM

## 2016-06-07 DIAGNOSIS — I110 Hypertensive heart disease with heart failure: Secondary | ICD-10-CM | POA: Diagnosis present

## 2016-06-07 DIAGNOSIS — Z792 Long term (current) use of antibiotics: Secondary | ICD-10-CM

## 2016-06-07 DIAGNOSIS — G9340 Encephalopathy, unspecified: Secondary | ICD-10-CM | POA: Diagnosis not present

## 2016-06-07 DIAGNOSIS — R29810 Facial weakness: Secondary | ICD-10-CM | POA: Diagnosis present

## 2016-06-07 DIAGNOSIS — Z7984 Long term (current) use of oral hypoglycemic drugs: Secondary | ICD-10-CM

## 2016-06-07 DIAGNOSIS — C569 Malignant neoplasm of unspecified ovary: Secondary | ICD-10-CM | POA: Diagnosis present

## 2016-06-07 DIAGNOSIS — R414 Neurologic neglect syndrome: Secondary | ICD-10-CM | POA: Diagnosis present

## 2016-06-07 DIAGNOSIS — E785 Hyperlipidemia, unspecified: Secondary | ICD-10-CM | POA: Diagnosis present

## 2016-06-07 DIAGNOSIS — Z9841 Cataract extraction status, right eye: Secondary | ICD-10-CM

## 2016-06-07 DIAGNOSIS — Z9842 Cataract extraction status, left eye: Secondary | ICD-10-CM | POA: Diagnosis not present

## 2016-06-07 DIAGNOSIS — Z961 Presence of intraocular lens: Secondary | ICD-10-CM | POA: Diagnosis present

## 2016-06-07 DIAGNOSIS — Z79899 Other long term (current) drug therapy: Secondary | ICD-10-CM

## 2016-06-07 DIAGNOSIS — IMO0002 Reserved for concepts with insufficient information to code with codable children: Secondary | ICD-10-CM

## 2016-06-07 DIAGNOSIS — I6789 Other cerebrovascular disease: Secondary | ICD-10-CM | POA: Diagnosis not present

## 2016-06-07 LAB — I-STAT VENOUS BLOOD GAS, ED
Acid-base deficit: 3 mmol/L — ABNORMAL HIGH (ref 0.0–2.0)
Bicarbonate: 21.7 mmol/L (ref 20.0–28.0)
O2 Saturation: 93 %
PCO2 VEN: 36.1 mmHg — AB (ref 44.0–60.0)
PH VEN: 7.388 (ref 7.250–7.430)
PO2 VEN: 67 mmHg — AB (ref 32.0–45.0)
TCO2: 23 mmol/L (ref 0–100)

## 2016-06-07 LAB — URINALYSIS, ROUTINE W REFLEX MICROSCOPIC
Bilirubin Urine: NEGATIVE
GLUCOSE, UA: NEGATIVE mg/dL
HGB URINE DIPSTICK: NEGATIVE
Ketones, ur: NEGATIVE mg/dL
Leukocytes, UA: NEGATIVE
Nitrite: NEGATIVE
PROTEIN: 30 mg/dL — AB
Specific Gravity, Urine: 1.02 (ref 1.005–1.030)
pH: 5 (ref 5.0–8.0)

## 2016-06-07 LAB — CBC WITH DIFFERENTIAL/PLATELET
BASOS ABS: 0 10*3/uL (ref 0.0–0.1)
Basophils Relative: 0 %
Eosinophils Absolute: 0 10*3/uL (ref 0.0–0.7)
Eosinophils Relative: 0 %
HEMATOCRIT: 34.4 % — AB (ref 36.0–46.0)
HEMOGLOBIN: 10.8 g/dL — AB (ref 12.0–15.0)
LYMPHS ABS: 0.7 10*3/uL (ref 0.7–4.0)
Lymphocytes Relative: 3 %
MCH: 24.7 pg — AB (ref 26.0–34.0)
MCHC: 31.4 g/dL (ref 30.0–36.0)
MCV: 78.7 fL (ref 78.0–100.0)
MONO ABS: 2.2 10*3/uL — AB (ref 0.1–1.0)
MONOS PCT: 9 %
NEUTROS ABS: 21.6 10*3/uL — AB (ref 1.7–7.7)
Neutrophils Relative %: 88 %
Platelets: 122 10*3/uL — ABNORMAL LOW (ref 150–400)
RBC: 4.37 MIL/uL (ref 3.87–5.11)
RDW: 17.5 % — AB (ref 11.5–15.5)
WBC: 24.5 10*3/uL — ABNORMAL HIGH (ref 4.0–10.5)

## 2016-06-07 LAB — I-STAT CHEM 8, ED
BUN: 20 mg/dL (ref 6–20)
Calcium, Ion: 1.09 mmol/L — ABNORMAL LOW (ref 1.15–1.40)
Chloride: 106 mmol/L (ref 101–111)
Creatinine, Ser: 0.8 mg/dL (ref 0.44–1.00)
Glucose, Bld: 85 mg/dL (ref 65–99)
HEMATOCRIT: 34 % — AB (ref 36.0–46.0)
HEMOGLOBIN: 11.6 g/dL — AB (ref 12.0–15.0)
POTASSIUM: 3.3 mmol/L — AB (ref 3.5–5.1)
Sodium: 141 mmol/L (ref 135–145)
TCO2: 23 mmol/L (ref 0–100)

## 2016-06-07 LAB — URINE MICROSCOPIC-ADD ON: RBC / HPF: NONE SEEN RBC/hpf (ref 0–5)

## 2016-06-07 LAB — I-STAT TROPONIN, ED: TROPONIN I, POC: 0.02 ng/mL (ref 0.00–0.08)

## 2016-06-07 LAB — COMPREHENSIVE METABOLIC PANEL
ALK PHOS: 133 U/L — AB (ref 38–126)
ALT: 27 U/L (ref 14–54)
ANION GAP: 7 (ref 5–15)
AST: 29 U/L (ref 15–41)
Albumin: 2.1 g/dL — ABNORMAL LOW (ref 3.5–5.0)
BILIRUBIN TOTAL: 0.7 mg/dL (ref 0.3–1.2)
BUN: 20 mg/dL (ref 6–20)
CALCIUM: 8.1 mg/dL — AB (ref 8.9–10.3)
CO2: 24 mmol/L (ref 22–32)
Chloride: 109 mmol/L (ref 101–111)
Creatinine, Ser: 0.77 mg/dL (ref 0.44–1.00)
GFR calc non Af Amer: >60 mL/min (ref >60)
Glucose, Bld: 85 mg/dL (ref 65–99)
Potassium: 3.1 mmol/L — ABNORMAL LOW (ref 3.5–5.1)
SODIUM: 140 mmol/L (ref 135–145)
TOTAL PROTEIN: 5.1 g/dL — AB (ref 6.5–8.1)

## 2016-06-07 LAB — PROTIME-INR
INR: 3.5
PROTHROMBIN TIME: 35.9 s — AB (ref 11.4–15.2)

## 2016-06-07 LAB — I-STAT CG4 LACTIC ACID, ED
LACTIC ACID, VENOUS: 1.37 mmol/L (ref 0.5–1.9)
LACTIC ACID, VENOUS: 0.81 mmol/L (ref 0.5–1.9)

## 2016-06-07 LAB — GLUCOSE, CAPILLARY: GLUCOSE-CAPILLARY: 73 mg/dL (ref 65–99)

## 2016-06-07 LAB — BRAIN NATRIURETIC PEPTIDE: B NATRIURETIC PEPTIDE 5: 369.6 pg/mL — AB (ref 0.0–100.0)

## 2016-06-07 MED ORDER — TIMOLOL MALEATE 0.5 % OP SOLN
1.0000 [drp] | Freq: Two times a day (BID) | OPHTHALMIC | Status: DC
  Administered 2016-06-08 – 2016-06-11 (×7): 1 [drp] via OPHTHALMIC
  Filled 2016-06-07: qty 5

## 2016-06-07 MED ORDER — LATANOPROST 0.005 % OP SOLN
1.0000 [drp] | Freq: Every day | OPHTHALMIC | Status: DC
  Administered 2016-06-08 – 2016-06-10 (×3): 1 [drp] via OPHTHALMIC
  Filled 2016-06-07: qty 2.5

## 2016-06-07 MED ORDER — WARFARIN - PHARMACIST DOSING INPATIENT: Freq: Every day | Status: DC

## 2016-06-07 MED ORDER — FUROSEMIDE 40 MG PO TABS
40.0000 mg | ORAL_TABLET | Freq: Every day | ORAL | Status: DC
  Administered 2016-06-08: 40 mg via ORAL
  Filled 2016-06-07: qty 1

## 2016-06-07 MED ORDER — DILTIAZEM HCL ER COATED BEADS 240 MG PO CP24: 240.0000 mg | ORAL_CAPSULE | Freq: Every day | ORAL | Status: DC

## 2016-06-07 MED ORDER — METOPROLOL TARTRATE 25 MG PO TABS: 25.0000 mg | ORAL_TABLET | Freq: Two times a day (BID) | ORAL | Status: DC

## 2016-06-07 MED ORDER — CIPROFLOXACIN HCL 500 MG PO TABS
750.0000 mg | ORAL_TABLET | Freq: Two times a day (BID) | ORAL | Status: DC
  Administered 2016-06-08 – 2016-06-11 (×8): 750 mg via ORAL
  Filled 2016-06-07 (×8): qty 1

## 2016-06-07 MED ORDER — INSULIN ASPART 100 UNIT/ML ~~LOC~~ SOLN: 0.0000 [IU] | Freq: Every day | SUBCUTANEOUS | Status: DC

## 2016-06-07 MED ORDER — STROKE: EARLY STAGES OF RECOVERY BOOK
Freq: Once | Status: AC
  Administered 2016-06-08: 10:00:00
  Filled 2016-06-07: qty 1

## 2016-06-07 MED ORDER — POLYETHYLENE GLYCOL 3350 17 G PO PACK: 17.0000 g | PACK | Freq: Every day | ORAL | Status: DC | PRN

## 2016-06-07 MED ORDER — POTASSIUM CHLORIDE CRYS ER 20 MEQ PO TBCR
20.0000 meq | EXTENDED_RELEASE_TABLET | Freq: Every day | ORAL | Status: DC
  Administered 2016-06-08: 20 meq via ORAL
  Filled 2016-06-07: qty 1

## 2016-06-07 MED ORDER — SODIUM CHLORIDE 0.9 % IV BOLUS (SEPSIS)
500.0000 mL | Freq: Once | INTRAVENOUS | Status: AC
  Administered 2016-06-07: 500 mL via INTRAVENOUS

## 2016-06-07 MED ORDER — INSULIN ASPART 100 UNIT/ML ~~LOC~~ SOLN: 0.0000 [IU] | Freq: Three times a day (TID) | SUBCUTANEOUS | Status: DC

## 2016-06-07 MED ORDER — DORZOLAMIDE HCL 2 % OP SOLN
1.0000 [drp] | Freq: Two times a day (BID) | OPHTHALMIC | Status: DC
  Administered 2016-06-08 – 2016-06-11 (×7): 1 [drp] via OPHTHALMIC
  Filled 2016-06-07: qty 10

## 2016-06-07 MED ORDER — ATORVASTATIN CALCIUM 80 MG PO TABS
80.0000 mg | ORAL_TABLET | Freq: Every day | ORAL | Status: DC
  Administered 2016-06-08 – 2016-06-11 (×4): 80 mg via ORAL
  Filled 2016-06-07 (×4): qty 1

## 2016-06-07 NOTE — H&P (Signed)
History and Physical    Yolanda James [id number] DOB: 1931/07/15 DOA: 06/07/2016  Referring MD/NP/PA: er PCP: [name], MD Outpatient Specialists: Benay Spice (oncology), Lacie Draft (cardiology) Patient coming from: SNF- [facility]  Chief Complaint: facial drooping  HPI: Yolanda James is a 82 y.o. female with medical history significant of ovarian CA on chemotherapy, A. Fib on chronic coumadin who presented at [facility] on 11/11 with BRBPR and melena. Hgb was 9 from previous of 13, INR >10. In the ED she received IV vitamin K and FFP. GI, Dr. Watt Climes, evluated patient and recommended no interventions. She was also having fevers since her last chemotherapy and she was leukopenic with thrombocytopenia. Blood cultures from admission grew positive for pseudomonas aeruginosa, she was started coverage with zosyn and cipro on 11.12.2017, de-escalated to [place] on 11.13.2017, and changed to oral ciprofloxacin on 11/16, to complete a 10-day course. Neutropenia resolved s/p G-CSF per oncology. Also during that admission she was found on A.fib with RVR.  Due to risk of CVA with AFib, it is thought she should continue anticoagulation.   She was d/c'd to [facility] on 11/16.  She was last seen normal on Friday.  Today she was found to have slurred speech and a facial droop.  Her left arm appeared to have a drift as well.    ED Course: IN the ER, a CT head was done with out concerning findings, ER doctor spoke with neurology who suggested patient be admitted for TIA/CVA work up.   Review of Systems: all systems reviewed, negative unless stated above in HPI   Past Medical History:  Diagnosis Date  . Acute blood loss anemia   . Chronic a-fib ([place])    a. DCCV 02/05/2007. b. sotalol stopped 2012. c. managed with rate control.  . Chronic diastolic CHF (congestive heart failure) (HCC)    associated with AFIB RVR  . Essential hypertension   . Glaucoma   . Hypokalemia   . Long term current use of  anticoagulant therapy   . Osteoarthritis    hands and knees, back & knees & "all over"   . Pure hypercholesterolemia   . Slow transit constipation   . Type 2 diabetes mellitus without complication, with long-term current use of insulin [facility])     Past Surgical History:  Procedure Laterality Date  . EYE SURGERY     laser & cataracts removed & IOL both eyes, glaucoma surgery   . GANGLION CYST EXCISION Left 1965   wrist  . TOTAL HIP ARTHROPLASTY Right 09/03/2015   Procedure: TOTAL HIP ARTHROPLASTY;  Surgeon: Frederik Pear, MD;  Location: [facility];  Service: Orthopedics;  Laterality: Right;  . vaginal births     x3     reports that she has quit smoking. She has never used smokeless tobacco. She reports that she does not drink alcohol or use drugs.  No Known Allergies  Family History  Problem Relation Age of Onset  . Arrhythmia Sister   . Heart attack Sister      Prior to Admission medications   Medication Sig Start Date End Date Taking? Authorizing Provider  atorvastatin (LIPITOR) 80 MG tablet TAKE 1 TABLET(80 MG) BY MOUTH DAILY 05/06/16   Jettie Booze, MD  ciprofloxacin (CIPRO) 750 MG tablet Take 1 tablet (750 mg total) by mouth 2 (two) times daily. 06/05/16 06/10/16  Patrecia Pour, MD  Coenzyme Q10 (CO Q 10) 100 MG CAPS Take 1 capsule by mouth daily.    Historical Provider, MD  diltiazem (CARDIZEM CD) 240 MG 24 hr capsule Take 1 capsule (240 mg total) by mouth daily. 11/26/15   Brittainy M Simmons, PA-C  dorzolamide (TRUSOPT) 2 % ophthalmic solution Place 1 drop into both eyes 2 (two) times daily.  03/11/16   Historical Provider, MD  FREESTYLE LITE test strip every 3 (three) days. Reported on 09/11/2015 11/30/14   Historical Provider, MD  furosemide (LASIX) 40 MG tablet Take 1 tablet daily. May take additional dose once weekly for swelling. 02/05/16   Jettie Booze, MD  glipiZIDE (GLUCOTROL XL) 2.5 MG 24 hr tablet Take 2.5 mg by mouth 2 (two) times daily.  12/13/13   Historical  Provider, MD  Lancets (FREESTYLE) lancets every 3 (three) days. Reported on 09/11/2015 11/29/14   Historical Provider, MD  LUMIGAN 0.01 % SOLN Place 1 drop into both eyes at bedtime.  09/28/13   Historical Provider, MD  metFORMIN (GLUCOPHAGE) 500 MG tablet Take 500 mg by mouth 2 (two) times daily with a meal.  12/10/13   Historical Provider, MD  metoprolol tartrate (LOPRESSOR) 50 MG tablet Take 1 tablet (50 mg total) by mouth 2 (two) times daily. 06/05/16 09/03/16  Patrecia Pour, MD  Omega-3 Fatty Acids (FISH OIL) 1000 MG CAPS Take 1,000 mg by mouth 2 (two) times daily.    Historical Provider, MD  ondansetron (ZOFRAN) 8 MG tablet Take 1 tablet (8 mg total) by mouth every 8 (eight) hours as needed for nausea or vomiting. 05/22/16   Ladell Pier, MD  pilocarpine (PILOCAR) 4 % ophthalmic solution Place 1 drop into both eyes 4 (four) times daily.  11/23/13   Historical Provider, MD  polyethylene glycol (MIRALAX / GLYCOLAX) packet Take 17 g by mouth daily as needed.     Historical Provider, MD  potassium chloride (MICRO-K) 10 MEQ CR capsule Take 10 mEq by mouth daily.  12/01/15   Historical Provider, MD  timolol (TIMOPTIC) 0.5 % ophthalmic solution Place 1 drop into both eyes 2 (two) times daily.  03/13/16   Historical Provider, MD    Physical Exam: Vitals:   06/07/16 1937 06/07/16 2015 06/07/16 2045 06/07/16 2115  BP:  119/70 114/69 100/63  Pulse:      Resp:  (!) 9 (!) 9 (!) 8  Temp: 98.4 F (36.9 C)     TempSrc:      SpO2:    95%      Constitutional: chronically ill appearing Vitals:   06/07/16 1937 06/07/16 2015 06/07/16 2045 06/07/16 2115  BP:  119/70 114/69 100/63  Pulse:      Resp:  (!) 9 (!) 9 (!) 8  Temp: 98.4 F (36.9 C)     TempSrc:      SpO2:    95%   Eyes: left facial droop ENMT: Mucous membranes are moist. Posterior pharynx clear of any exudate or lesions.Normal dentition.  Neck: normal, supple, no masses, no thyromegaly Respiratory: clear to auscultation bilaterally, no  wheezing, no crackles. Normal respiratory effort. No accessory muscle use.  Cardiovascular: irregular with pitting edema Abdomen: obese, no tenderness, no masses palpated. No hepatosplenomegaly. Bowel sounds positive.  Musculoskeletal: + edema Skin: no rashes, lesions, ulcers. No induration Neurologic: grossly weak, left arm drift,  Slurred speech Psychiatric: able to answer all questions  Labs on Admission: I have personally reviewed following labs and imaging studies  CBC:  Recent Labs Lab 06/02/16 0334 06/03/16 0321 06/04/16 0319 06/05/16 0327 06/07/16 1737 06/07/16 1756  WBC 3.4* 9.4 15.5* 20.1* 24.5*  --  NEUTROABS 2.3 7.8* 13.5* 17.5* 21.6*  --   HGB 9.1* 9.5* 10.3* 10.0* 10.8* 11.6*  HCT 28.4* 29.4* 32.1* 31.2* 34.4* 34.0*  MCV 78.7 77.6* 77.2* 77.4* 78.7  --   PLT 89* 82* 91* 72* 122*  --    Basic Metabolic Panel:  Recent Labs Lab 06/02/16 0334 06/03/16 0321 06/04/16 0319 06/05/16 0327 06/07/16 1737 06/07/16 1756  NA 132* 133* 137 140 140 141  K 3.6 3.6 3.1* 3.7 3.1* 3.3*  CL 103 102 103 110 109 106  CO2 24 22 25  21* 24  --   GLUCOSE 125* 165* 129* 134* 85 85  BUN 27* 26* 25* 26* 20 20  CREATININE 0.91 0.78 0.91 0.73 0.77 0.80  CALCIUM 7.3* 7.6* 8.0* 7.9* 8.1*  --    GFR: Estimated Creatinine Clearance: 66 mL/min (by C-G formula based on SCr of 0.8 mg/dL). Liver Function Tests:  Recent Labs Lab 06/07/16 1737  AST 29  ALT 27  ALKPHOS 133*  BILITOT 0.7  PROT 5.1*  ALBUMIN 2.1*   No results for input(s): LIPASE, AMYLASE in the last 168 hours. No results for input(s): AMMONIA in the last 168 hours. Coagulation Profile:  Recent Labs Lab 06/01/16 2010 06/03/16 0321 06/04/16 0319 06/05/16 0327 06/07/16 1737  INR 1.92 1.71 1.66 2.16 3.50   Cardiac Enzymes: No results for input(s): CKTOTAL, CKMB, CKMBINDEX, TROPONINI in the last 168 hours. BNP (last 3 results) No results for input(s): PROBNP in the last 8760 hours. HbA1C: No results for  input(s): HGBA1C in the last 72 hours. CBG:  Recent Labs Lab 06/04/16 1942 06/04/16 2344 06/05/16 0350 06/05/16 0745 06/05/16 1159  GLUCAP 147* 147* 127* 106* 136*   Lipid Profile: No results for input(s): CHOL, HDL, LDLCALC, TRIG, CHOLHDL, LDLDIRECT in the last 72 hours. Thyroid Function Tests: No results for input(s): TSH, T4TOTAL, FREET4, T3FREE, THYROIDAB in the last 72 hours. Anemia Panel: No results for input(s): VITAMINB12, FOLATE, FERRITIN, TIBC, IRON, RETICCTPCT in the last 72 hours. Urine analysis:    Component Value Date/Time   COLORURINE YELLOW 06/07/2016 1837   APPEARANCEUR HAZY (A) 06/07/2016 1837   LABSPEC 1.020 06/07/2016 1837   PHURINE 5.0 06/07/2016 1837   GLUCOSEU NEGATIVE 06/07/2016 1837   HGBUR NEGATIVE 06/07/2016 1837   BILIRUBINUR NEGATIVE 06/07/2016 [address] 06/07/2016 1837   PROTEINUR 30 (A) 06/07/2016 1837   NITRITE NEGATIVE 06/07/2016 1837   LEUKOCYTESUR NEGATIVE 06/07/2016 1837   Sepsis Labs: Invalid input(s): PROCALCITONIN, LACTICIDVEN Recent Results (from the past 240 hour(s))  Culture, blood (routine x 2)     Status: Abnormal   Collection Time: 05/31/16  1:20 PM  Result Value Ref Range Status   Specimen Description BLOOD RIGHT ANTECUBITAL  Final   Special Requests BOTTLES DRAWN AEROBIC ONLY 6CC  Final   Culture  Setup Time   Final    GRAM NEGATIVE RODS AEROBIC BOTTLE ONLY CRITICAL RESULT CALLED TO, READ BACK BY AND VERIFIED WITH: D WOFFORD,PHARMD AT 1253 06/01/16 BY L BENFIELD    Culture (A)  Final    PSEUDOMONAS AERUGINOSA SUSCEPTIBILITIES PERFORMED ON PREVIOUS CULTURE WITHIN THE LAST 5 DAYS. Performed at [facility]    Report Status 06/03/2016 FINAL  Final  Culture, blood (routine x 2)     Status: Abnormal   Collection Time: 05/31/16  1:32 PM  Result Value Ref Range Status   Specimen Description BLOOD RIGHT ANTECUBITAL  Final   Special Requests BOTTLES DRAWN AEROBIC ONLY 5CC  Final   Culture  Setup Time    Final    GRAM NEGATIVE RODS AEROBIC BOTTLE ONLY CRITICAL RESULT CALLED TO, READ BACK BY AND VERIFIED WITH: D WOFFORD,PHARMD AT 1253 06/01/16 BY L BENFIELD Performed at [place] (A)  Final   Report Status 06/03/2016 FINAL  Final   Organism ID, Bacteria PSEUDOMONAS AERUGINOSA  Final      Susceptibility   Pseudomonas aeruginosa - MIC*    CEFTAZIDIME 4 SENSITIVE Sensitive     CIPROFLOXACIN <=0.25 SENSITIVE Sensitive     GENTAMICIN 2 SENSITIVE Sensitive     IMIPENEM 2 SENSITIVE Sensitive     PIP/TAZO 16 SENSITIVE Sensitive     CEFEPIME 4 SENSITIVE Sensitive     * PSEUDOMONAS AERUGINOSA  Blood Culture ID Panel (Reflexed)     Status: Abnormal   Collection Time: 05/31/16  1:32 PM  Result Value Ref Range Status   Enterococcus species NOT DETECTED NOT DETECTED Final   Listeria monocytogenes NOT DETECTED NOT DETECTED Final   Staphylococcus species NOT DETECTED NOT DETECTED Final   Staphylococcus aureus NOT DETECTED NOT DETECTED Final   Streptococcus species NOT DETECTED NOT DETECTED Final   Streptococcus agalactiae NOT DETECTED NOT DETECTED Final   Streptococcus pneumoniae NOT DETECTED NOT DETECTED Final   Streptococcus pyogenes NOT DETECTED NOT DETECTED Final   Acinetobacter baumannii NOT DETECTED NOT DETECTED Final   Enterobacteriaceae species NOT DETECTED NOT DETECTED Final   Enterobacter cloacae complex NOT DETECTED NOT DETECTED Final   Escherichia coli NOT DETECTED NOT DETECTED Final   Klebsiella oxytoca NOT DETECTED NOT DETECTED Final   Klebsiella pneumoniae NOT DETECTED NOT DETECTED Final   Proteus species NOT DETECTED NOT DETECTED Final   Serratia marcescens NOT DETECTED NOT DETECTED Final   Carbapenem resistance NOT DETECTED NOT DETECTED Final   Haemophilus influenzae NOT DETECTED NOT DETECTED Final   Neisseria meningitidis NOT DETECTED NOT DETECTED Final   Pseudomonas aeruginosa DETECTED (A) NOT DETECTED Final    Comment: CRITICAL  RESULT CALLED TO, READ BACK BY AND VERIFIED WITH: D WOFFORD,PHARMD AT 1253 06/01/16 BY L BENFIELD    Candida albicans NOT DETECTED NOT DETECTED Final   Candida glabrata NOT DETECTED NOT DETECTED Final   Candida krusei NOT DETECTED NOT DETECTED Final   Candida parapsilosis NOT DETECTED NOT DETECTED Final   Candida tropicalis NOT DETECTED NOT DETECTED Final    Comment: Performed at [facility]  MRSA PCR Screening     Status: None   Collection Time: 05/31/16  2:34 PM  Result Value Ref Range Status   MRSA by PCR NEGATIVE NEGATIVE Final    Comment:        The GeneXpert MRSA Assay (FDA approved for NASAL specimens only), is one component of a comprehensive MRSA colonization surveillance program. It is not intended to diagnose MRSA infection nor to guide or monitor treatment for MRSA infections.   C difficile quick scan w PCR reflex     Status: None   Collection Time: 05/31/16 11:30 PM  Result Value Ref Range Status   C Diff antigen NEGATIVE NEGATIVE Final   C Diff toxin NEGATIVE NEGATIVE Final   C Diff interpretation No C. difficile detected.  Final  Gastrointestinal Panel by PCR , Stool     Status: None   Collection Time: 05/31/16 11:30 PM  Result Value Ref Range Status   Campylobacter species NOT DETECTED NOT DETECTED Final   Plesimonas shigelloides NOT DETECTED NOT DETECTED Final   Salmonella species NOT DETECTED  NOT DETECTED Final   Yersinia enterocolitica NOT DETECTED NOT DETECTED Final   Vibrio species NOT DETECTED NOT DETECTED Final   Vibrio cholerae NOT DETECTED NOT DETECTED Final   Enteroaggregative E coli (EAEC) NOT DETECTED NOT DETECTED Final   Enteropathogenic E coli (EPEC) NOT DETECTED NOT DETECTED Final   Enterotoxigenic E coli (ETEC) NOT DETECTED NOT DETECTED Final   Shiga like toxin producing E coli (STEC) NOT DETECTED NOT DETECTED Final   Shigella/Enteroinvasive E coli (EIEC) NOT DETECTED NOT DETECTED Final   Cryptosporidium NOT DETECTED NOT DETECTED  Final   Cyclospora cayetanensis NOT DETECTED NOT DETECTED Final   Entamoeba histolytica NOT DETECTED NOT DETECTED Final   Giardia lamblia NOT DETECTED NOT DETECTED Final   Adenovirus F40/41 NOT DETECTED NOT DETECTED Final   Astrovirus NOT DETECTED NOT DETECTED Final   Norovirus GI/GII NOT DETECTED NOT DETECTED Final   Rotavirus A NOT DETECTED NOT DETECTED Final   Sapovirus (I, II, IV, and V) NOT DETECTED NOT DETECTED Final     Radiological Exams on Admission: Ct Head Wo Contrast  Result Date: 06/07/2016 CLINICAL DATA:  Altered mental status EXAM: CT HEAD WITHOUT CONTRAST TECHNIQUE: Contiguous axial images were obtained from the base of the skull through the vertex without intravenous contrast. COMPARISON:  None. FINDINGS: Brain: No evidence of acute infarction, hemorrhage, extra-axial collection, ventriculomegaly, or mass effect. Periventricular white matter low attenuation likely secondary to microangiopathy. Vascular: Cerebrovascular atherosclerotic calcifications are noted. Skull: Negative for fracture or focal lesion. Sinuses/Orbits: Visualized portions of the orbits are unremarkable. Visualized portions of the paranasal sinuses and mastoid air cells are unremarkable. Other: None. IMPRESSION: No acute intracranial pathology. Electronically Signed   By: Kathreen Devoid   On: 06/07/2016 20:39   Dg Chest Port 1 View  Result Date: 06/07/2016 CLINICAL DATA:  Left-sided weakness.  Pitting edema. EXAM: PORTABLE CHEST 1 VIEW COMPARISON:  May 31, 2016 FINDINGS: There is a small left effusion with underlying opacity, unchanged. No other interval changes or acute abnormalities. IMPRESSION: Stable left pleural effusion with underlying opacity. No other change. Electronically Signed   By: Dorise Bullion III M.D   On: 06/07/2016 17:55    EKG: Independently reviewed. Atrial fibrillation  Assessment/Plan Active Problems:   Chronic atrial fibrillation (HCC)   Ovarian cancer (HCC)   Bacteremia due  to Pseudomonas   Left-sided weakness   Facial droop   Facial droop with left sided weakness -TIA/CVA work up -MRI ordered without contrast although neuro may want to change to with to r/o metastasis -EEG to complete work out -echo.carotids -FLP/HgbA1C   a fib -continue home meds at lower dose -pharmacy to dose coumadin  Ovarian cancer -Dr. Benay Spice -had been getting chemo  DM -SSI -hold PO meds -check HgbA1C  Recent +BC -repeat to ensure clearing -continue cipro to 11/20   Patient still a full code-- may need palliative care consult depending on work up results    DVT prophylaxis: coumadin Code Status: full Family Communication: son and daughter at bedside Disposition Plan: back to SNF? Consults called: neuro called by ER doc Admission status: inpt-- suspect patient will be her > 2 midnights   [facility] Pager [phone]  If 7PM-7AM, please contact night-coverage [email or web address] Password [facility]  06/07/2016, 9:57 PM

## 2016-06-07 NOTE — ED Notes (Signed)
Pt's Son(Kim) and daughter(Darla) want to be called when pt gets a room to know where she is at. Phone numbers are already in system under emergency contacts.

## 2016-06-07 NOTE — Consult Note (Signed)
NEURO HOSPITALIST CONSULT NOTE   Requestig physician: Dr. Eliseo Squires  Reason for Consult: Stroke vs. TIA  History obtained from:  Patient and Chart    HPI:                                                                                                                                          Yolanda James is an 82 y.o. female who presented to the ED with new onset of left facial droop, left hemiparesis and dysarthria. LKN on Friday. She has ovarian CA with malignant ascites, on chemotherapy. Has recent GI bleed due to supratherapeutic INR of > 10 for which she was previously admitted. Also leukopenic and thrombocytopenic. Has recent pseudomonas aeruginosa positive blood cultures for which she was on antibiotics. She has chronic atrial fibrillation, frequently with RVR, and has been anticoagulated with Coumadin, but is thought to also auto-anticoagulate because of her ovarian CA, possibly being the main reason for her successive supratherapeutic INRs. She states that prior to being diagnosed with  Coumadin being dosed by pharmacy this admission and INR supratherapeutic again, so it is being held.   Past Medical History:  Diagnosis Date  . Acute blood loss anemia   . Chronic a-fib ([place])    a. DCCV 02/05/2007. b. sotalol stopped 2012. c. managed with rate control.  . Chronic diastolic CHF (congestive heart failure) (HCC)    associated with AFIB RVR  . Essential hypertension   . Glaucoma   . Hypokalemia   . Long term current use of anticoagulant therapy   . Osteoarthritis    hands and knees, back & knees & "all over"   . Pure hypercholesterolemia   . Slow transit constipation   . Type 2 diabetes mellitus without complication, with long-term current use of insulin [facility])     Past Surgical History:  Procedure Laterality Date  . EYE SURGERY     laser & cataracts removed & IOL both eyes, glaucoma surgery   . GANGLION CYST EXCISION Left 1965   wrist  . TOTAL HIP ARTHROPLASTY  Right 09/03/2015   Procedure: TOTAL HIP ARTHROPLASTY;  Surgeon: Frederik Pear, MD;  Location: [facility];  Service: Orthopedics;  Laterality: Right;  . vaginal births     x3    Family History  Problem Relation Age of Onset  . Arrhythmia Sister   . Heart attack Sister     Social History:  reports that she has quit smoking. She has never used smokeless tobacco. She reports that she does not drink alcohol or use drugs.  No Known Allergies  MEDICATIONS:  Current Facility-Administered Medications:  .   stroke: mapping our early stages of recovery book, , Does not apply, Once, Jessica U Vann, DO .  [START ON 06/08/2016] atorvastatin (LIPITOR) tablet 80 mg, 80 mg, Oral, q1800, Geradine Girt, DO .  ciprofloxacin (CIPRO) tablet 750 mg, 750 mg, Oral, BID, Jessica U Vann, DO .  [START ON 06/08/2016] diltiazem (CARDIZEM CD) 24 hr capsule 240 mg, 240 mg, Oral, Daily, Jessica U Vann, DO .  dorzolamide (TRUSOPT) 2 % ophthalmic solution 1 drop, 1 drop, Both Eyes, BID, Jessica U Vann, DO .  [START ON 06/08/2016] furosemide (LASIX) tablet 40 mg, 40 mg, Oral, Daily, Jessica U Vann, DO .  insulin aspart (novoLOG) injection 0-5 Units, 0-5 Units, Subcutaneous, QHS, Jessica U Vann, DO .  [START ON 06/08/2016] insulin aspart (novoLOG) injection 0-9 Units, 0-9 Units, Subcutaneous, TID WC, Jessica U Vann, DO .  latanoprost (XALATAN) 0.005 % ophthalmic solution 1 drop, 1 drop, Both Eyes, QHS, Jessica U Vann, DO .  metoprolol tartrate (LOPRESSOR) tablet 25 mg, 25 mg, Oral, BID, Jessica U Vann, DO .  polyethylene glycol (MIRALAX / GLYCOLAX) packet 17 g, 17 g, Oral, Daily PRN, Geradine Girt, DO .  [START ON 06/08/2016] potassium chloride SA (K-DUR,KLOR-CON) CR tablet 20 mEq, 20 mEq, Oral, Daily, Jessica U Vann, DO .  timolol (TIMOPTIC) 0.5 % ophthalmic solution 1 drop, 1 drop, Both Eyes, BID, Jessica U  Vann, DO .  Warfarin - Pharmacist Dosing Inpatient, , Does not apply, q1800, Almeta Monas Bajbus, RPH, Stopped at 06/07/16 2217   ROS:                                                                                                                                       History obtained from patient. Denies headache, chest pain or limb pain. Endorses limb swelling diffusely as well as left sided weakness. Denies confusion or difficulty speaking.   Blood pressure 107/60, pulse 108, temperature 98.4 F (36.9 C), resp. rate 11, SpO2 95 %.   General Examination:                                                                                                      HEENT-  Normocephalic/atraumatic. Lungs- Respirations unlabored.  Extremities- Pitting edema bilateral lower extremities.   Neurological Examination Mental Status: Alert, oriented to month, city, state but not day. Thought content appropriate. Able to follow all commands and converse. Speech fluent without errors of syntax. Mild dysarthria noted. Naming  intact. Cranial Nerves: II: Visual fields intact to confrontation, PERRL III,IV, VI: ptosis not present, EOMI intact with saccadic visual pursuits noted V,VII: Left facial droop. Facial light touch sensation normal bilaterally VIII: hearing intact to questions and commands IX,X: unable to visualize margins of palate XI: Decreased strength on left XII: midline tongue extension Motor: Right : Upper extremity   4+/5    Left:     Upper extremity   4-/5  Lower extremity   2/5 proximal, 4/5 distal  Lower extremity   2/5 proximal, 4-/5 distal Prominent lower extremity edema limits motor exam Sensory: Temperature sensation equal x 4. Decreased FT sensation LLE and LUE. Positive for left sided extinction.  Deep Tendon Reflexes: Limited by lower extremity edema: 0 patellae and ankles. Upper extremities 1+ bilaterally Plantars: Right: downgoing   Left: downgoing Cerebellar: No ataxia with FNF  bilaterally Gait: Deferred due to falls risk concerns   Lab Results: Basic Metabolic Panel:  Recent Labs Lab 06/02/16 0334 06/03/16 0321 06/04/16 0319 06/05/16 0327 06/07/16 1737 06/07/16 1756  NA 132* 133* 137 140 140 141  K 3.6 3.6 3.1* 3.7 3.1* 3.3*  CL 103 102 103 110 109 106  CO2 24 22 25  21* 24  --   GLUCOSE 125* 165* 129* 134* 85 85  BUN 27* 26* 25* 26* 20 20  CREATININE 0.91 0.78 0.91 0.73 0.77 0.80  CALCIUM 7.3* 7.6* 8.0* 7.9* 8.1*  --     Liver Function Tests:  Recent Labs Lab 06/07/16 1737  AST 29  ALT 27  ALKPHOS 133*  BILITOT 0.7  PROT 5.1*  ALBUMIN 2.1*   No results for input(s): LIPASE, AMYLASE in the last 168 hours. No results for input(s): AMMONIA in the last 168 hours.  CBC:  Recent Labs Lab 06/02/16 0334 06/03/16 0321 06/04/16 0319 06/05/16 0327 06/07/16 1737 06/07/16 1756  WBC 3.4* 9.4 15.5* 20.1* 24.5*  --   NEUTROABS 2.3 7.8* 13.5* 17.5* 21.6*  --   HGB 9.1* 9.5* 10.3* 10.0* 10.8* 11.6*  HCT 28.4* 29.4* 32.1* 31.2* 34.4* 34.0*  MCV 78.7 77.6* 77.2* 77.4* 78.7  --   PLT 89* 82* 91* 72* 122*  --     Cardiac Enzymes: No results for input(s): CKTOTAL, CKMB, CKMBINDEX, TROPONINI in the last 168 hours.  Lipid Panel: No results for input(s): CHOL, TRIG, HDL, CHOLHDL, VLDL, LDLCALC in the last 168 hours.  CBG:  Recent Labs Lab 06/04/16 2344 06/05/16 0350 06/05/16 0745 06/05/16 1159 06/07/16 2308  GLUCAP 147* 127* 106* 136* 21    Microbiology: Results for orders placed or performed during the hospital encounter of 05/31/16  Culture, blood (routine x 2)     Status: Abnormal   Collection Time: 05/31/16  1:20 PM  Result Value Ref Range Status   Specimen Description BLOOD RIGHT ANTECUBITAL  Final   Special Requests BOTTLES DRAWN AEROBIC ONLY 6CC  Final   Culture  Setup Time   Final    GRAM NEGATIVE RODS AEROBIC BOTTLE ONLY CRITICAL RESULT CALLED TO, READ BACK BY AND VERIFIED WITH: D WOFFORD,PHARMD AT 1253 06/01/16 BY L  BENFIELD    Culture (A)  Final    PSEUDOMONAS AERUGINOSA SUSCEPTIBILITIES PERFORMED ON PREVIOUS CULTURE WITHIN THE LAST 5 DAYS. Performed at [facility]    Report Status 06/03/2016 FINAL  Final  Culture, blood (routine x 2)     Status: Abnormal   Collection Time: 05/31/16  1:32 PM  Result Value Ref Range Status   Specimen Description BLOOD RIGHT  ANTECUBITAL  Final   Special Requests BOTTLES DRAWN AEROBIC ONLY 5CC  Final   Culture  Setup Time   Final    GRAM NEGATIVE RODS AEROBIC BOTTLE ONLY CRITICAL RESULT CALLED TO, READ BACK BY AND VERIFIED WITH: D WOFFORD,PHARMD AT 1253 06/01/16 BY L BENFIELD Performed at [place] (A)  Final   Report Status 06/03/2016 FINAL  Final   Organism ID, Bacteria PSEUDOMONAS AERUGINOSA  Final      Susceptibility   Pseudomonas aeruginosa - MIC*    CEFTAZIDIME 4 SENSITIVE Sensitive     CIPROFLOXACIN <=0.25 SENSITIVE Sensitive     GENTAMICIN 2 SENSITIVE Sensitive     IMIPENEM 2 SENSITIVE Sensitive     PIP/TAZO 16 SENSITIVE Sensitive     CEFEPIME 4 SENSITIVE Sensitive     * PSEUDOMONAS AERUGINOSA  Blood Culture ID Panel (Reflexed)     Status: Abnormal   Collection Time: 05/31/16  1:32 PM  Result Value Ref Range Status   Enterococcus species NOT DETECTED NOT DETECTED Final   Listeria monocytogenes NOT DETECTED NOT DETECTED Final   Staphylococcus species NOT DETECTED NOT DETECTED Final   Staphylococcus aureus NOT DETECTED NOT DETECTED Final   Streptococcus species NOT DETECTED NOT DETECTED Final   Streptococcus agalactiae NOT DETECTED NOT DETECTED Final   Streptococcus pneumoniae NOT DETECTED NOT DETECTED Final   Streptococcus pyogenes NOT DETECTED NOT DETECTED Final   Acinetobacter baumannii NOT DETECTED NOT DETECTED Final   Enterobacteriaceae species NOT DETECTED NOT DETECTED Final   Enterobacter cloacae complex NOT DETECTED NOT DETECTED Final   Escherichia coli NOT DETECTED NOT DETECTED Final    Klebsiella oxytoca NOT DETECTED NOT DETECTED Final   Klebsiella pneumoniae NOT DETECTED NOT DETECTED Final   Proteus species NOT DETECTED NOT DETECTED Final   Serratia marcescens NOT DETECTED NOT DETECTED Final   Carbapenem resistance NOT DETECTED NOT DETECTED Final   Haemophilus influenzae NOT DETECTED NOT DETECTED Final   Neisseria meningitidis NOT DETECTED NOT DETECTED Final   Pseudomonas aeruginosa DETECTED (A) NOT DETECTED Final    Comment: CRITICAL RESULT CALLED TO, READ BACK BY AND VERIFIED WITH: D WOFFORD,PHARMD AT 1253 06/01/16 BY L BENFIELD    Candida albicans NOT DETECTED NOT DETECTED Final   Candida glabrata NOT DETECTED NOT DETECTED Final   Candida krusei NOT DETECTED NOT DETECTED Final   Candida parapsilosis NOT DETECTED NOT DETECTED Final   Candida tropicalis NOT DETECTED NOT DETECTED Final    Comment: Performed at [facility]  MRSA PCR Screening     Status: None   Collection Time: 05/31/16  2:34 PM  Result Value Ref Range Status   MRSA by PCR NEGATIVE NEGATIVE Final    Comment:        The GeneXpert MRSA Assay (FDA approved for NASAL specimens only), is one component of a comprehensive MRSA colonization surveillance program. It is not intended to diagnose MRSA infection nor to guide or monitor treatment for MRSA infections.   C difficile quick scan w PCR reflex     Status: None   Collection Time: 05/31/16 11:30 PM  Result Value Ref Range Status   C Diff antigen NEGATIVE NEGATIVE Final   C Diff toxin NEGATIVE NEGATIVE Final   C Diff interpretation No C. difficile detected.  Final  Gastrointestinal Panel by PCR , Stool     Status: None   Collection Time: 05/31/16 11:30 PM  Result Value Ref Range Status   Campylobacter species NOT DETECTED NOT  DETECTED Final   Plesimonas shigelloides NOT DETECTED NOT DETECTED Final   Salmonella species NOT DETECTED NOT DETECTED Final   Yersinia enterocolitica NOT DETECTED NOT DETECTED Final   Vibrio species NOT  DETECTED NOT DETECTED Final   Vibrio cholerae NOT DETECTED NOT DETECTED Final   Enteroaggregative E coli (EAEC) NOT DETECTED NOT DETECTED Final   Enteropathogenic E coli (EPEC) NOT DETECTED NOT DETECTED Final   Enterotoxigenic E coli (ETEC) NOT DETECTED NOT DETECTED Final   Shiga like toxin producing E coli (STEC) NOT DETECTED NOT DETECTED Final   Shigella/Enteroinvasive E coli (EIEC) NOT DETECTED NOT DETECTED Final   Cryptosporidium NOT DETECTED NOT DETECTED Final   Cyclospora cayetanensis NOT DETECTED NOT DETECTED Final   Entamoeba histolytica NOT DETECTED NOT DETECTED Final   Giardia lamblia NOT DETECTED NOT DETECTED Final   Adenovirus F40/41 NOT DETECTED NOT DETECTED Final   Astrovirus NOT DETECTED NOT DETECTED Final   Norovirus GI/GII NOT DETECTED NOT DETECTED Final   Rotavirus A NOT DETECTED NOT DETECTED Final   Sapovirus (I, II, IV, and V) NOT DETECTED NOT DETECTED Final    Coagulation Studies:  Recent Labs  06/05/16 0327 06/07/16 1737  LABPROT 24.4* 35.9*  INR 2.16 3.50    Imaging: Ct Head Wo Contrast  Result Date: 06/07/2016 CLINICAL DATA:  Altered mental status EXAM: CT HEAD WITHOUT CONTRAST TECHNIQUE: Contiguous axial images were obtained from the base of the skull through the vertex without intravenous contrast. COMPARISON:  None. FINDINGS: Brain: No evidence of acute infarction, hemorrhage, extra-axial collection, ventriculomegaly, or mass effect. Periventricular white matter low attenuation likely secondary to microangiopathy. Vascular: Cerebrovascular atherosclerotic calcifications are noted. Skull: Negative for fracture or focal lesion. Sinuses/Orbits: Visualized portions of the orbits are unremarkable. Visualized portions of the paranasal sinuses and mastoid air cells are unremarkable. Other: None. IMPRESSION: No acute intracranial pathology. Electronically Signed   By: Kathreen Devoid   On: 06/07/2016 20:39   Dg Chest Port 1 View  Result Date: 06/07/2016 CLINICAL  DATA:  Left-sided weakness.  Pitting edema. EXAM: PORTABLE CHEST 1 VIEW COMPARISON:  May 31, 2016 FINDINGS: There is a small left effusion with underlying opacity, unchanged. No other interval changes or acute abnormalities. IMPRESSION: Stable left pleural effusion with underlying opacity. No other change. Electronically Signed   By: Dorise Bullion III M.D   On: 06/07/2016 17:55    Assessment: 1. New onset of left hemiparesis, left facial droop and dysarthria. Overall clinical picture most consistent with new right MCA territory stroke. CT shows no acute findings, suggesting possible lacunar infarction as well (CT less sensitive for such). Most likely underlying etiology is her chronic atrial fibrillation.  2. Ovarian CA, on chemotherapy.  3. Malignant ascites with lower extremity edema.  4. Possible auto-anticoagulation due to malignancy (see primary physician's note from prior admission).  5. Thrombocytopenia.   Recommendations: 1. MRI brain with and without contrast to evaluate for stroke and/or metastases. 3. MRA head.  4. Carotid ultrasound. 5. TTE.  6. PT/OT/Speech.  7. Pharmacy dosing Coumadin, which is currently being held due to supratherapeutic INR. 8. Continue Lipitor.  9. Primary team has ordered parameters for BP management. SBP 110 currently. May be challenging to optimize her BP given her tendency to have RVR and her currently relatively low BP, as well as ascites with lower extremity edema, limiting ability to administer IVF.    Electronically signed: Dr. Kerney Elbe 06/07/2016, 11:23 PM

## 2016-06-07 NOTE — ED Triage Notes (Signed)
Pt brought by EMS from [facility] for altered mental status. Staff at facility was unsure when the patient was last seen normal.  Pt has left sided  Weakness. Pitting edema in all 4 extremities. Pt is following commands; speech does not always make sense.

## 2016-06-07 NOTE — Progress Notes (Signed)
Patient  admitted from ED via  [facility].

## 2016-06-07 NOTE — ED Notes (Signed)
EDP informed RN that elevated WBC count is due to previous G-CSF (granulocyte colony stimulating factor) injection.

## 2016-06-07 NOTE — ED Notes (Addendum)
Pt arrived via EMS from nursing facility. EMS noted left sided weakness and facial droop. Left sided weakness in upper extremities more pronounced than in the lower extremities.  Last known well time could not be established. 16 Guage Left EJ IV placed by [facility]; flows well and draws back well, but can be positional at times. Pt has received 543mL bolus since arrival. Pt has notable generalized edema and has hx of A-Fib. Lung sounds clear upon arrival. Pt undergoing tx for cancer and has been on abx for previous infection. Pt has left sided weakness and slurred speech but otherwise is able to communicate effectively. Pt has not c/o pain since arrival. Neuro exams have not changed since established baseline upon arrival. Call [facility] @ [phone] with any questions. Family has been notified of in-patient room number.

## 2016-06-07 NOTE — ED Provider Notes (Signed)
[facility] DEPT Provider Note   CSN: [id number] Arrival date & time: 06/07/16  1721     History   Chief Complaint Chief Complaint  Patient presents with  . Altered Mental Status    HPI Yolanda James is a 82 y.o. female.  The history is provided by the EMS personnel, the nursing home and a relative. The history is limited by the condition of the patient. No language interpreter was used.    82 year old female with extensive past medical history including A. fib, dementia, type 2 diabetes, and history of recurrent delirium due to UTIs who presents with left-sided weakness, left-sided facial droop, left-sided neglect, and generalized mental status change. Per report from the nursing home to EMS, last normal was unknown. She was reportedly normal yesterday. Throughout the day, she has been more drowsy and less arousable than usual and has not been using her left arm or leg. At baseline, patient is reportedly conversant and alert. Currently, she states she feels "bad" but is unable to provide further history.  Level 5 caveat invoked as remainder of history, ROS, and physical exam limited due to patient's dementia.   Past Medical History:  Diagnosis Date  . Acute blood loss anemia   . Chronic a-fib ([place])    a. DCCV 02/05/2007. b. sotalol stopped 2012. c. managed with rate control.  . Chronic diastolic CHF (congestive heart failure) (HCC)    associated with AFIB RVR  . Essential hypertension   . Glaucoma   . Hypokalemia   . Long term current use of anticoagulant therapy   . Osteoarthritis    hands and knees, back & knees & "all over"   . Pure hypercholesterolemia   . Slow transit constipation   . Type 2 diabetes mellitus without complication, with long-term current use of insulin [facility])     Patient Active Problem List   Diagnosis Date Noted  . Left-sided weakness 06/07/2016  . Facial droop 06/07/2016  . Sepsis ([place]) 06/03/2016  . Bacteremia due to Pseudomonas 06/03/2016   . Pancytopenia, acquired ([place])   . Bloody diarrhea 05/31/2016  . Gastrointestinal bleed 05/31/2016  . Drug-induced neutropenia ([place]) 05/31/2016  . Diarrhea 05/28/2016  . Hypomagnesemia 05/28/2016  . Ovarian cancer ([place]) 05/15/2016  . Ascites 05/15/2016  . Carcinomatosis ([place]) 05/15/2016  . Bilateral lower extremity edema 11/26/2015  . Arthritis of right hip 09/03/2015  . Primary osteoarthritis of right hip 09/02/2015  . Essential hypertension, benign   . Atrial fibrillation with RVR ([place])   . Pure hypercholesterolemia   . Encounter for therapeutic drug monitoring 08/18/2013  . Chronic atrial fibrillation ([place]) 05/26/2013    Past Surgical History:  Procedure Laterality Date  . EYE SURGERY     laser & cataracts removed & IOL both eyes, glaucoma surgery   . GANGLION CYST EXCISION Left 1965   wrist  . TOTAL HIP ARTHROPLASTY Right 09/03/2015   Procedure: TOTAL HIP ARTHROPLASTY;  Surgeon: Frederik Pear, MD;  Location: [facility];  Service: Orthopedics;  Laterality: Right;  . vaginal births     x3    OB History    No data available       Home Medications    Prior to Admission medications   Medication Sig Start Date End Date Taking? Authorizing Provider  atorvastatin (LIPITOR) 80 MG tablet TAKE 1 TABLET(80 MG) BY MOUTH DAILY Patient taking differently: TAKE 1 TABLET(80 MG) BY MOUTH DAILY IN EVENING 05/06/16  Yes Jettie Booze, MD  ciprofloxacin (CIPRO) 750 MG tablet  Take 1 tablet (750 mg total) by mouth 2 (two) times daily. 06/05/16 06/10/16 Yes Patrecia Pour, MD  Coenzyme Q10 (CO Q 10) 100 MG CAPS Take 1 capsule by mouth daily.   Yes Historical Provider, MD  diltiazem (CARDIZEM CD) 240 MG 24 hr capsule Take 1 capsule (240 mg total) by mouth daily. 11/26/15  Yes Brittainy Erie Noe, PA-C  dorzolamide (TRUSOPT) 2 % ophthalmic solution Place 1 drop into both eyes 2 (two) times daily.  03/11/16  Yes Historical Provider, MD  furosemide (LASIX) 40 MG tablet Take 1 tablet daily. May take  additional dose once weekly for swelling. 02/05/16  Yes Jettie Booze, MD  glipiZIDE (GLUCOTROL XL) 2.5 MG 24 hr tablet Take 2.5 mg by mouth 2 (two) times daily.  12/13/13  Yes Historical Provider, MD  LUMIGAN 0.01 % SOLN Place 1 drop into both eyes at bedtime.  09/28/13  Yes Historical Provider, MD  metFORMIN (GLUCOPHAGE) 500 MG tablet Take 500 mg by mouth 2 (two) times daily with a meal.  12/10/13  Yes Historical Provider, MD  metoprolol tartrate (LOPRESSOR) 50 MG tablet Take 1 tablet (50 mg total) by mouth 2 (two) times daily. 06/05/16 09/03/16 Yes Patrecia Pour, MD  Omega-3 Fatty Acids (FISH OIL) 1000 MG CAPS Take 1,000 mg by mouth 2 (two) times daily.   Yes Historical Provider, MD  ondansetron (ZOFRAN) 8 MG tablet Take 1 tablet (8 mg total) by mouth every 8 (eight) hours as needed for nausea or vomiting. 05/22/16  Yes Ladell Pier, MD  pilocarpine (PILOCAR) 4 % ophthalmic solution Place 1 drop into both eyes 4 (four) times daily.  11/23/13  Yes Historical Provider, MD  polyethylene glycol (MIRALAX / GLYCOLAX) packet Take 17 g by mouth daily as needed.    Yes Historical Provider, MD  potassium chloride (MICRO-K) 10 MEQ CR capsule Take 10 mEq by mouth daily.  12/01/15  Yes Historical Provider, MD  timolol (TIMOPTIC) 0.5 % ophthalmic solution Place 1 drop into both eyes 2 (two) times daily.  03/13/16  Yes Historical Provider, MD  warfarin (COUMADIN) 3 MG tablet Take 3 mg by mouth daily.    Historical Provider, MD    Family History Family History  Problem Relation Age of Onset  . Arrhythmia Sister   . Heart attack Sister     Social History Social History  Substance Use Topics  . Smoking status: Former Research scientist (life sciences)  . Smokeless tobacco: Never Used  . Alcohol use No     Allergies   Patient has no known allergies.   Review of Systems Review of Systems  Unable to perform ROS: Dementia  Constitutional: Positive for chills and fatigue.  Neurological: Positive for weakness.     Physical  Exam Updated Vital Signs BP (!) 109/53 (BP Location: Right Arm)   Pulse 80   Temp 98.1 F (36.7 C) (Oral)   Resp 16   Ht 5\' 6"  (1.676 m)   Wt 241 lb 9.6 oz (109.6 kg)   SpO2 98%   BMI 39.00 kg/m   Physical Exam  Constitutional: She appears well-developed. No distress.  HENT:  Head: Normocephalic and atraumatic.  Eyes: Conjunctivae are normal.  Neck: Neck supple.  Cardiovascular: Normal rate, regular rhythm and normal heart sounds.  Exam reveals no friction rub.   No murmur heard. Pulmonary/Chest: Effort normal and breath sounds normal. No respiratory distress. She has no wheezes. She has no rales.  Abdominal: She exhibits no distension.  Musculoskeletal: She exhibits no  edema.  Skin: Skin is warm. Capillary refill takes less than 2 seconds.  Psychiatric: She has a normal mood and affect.  Nursing note and vitals reviewed.   Neurological Exam:  Mental Status: Drowsy, oriented to person only. Speech delayed and mildly slurred. Cranial Nerves: EOMI and PERRLA. No nystagmus noted. Facial sensation intact at forehead, maxillary cheek, and chin/mandible bilaterally. No weakness of masticatory muscles. No facial asymmetry or weakness. Hearing grossly normal to finer rub. Uvula is midline, and palate elevates symmetrically. Normal SCM and trapezius strength. Tongue midline without fasciculations Motor: Muscle strength 5/5 in proximal and distal right upper and right lower extremity. Strength 3 out of 5 in left upper extremity and left lower externally. Positive pronator drift on the left. Reflexes: 2+ and symmetrical in all four extremities.  Sensation: Difficult to assess due to pt confusion Gait: Deferred Coordination: Unable to participate   ED Treatments / Results  Labs (all labs ordered are listed, but only abnormal results are displayed) Labs Reviewed  CBC WITH DIFFERENTIAL/PLATELET - Abnormal; Notable for the following:       Result Value   WBC 24.5 (*)    Hemoglobin 10.8  (*)    HCT 34.4 (*)    MCH 24.7 (*)    RDW 17.5 (*)    Platelets 122 (*)    Neutro Abs 21.6 (*)    Monocytes Absolute 2.2 (*)    All other components within normal limits  COMPREHENSIVE METABOLIC PANEL - Abnormal; Notable for the following:    Potassium 3.1 (*)    Calcium 8.1 (*)    Total Protein 5.1 (*)    Albumin 2.1 (*)    Alkaline Phosphatase 133 (*)    All other components within normal limits  PROTIME-INR - Abnormal; Notable for the following:    Prothrombin Time 35.9 (*)    All other components within normal limits  BRAIN NATRIURETIC PEPTIDE - Abnormal; Notable for the following:    B Natriuretic Peptide 369.6 (*)    All other components within normal limits  URINALYSIS, ROUTINE W REFLEX MICROSCOPIC (NOT AT [facility]) - Abnormal; Notable for the following:    APPearance HAZY (*)    Protein, ur 30 (*)    All other components within normal limits  URINE MICROSCOPIC-ADD ON - Abnormal; Notable for the following:    Squamous Epithelial / LPF 0-5 (*)    Bacteria, UA RARE (*)    Casts HYALINE CASTS (*)    All other components within normal limits  I-STAT CHEM 8, ED - Abnormal; Notable for the following:    Potassium 3.3 (*)    Calcium, Ion 1.09 (*)    Hemoglobin 11.6 (*)    HCT 34.0 (*)    All other components within normal limits  I-STAT VENOUS BLOOD GAS, ED - Abnormal; Notable for the following:    pCO2, Ven 36.1 (*)    pO2, Ven 67.0 (*)    Acid-base deficit 3.0 (*)    All other components within normal limits  CULTURE, BLOOD (ROUTINE X 2)  CULTURE, BLOOD (ROUTINE X 2)  GLUCOSE, CAPILLARY  PROTIME-INR  HEMOGLOBIN A1C  LIPID PANEL  I-STAT CG4 LACTIC ACID, ED  I-STAT TROPOININ, ED  I-STAT CG4 LACTIC ACID, ED    EKG  EKG Interpretation  Date/Time:  Saturday June 07 2016 17:32:25 EST Ventricular Rate:  94 PR Interval:    QRS Duration: 122 QT Interval:  362 QTC Calculation: 453 R Axis:   -36 Text Interpretation:  Atrial  fibrillation Ventricular premature  complex Left bundle branch block No significant change since last tracing Confirmed by Laresa Oshiro MD, Lysbeth Galas [phone]) on 06/07/2016 5:57:31 PM       Radiology Ct Head Wo Contrast  Result Date: 06/07/2016 CLINICAL DATA:  Altered mental status EXAM: CT HEAD WITHOUT CONTRAST TECHNIQUE: Contiguous axial images were obtained from the base of the skull through the vertex without intravenous contrast. COMPARISON:  None. FINDINGS: Brain: No evidence of acute infarction, hemorrhage, extra-axial collection, ventriculomegaly, or mass effect. Periventricular white matter low attenuation likely secondary to microangiopathy. Vascular: Cerebrovascular atherosclerotic calcifications are noted. Skull: Negative for fracture or focal lesion. Sinuses/Orbits: Visualized portions of the orbits are unremarkable. Visualized portions of the paranasal sinuses and mastoid air cells are unremarkable. Other: None. IMPRESSION: No acute intracranial pathology. Electronically Signed   By: Kathreen Devoid   On: 06/07/2016 20:39   Dg Chest Port 1 View  Result Date: 06/07/2016 CLINICAL DATA:  Left-sided weakness.  Pitting edema. EXAM: PORTABLE CHEST 1 VIEW COMPARISON:  May 31, 2016 FINDINGS: There is a small left effusion with underlying opacity, unchanged. No other interval changes or acute abnormalities. IMPRESSION: Stable left pleural effusion with underlying opacity. No other change. Electronically Signed   By: Dorise Bullion III M.D   On: 06/07/2016 17:55    Procedures Procedures (including critical care time)  Medications Ordered in ED Medications  ciprofloxacin (CIPRO) tablet 750 mg (750 mg Oral Not Given 06/07/16 2315)  metoprolol tartrate (LOPRESSOR) tablet 25 mg (25 mg Oral Not Given 06/07/16 2315)  atorvastatin (LIPITOR) tablet 80 mg (not administered)  dorzolamide (TRUSOPT) 2 % ophthalmic solution 1 drop (not administered)  timolol (TIMOPTIC) 0.5 % ophthalmic solution 1 drop (not administered)  furosemide (LASIX)  tablet 40 mg (not administered)  potassium chloride SA (K-DUR,KLOR-CON) CR tablet 20 mEq (not administered)  diltiazem (CARDIZEM CD) 24 hr capsule 240 mg (not administered)  polyethylene glycol (MIRALAX / GLYCOLAX) packet 17 g (not administered)  latanoprost (XALATAN) 0.005 % ophthalmic solution 1 drop (not administered)  insulin aspart (novoLOG) injection 0-9 Units (not administered)  insulin aspart (novoLOG) injection 0-5 Units (not administered)   stroke: mapping our early stages of recovery book (not administered)  Warfarin - Pharmacist Dosing Inpatient (0 each Does not apply Hold 06/07/16 2217)  sodium chloride 0.9 % bolus 500 mL (0 mLs Intravenous Stopped 06/07/16 1834)     Initial Impression / Assessment and Plan / ED Course  I have reviewed the triage vital signs and the nursing notes.  Pertinent labs & imaging results that were available during my care of the patient were reviewed by me and considered in my medical decision making (see chart for details).  Clinical Course     82 year old female with extensive past medical history as above who presents with left facial droop, left upper and lower extremity weakness, and confusion. Last known normal was last night. On arrival, neurological exam shows left-sided deficits as above. She is protecting her airway but confused. Initial lab work is overall unremarkable with no apparent infectious etiology. Urinalysis shows no evidence of UTI. Chest x-ray shows no acute pneumonia. Of note, she has marked leukocytosis but just underwent G-CSF injection so this is expected. No fevers on rectal temperature. CT head is negative. At this time, concern for acute, likely ischemic CVA. She is well outside the window of TPA. Discussed with neurology and will admit to the hospitalist service for further workup, MRI, and monitoring.  Final Clinical Impressions(s) / ED Diagnoses  Final diagnoses:  Altered mental state  Leukocytosis, unspecified type    Left-sided weakness    New Prescriptions Current Discharge Medication List       Duffy Bruce, MD 06/07/16 [phone]

## 2016-06-07 NOTE — Progress Notes (Signed)
ANTICOAGULATION CONSULT NOTE - Initial Consult  Pharmacy Consult for warfarin Indication: atrial fibrillation  No Known Allergies  Patient Measurements:     Vital Signs: Temp: 98.4 F (36.9 C) (11/18 1937) Temp Source: Rectal (11/18 1836) BP: 100/63 (11/18 2115) Pulse Rate: 108 (11/18 1815)  Labs:  Recent Labs  06/05/16 0327 06/07/16 1737 06/07/16 1756  HGB 10.0* 10.8* 11.6*  HCT 31.2* 34.4* 34.0*  PLT 72* 122*  --   LABPROT 24.4* 35.9*  --   INR 2.16 3.50  --   CREATININE 0.73 0.77 0.80    Estimated Creatinine Clearance: 66 mL/min (by C-G formula based on SCr of 0.8 mg/dL).   Medical History: Past Medical History:  Diagnosis Date  . Acute blood loss anemia   . Chronic a-fib ([place])    a. DCCV 02/05/2007. b. sotalol stopped 2012. c. managed with rate control.  . Chronic diastolic CHF (congestive heart failure) (HCC)    associated with AFIB RVR  . Essential hypertension   . Glaucoma   . Hypokalemia   . Long term current use of anticoagulant therapy   . Osteoarthritis    hands and knees, back & knees & "all over"   . Pure hypercholesterolemia   . Slow transit constipation   . Type 2 diabetes mellitus without complication, with long-term current use of insulin (HCC)     Assessment: 33 YOF here with AMS. On warfarin PTA for Afib- PTA dose was 30m daily as reported by patient, however she stated it is currently on hold with no other anticoagulant in place. Admit INR elevated at 3.5. Alk phos is elevated, other LFTs normal. Last outpatient anticoagulation visit was 10/16 at which time her INR was elevated at 5.5 and she was instructed to skip doses on 10/16 and 10/17, then ressume at dose of 528mdaily except 2.43m38mn MWF. There is a telephone note on 10/23 instructing patient to hold warfarin for 2-3 days prior to biopsy at cancer center and then resume warfarin the evening after biopsy. She was admitted 11/11 and then discharged on 11/16. Her INR on discharge was 2.16  and d/c summary states to hold warfarin. She has been on ciprofloxacin which can potentiate INR.  Hgb 10.8, plts 122- no bleeding noted.  Goal of Therapy:  INR 2-3 Monitor platelets by anticoagulation protocol: Yes   Plan: -hold warfarin tonight -daily INR -watch for s/s bleeding -[facility]  Lyell Clugston D. Emmett Bracknell, PharmD, BCPS Clinical Pharmacist Pager: [phone]/18/2017 9:53 PM

## 2016-06-07 NOTE — ED Notes (Signed)
Pt transported to CT ?

## 2016-06-07 NOTE — ED Notes (Signed)
No blood cultures collected,  Pt enroute to inpatient bed.

## 2016-06-08 ENCOUNTER — Inpatient Hospital Stay (HOSPITAL_COMMUNITY): Payer: Medicare Other

## 2016-06-08 ENCOUNTER — Other Ambulatory Visit: Payer: Self-pay | Admitting: Oncology

## 2016-06-08 DIAGNOSIS — E876 Hypokalemia: Secondary | ICD-10-CM

## 2016-06-08 DIAGNOSIS — I639 Cerebral infarction, unspecified: Secondary | ICD-10-CM

## 2016-06-08 LAB — GLUCOSE, CAPILLARY
GLUCOSE-CAPILLARY: 87 mg/dL (ref 65–99)
GLUCOSE-CAPILLARY: 93 mg/dL (ref 65–99)
Glucose-Capillary: 86 mg/dL (ref 65–99)
Glucose-Capillary: 76 mg/dL (ref 65–99)
Glucose-Capillary: 96 mg/dL (ref 65–99)

## 2016-06-08 LAB — BASIC METABOLIC PANEL
ANION GAP: 8 (ref 5–15)
BUN: 19 mg/dL (ref 6–20)
CALCIUM: 8 mg/dL — AB (ref 8.9–10.3)
CO2: 24 mmol/L (ref 22–32)
Chloride: 109 mmol/L (ref 101–111)
Creatinine, Ser: 0.79 mg/dL (ref 0.44–1.00)
Glucose, Bld: 88 mg/dL (ref 65–99)
POTASSIUM: 3 mmol/L — AB (ref 3.5–5.1)
SODIUM: 141 mmol/L (ref 135–145)

## 2016-06-08 LAB — MAGNESIUM: Magnesium: 1.7 mg/dL (ref 1.7–2.4)

## 2016-06-08 LAB — PROTIME-INR
INR: 3.26
PROTHROMBIN TIME: 33.9 s — AB (ref 11.4–15.2)

## 2016-06-08 LAB — LIPID PANEL
Cholesterol: 110 mg/dL (ref 0–200)
HDL: 44 mg/dL (ref >40)
LDL Cholesterol: 46 mg/dL (ref 0–99)
Total CHOL/HDL Ratio: 2.5 RATIO
Triglycerides: 102 mg/dL (ref <150)
VLDL: 20 mg/dL (ref 0–40)

## 2016-06-08 MED ORDER — METOPROLOL TARTRATE 25 MG PO TABS
25.0000 mg | ORAL_TABLET | Freq: Two times a day (BID) | ORAL | Status: DC
  Administered 2016-06-08 – 2016-06-11 (×6): 25 mg via ORAL
  Filled 2016-06-08 (×7): qty 1

## 2016-06-08 MED ORDER — DILTIAZEM HCL ER COATED BEADS 240 MG PO CP24
240.0000 mg | ORAL_CAPSULE | Freq: Every day | ORAL | Status: DC
  Administered 2016-06-08: 240 mg via ORAL
  Filled 2016-06-08: qty 1

## 2016-06-08 MED ORDER — DILTIAZEM HCL ER COATED BEADS 240 MG PO CP24
240.0000 mg | ORAL_CAPSULE | Freq: Every day | ORAL | Status: DC
  Administered 2016-06-09 – 2016-06-11 (×3): 240 mg via ORAL
  Filled 2016-06-08 (×3): qty 1

## 2016-06-08 MED ORDER — GADOBENATE DIMEGLUMINE 529 MG/ML IV SOLN
20.0000 mL | Freq: Once | INTRAVENOUS | Status: AC | PRN
  Administered 2016-06-08: 20 mL via INTRAVENOUS

## 2016-06-08 MED ORDER — POTASSIUM CHLORIDE 2 MEQ/ML IV SOLN
30.0000 meq | Freq: Once | INTRAVENOUS | Status: AC
  Administered 2016-06-08: 30 meq via INTRAVENOUS
  Filled 2016-06-08: qty 15

## 2016-06-08 NOTE — Progress Notes (Signed)
ANTICOAGULATION CONSULT NOTE - Initial Consult  Pharmacy Consult for warfarin Indication: atrial fibrillation  No Known Allergies  Patient Measurements: Height: _0  (167.6 cm) Weight: 241 lb 9.6 oz (109.6 kg) IBW/kg (Calculated) : 59.3   Vital Signs: Temp: 98 F (36.7 C) (11/19 1046) Temp Source: Oral (11/19 1046) BP: 90/50 (11/19 1046) Pulse Rate: 87 (11/19 1046)  Labs:  Recent Labs  06/07/16 1737 06/07/16 1756 06/08/16 0107 06/08/16 0939  HGB 10.8* 11.6*  --   --   HCT 34.4* 34.0*  --   --   PLT 122*  --   --   --   LABPROT 35.9*  --  33.9*  --   INR 3.50  --  3.26  --   CREATININE 0.77 0.80  --  0.79    Estimated Creatinine Clearance: 66.8 mL/min (by C-G formula based on SCr of 0.79 mg/dL).   Medical History: Past Medical History:  Diagnosis Date  . Acute blood loss anemia   . Chronic a-fib ([place])    a. DCCV 02/05/2007. b. sotalol stopped 2012. c. managed with rate control.  . Chronic diastolic CHF (congestive heart failure) (HCC)    associated with AFIB RVR  . Essential hypertension   . Glaucoma   . Hypokalemia   . Long term current use of anticoagulant therapy   . Osteoarthritis    hands and knees, back & knees & "all over"   . Pure hypercholesterolemia   . Slow transit constipation   . Type 2 diabetes mellitus without complication, with long-term current use of insulin (HCC)     Assessment: 69 YOF here with AMS. On warfarin PTA for Afib- PTA dose was 92m daily as reported by patient [facility] however it was currently on hold with no other anticoagulant in place. Admit INR elevated at 3.5. Alk phos is elevated, other LFTs normal. She was admitted 11/11 and then discharged on 11/16. Her INR on discharge was 2.16 and d/c summary states to hold warfarin.   Patient has been on ciprofloxacin which can increase INR, and this was continued inpatient. No new drug interactions noted.   INR is still supratherapeutic at 3.26 but is  trending down. No CBC today, but hemoglobin has been low/stable and platelets low but trending up. No bleeding noted.   Goal of Therapy:  INR 2-3 Monitor platelets by anticoagulation protocol: Yes   Plan: -hold warfarin again tonight -daily INR -watch for s/s bleeding -[place] medication is in patient's binder  MDemetrius Charity PharmD Acute Care Pharmacy Resident  Pager: [phone]/19/2017

## 2016-06-08 NOTE — Consult Note (Signed)
NEURO HOSPITALIST CONSULT NOTE   Requestig physician: Dr. Eliseo Squires  Reason for Consult: Stroke vs. TIA  History obtained from:  Patient and Chart    HPI:                                                                                                                                          Yolanda James is an 82 y.o. female who presented to the ED with new onset of left facial droop, left hemiparesis and dysarthria. LKN on Friday. She has ovarian CA with malignant ascites, on chemotherapy. Has recent GI bleed due to supratherapeutic INR of > 10 for which she was previously admitted. Also leukopenic and thrombocytopenic. Has recent pseudomonas aeruginosa positive blood cultures for which she was on antibiotics. She has chronic atrial fibrillation, frequently with RVR, and has been anticoagulated with Coumadin, but is thought to also auto-anticoagulate because of her ovarian CA, possibly being the main reason for her successive supratherapeutic INRs. She states that prior to being diagnosed with  Coumadin being dosed by pharmacy this admission and INR supratherapeutic again, so it is being held.   Past Medical History:  Diagnosis Date  . Acute blood loss anemia   . Chronic a-fib ([place])    a. DCCV 02/05/2007. b. sotalol stopped 2012. c. managed with rate control.  . Chronic diastolic CHF (congestive heart failure) (HCC)    associated with AFIB RVR  . Essential hypertension   . Glaucoma   . Hypokalemia   . Long term current use of anticoagulant therapy   . Osteoarthritis    hands and knees, back & knees & "all over"   . Pure hypercholesterolemia   . Slow transit constipation   . Type 2 diabetes mellitus without complication, with long-term current use of insulin [facility])     Past Surgical History:  Procedure Laterality Date  . EYE SURGERY     laser & cataracts removed & IOL both eyes, glaucoma surgery   . GANGLION CYST EXCISION Left 1965   wrist  . TOTAL HIP ARTHROPLASTY  Right 09/03/2015   Procedure: TOTAL HIP ARTHROPLASTY;  Surgeon: Frederik Pear, MD;  Location: [place];  Service: Orthopedics;  Laterality: Right;  . vaginal births     x3    Family History  Problem Relation Age of Onset  . Arrhythmia Sister   . Heart attack Sister     Social History:  reports that she has quit smoking. She has never used smokeless tobacco. She reports that she does not drink alcohol or use drugs.  No Known Allergies  MEDICATIONS:  Current Facility-Administered Medications:  .   stroke: mapping our early stages of recovery book, , Does not apply, Once, Jessica U Vann, DO .  [START ON 06/08/2016] atorvastatin (LIPITOR) tablet 80 mg, 80 mg, Oral, q1800, Geradine Girt, DO .  ciprofloxacin (CIPRO) tablet 750 mg, 750 mg, Oral, BID, Jessica U Vann, DO .  [START ON 06/08/2016] diltiazem (CARDIZEM CD) 24 hr capsule 240 mg, 240 mg, Oral, Daily, Jessica U Vann, DO .  dorzolamide (TRUSOPT) 2 % ophthalmic solution 1 drop, 1 drop, Both Eyes, BID, Jessica U Vann, DO .  [START ON 06/08/2016] furosemide (LASIX) tablet 40 mg, 40 mg, Oral, Daily, Jessica U Vann, DO .  insulin aspart (novoLOG) injection 0-5 Units, 0-5 Units, Subcutaneous, QHS, Jessica U Vann, DO .  [START ON 06/08/2016] insulin aspart (novoLOG) injection 0-9 Units, 0-9 Units, Subcutaneous, TID WC, Jessica U Vann, DO .  latanoprost (XALATAN) 0.005 % ophthalmic solution 1 drop, 1 drop, Both Eyes, QHS, Jessica U Vann, DO .  metoprolol tartrate (LOPRESSOR) tablet 25 mg, 25 mg, Oral, BID, Jessica U Vann, DO .  polyethylene glycol (MIRALAX / GLYCOLAX) packet 17 g, 17 g, Oral, Daily PRN, Geradine Girt, DO .  [START ON 06/08/2016] potassium chloride SA (K-DUR,KLOR-CON) CR tablet 20 mEq, 20 mEq, Oral, Daily, Jessica U Vann, DO .  timolol (TIMOPTIC) 0.5 % ophthalmic solution 1 drop, 1 drop, Both Eyes, BID, Jessica U  Vann, DO .  Warfarin - Pharmacist Dosing Inpatient, , Does not apply, q1800, Almeta Monas Bajbus, RPH, Stopped at 06/07/16 2217   ROS:                                                                                                                                       History obtained from patient. Denies headache, chest pain or limb pain. Endorses limb swelling diffusely as well as left sided weakness. Denies confusion or difficulty speaking.   Blood pressure 107/60, pulse 108, temperature 98.4 F (36.9 C), resp. rate 11, SpO2 95 %.   General Examination:                                                                                                      HEENT-  Normocephalic/atraumatic. Lungs- Respirations unlabored.  Extremities- Pitting edema bilateral lower extremities.   Neurological Examination Mental Status: Alert, oriented to month, city, state but not day. Thought content appropriate. Able to follow all commands and converse. Speech fluent without errors of syntax. Mild dysarthria noted. Naming  intact. Cranial Nerves: II: Visual fields intact to confrontation, PERRL III,IV, VI: ptosis not present, EOMI intact with saccadic visual pursuits noted V,VII: Left facial droop. Facial light touch sensation normal bilaterally VIII: hearing intact to questions and commands IX,X: unable to visualize margins of palate XI: Decreased strength on left XII: midline tongue extension Motor: Right : Upper extremity   4+/5    Left:     Upper extremity   4-/5  Lower extremity   2/5 proximal, 4/5 distal  Lower extremity   2/5 proximal, 4-/5 distal Prominent lower extremity edema limits motor exam Sensory: Temperature sensation equal x 4. Decreased FT sensation LLE and LUE. Positive for left sided extinction.  Deep Tendon Reflexes: Limited by lower extremity edema: 0 patellae and ankles. Upper extremities 1+ bilaterally Plantars: Right: downgoing   Left: downgoing Cerebellar: No ataxia with FNF  bilaterally Gait: Deferred due to falls risk concerns   Lab Results: Basic Metabolic Panel:  Recent Labs Lab 06/02/16 0334 06/03/16 0321 06/04/16 0319 06/05/16 0327 06/07/16 1737 06/07/16 1756  NA 132* 133* 137 140 140 141  K 3.6 3.6 3.1* 3.7 3.1* 3.3*  CL 103 102 103 110 109 106  CO2 24 22 25  21* 24  --   GLUCOSE 125* 165* 129* 134* 85 85  BUN 27* 26* 25* 26* 20 20  CREATININE 0.91 0.78 0.91 0.73 0.77 0.80  CALCIUM 7.3* 7.6* 8.0* 7.9* 8.1*  --     Liver Function Tests:  Recent Labs Lab 06/07/16 1737  AST 29  ALT 27  ALKPHOS 133*  BILITOT 0.7  PROT 5.1*  ALBUMIN 2.1*   No results for input(s): LIPASE, AMYLASE in the last 168 hours. No results for input(s): AMMONIA in the last 168 hours.  CBC:  Recent Labs Lab 06/02/16 0334 06/03/16 0321 06/04/16 0319 06/05/16 0327 06/07/16 1737 06/07/16 1756  WBC 3.4* 9.4 15.5* 20.1* 24.5*  --   NEUTROABS 2.3 7.8* 13.5* 17.5* 21.6*  --   HGB 9.1* 9.5* 10.3* 10.0* 10.8* 11.6*  HCT 28.4* 29.4* 32.1* 31.2* 34.4* 34.0*  MCV 78.7 77.6* 77.2* 77.4* 78.7  --   PLT 89* 82* 91* 72* 122*  --     Cardiac Enzymes: No results for input(s): CKTOTAL, CKMB, CKMBINDEX, TROPONINI in the last 168 hours.  Lipid Panel: No results for input(s): CHOL, TRIG, HDL, CHOLHDL, VLDL, LDLCALC in the last 168 hours.  CBG:  Recent Labs Lab 06/04/16 2344 06/05/16 0350 06/05/16 0745 06/05/16 1159 06/07/16 2308  GLUCAP 147* 127* 106* 136* 83    Microbiology: Results for orders placed or performed during the hospital encounter of 05/31/16  Culture, blood (routine x 2)     Status: Abnormal   Collection Time: 05/31/16  1:20 PM  Result Value Ref Range Status   Specimen Description BLOOD RIGHT ANTECUBITAL  Final   Special Requests BOTTLES DRAWN AEROBIC ONLY 6CC  Final   Culture  Setup Time   Final    GRAM NEGATIVE RODS AEROBIC BOTTLE ONLY CRITICAL RESULT CALLED TO, READ BACK BY AND VERIFIED WITH: D WOFFORD,PHARMD AT 1253 06/01/16 BY L  BENFIELD    Culture (A)  Final    PSEUDOMONAS AERUGINOSA SUSCEPTIBILITIES PERFORMED ON PREVIOUS CULTURE WITHIN THE LAST 5 DAYS. Performed at [facility]    Report Status 06/03/2016 FINAL  Final  Culture, blood (routine x 2)     Status: Abnormal   Collection Time: 05/31/16  1:32 PM  Result Value Ref Range Status   Specimen Description BLOOD RIGHT  ANTECUBITAL  Final   Special Requests BOTTLES DRAWN AEROBIC ONLY 5CC  Final   Culture  Setup Time   Final    GRAM NEGATIVE RODS AEROBIC BOTTLE ONLY CRITICAL RESULT CALLED TO, READ BACK BY AND VERIFIED WITH: D WOFFORD,PHARMD AT 1253 06/01/16 BY L BENFIELD Performed at [place] (A)  Final   Report Status 06/03/2016 FINAL  Final   Organism ID, Bacteria PSEUDOMONAS AERUGINOSA  Final      Susceptibility   Pseudomonas aeruginosa - MIC*    CEFTAZIDIME 4 SENSITIVE Sensitive     CIPROFLOXACIN <=0.25 SENSITIVE Sensitive     GENTAMICIN 2 SENSITIVE Sensitive     IMIPENEM 2 SENSITIVE Sensitive     PIP/TAZO 16 SENSITIVE Sensitive     CEFEPIME 4 SENSITIVE Sensitive     * PSEUDOMONAS AERUGINOSA  Blood Culture ID Panel (Reflexed)     Status: Abnormal   Collection Time: 05/31/16  1:32 PM  Result Value Ref Range Status   Enterococcus species NOT DETECTED NOT DETECTED Final   Listeria monocytogenes NOT DETECTED NOT DETECTED Final   Staphylococcus species NOT DETECTED NOT DETECTED Final   Staphylococcus aureus NOT DETECTED NOT DETECTED Final   Streptococcus species NOT DETECTED NOT DETECTED Final   Streptococcus agalactiae NOT DETECTED NOT DETECTED Final   Streptococcus pneumoniae NOT DETECTED NOT DETECTED Final   Streptococcus pyogenes NOT DETECTED NOT DETECTED Final   Acinetobacter baumannii NOT DETECTED NOT DETECTED Final   Enterobacteriaceae species NOT DETECTED NOT DETECTED Final   Enterobacter cloacae complex NOT DETECTED NOT DETECTED Final   Escherichia coli NOT DETECTED NOT DETECTED Final    Klebsiella oxytoca NOT DETECTED NOT DETECTED Final   Klebsiella pneumoniae NOT DETECTED NOT DETECTED Final   Proteus species NOT DETECTED NOT DETECTED Final   Serratia marcescens NOT DETECTED NOT DETECTED Final   Carbapenem resistance NOT DETECTED NOT DETECTED Final   Haemophilus influenzae NOT DETECTED NOT DETECTED Final   Neisseria meningitidis NOT DETECTED NOT DETECTED Final   Pseudomonas aeruginosa DETECTED (A) NOT DETECTED Final    Comment: CRITICAL RESULT CALLED TO, READ BACK BY AND VERIFIED WITH: D WOFFORD,PHARMD AT 1253 06/01/16 BY L BENFIELD    Candida albicans NOT DETECTED NOT DETECTED Final   Candida glabrata NOT DETECTED NOT DETECTED Final   Candida krusei NOT DETECTED NOT DETECTED Final   Candida parapsilosis NOT DETECTED NOT DETECTED Final   Candida tropicalis NOT DETECTED NOT DETECTED Final    Comment: Performed at [facility]  MRSA PCR Screening     Status: None   Collection Time: 05/31/16  2:34 PM  Result Value Ref Range Status   MRSA by PCR NEGATIVE NEGATIVE Final    Comment:        The GeneXpert MRSA Assay (FDA approved for NASAL specimens only), is one component of a comprehensive MRSA colonization surveillance program. It is not intended to diagnose MRSA infection nor to guide or monitor treatment for MRSA infections.   C difficile quick scan w PCR reflex     Status: None   Collection Time: 05/31/16 11:30 PM  Result Value Ref Range Status   C Diff antigen NEGATIVE NEGATIVE Final   C Diff toxin NEGATIVE NEGATIVE Final   C Diff interpretation No C. difficile detected.  Final  Gastrointestinal Panel by PCR , Stool     Status: None   Collection Time: 05/31/16 11:30 PM  Result Value Ref Range Status   Campylobacter species NOT DETECTED NOT  DETECTED Final   Plesimonas shigelloides NOT DETECTED NOT DETECTED Final   Salmonella species NOT DETECTED NOT DETECTED Final   Yersinia enterocolitica NOT DETECTED NOT DETECTED Final   Vibrio species NOT  DETECTED NOT DETECTED Final   Vibrio cholerae NOT DETECTED NOT DETECTED Final   Enteroaggregative E coli (EAEC) NOT DETECTED NOT DETECTED Final   Enteropathogenic E coli (EPEC) NOT DETECTED NOT DETECTED Final   Enterotoxigenic E coli (ETEC) NOT DETECTED NOT DETECTED Final   Shiga like toxin producing E coli (STEC) NOT DETECTED NOT DETECTED Final   Shigella/Enteroinvasive E coli (EIEC) NOT DETECTED NOT DETECTED Final   Cryptosporidium NOT DETECTED NOT DETECTED Final   Cyclospora cayetanensis NOT DETECTED NOT DETECTED Final   Entamoeba histolytica NOT DETECTED NOT DETECTED Final   Giardia lamblia NOT DETECTED NOT DETECTED Final   Adenovirus F40/41 NOT DETECTED NOT DETECTED Final   Astrovirus NOT DETECTED NOT DETECTED Final   Norovirus GI/GII NOT DETECTED NOT DETECTED Final   Rotavirus A NOT DETECTED NOT DETECTED Final   Sapovirus (I, II, IV, and V) NOT DETECTED NOT DETECTED Final    Coagulation Studies:  Recent Labs  06/05/16 0327 06/07/16 1737  LABPROT 24.4* 35.9*  INR 2.16 3.50    Imaging: Ct Head Wo Contrast  Result Date: 06/07/2016 CLINICAL DATA:  Altered mental status EXAM: CT HEAD WITHOUT CONTRAST TECHNIQUE: Contiguous axial images were obtained from the base of the skull through the vertex without intravenous contrast. COMPARISON:  None. FINDINGS: Brain: No evidence of acute infarction, hemorrhage, extra-axial collection, ventriculomegaly, or mass effect. Periventricular white matter low attenuation likely secondary to microangiopathy. Vascular: Cerebrovascular atherosclerotic calcifications are noted. Skull: Negative for fracture or focal lesion. Sinuses/Orbits: Visualized portions of the orbits are unremarkable. Visualized portions of the paranasal sinuses and mastoid air cells are unremarkable. Other: None. IMPRESSION: No acute intracranial pathology. Electronically Signed   By: Kathreen Devoid   On: 06/07/2016 20:39   Dg Chest Port 1 View  Result Date: 06/07/2016 CLINICAL  DATA:  Left-sided weakness.  Pitting edema. EXAM: PORTABLE CHEST 1 VIEW COMPARISON:  May 31, 2016 FINDINGS: There is a small left effusion with underlying opacity, unchanged. No other interval changes or acute abnormalities. IMPRESSION: Stable left pleural effusion with underlying opacity. No other change. Electronically Signed   By: Dorise Bullion III M.D   On: 06/07/2016 17:55    Assessment: 1. New onset of left hemiparesis, left facial droop and dysarthria. Overall clinical picture most consistent with new right MCA territory stroke. CT shows no acute findings, suggesting possible lacunar infarction as well (CT less sensitive for such). Most likely underlying etiology is her chronic atrial fibrillation.  2. Ovarian CA, on chemotherapy.  3. Malignant ascites with lower extremity edema.  4. Possible auto-anticoagulation due to malignancy (see primary physician's note from prior admission).  5. Thrombocytopenia.   Recommendations: 1. MRI brain with and without contrast to evaluate for stroke and/or metastases. 3. MRA head.  4. Carotid ultrasound. 5. TTE.  6. PT/OT/Speech.  7. Pharmacy dosing Coumadin, which is currently being held due to supratherapeutic INR. 8. Continue Lipitor.  9. Primary team has ordered parameters for BP management. May be challenging to optimize her BP given her tendency to have RVR, but with currently relatively low BP. Ascites with lower extremity edema presents an additional challenge regarding administration of IVF.      Electronically signed: Dr. Kerney Elbe 06/07/2016, 11:23 PM

## 2016-06-08 NOTE — Progress Notes (Signed)
Assumed care of patient from [name], [place].

## 2016-06-08 NOTE — Progress Notes (Signed)
STROKE TEAM PROGRESS NOTE   HISTORY OF PRESENT ILLNESS (per record) Yolanda James is an 82 y.o. female who presented to the ED with new onset of left facial droop, left hemiparesis and dysarthria. LKN on Friday. She has ovarian CA with malignant ascites, on chemotherapy. Has recent GI bleed due to supratherapeutic INR of > 10 for which she was previously admitted. Also leukopenic and thrombocytopenic. Has recent pseudomonas aeruginosa positive blood cultures for which she was on antibiotics. She has chronic atrial fibrillation, frequently with RVR, and has been anticoagulated with Coumadin, but is thought to also auto-anticoagulate because of her ovarian CA, possibly being the main reason for her successive supratherapeutic INRs. She states that prior to being diagnosed with  Coumadin being dosed by pharmacy this admission and INR supratherapeutic again, so it is being held.    SUBJECTIVE (INTERVAL HISTORY) Multiple family members present. They provide most of the history. The patient is feeling much better today. Dr. Leonie Man discussed changing the patient to a newer anticoagulant. (The patient would prefer once a day dosing) Her INR is still elevated - initiation of a new anticoagulant will have to wait until the INR is less than 2.0. May need to clarify with pharmacy. Dr. Leonie Man reviewed the MRI images with the family members. He explained that the patient's deficits were much less severe than the images might suggest.   OBJECTIVE Temp:  [97.5 F (36.4 C)-98.7 F (37.1 C)] 97.7 F (36.5 C) (11/19 0712) Pulse Rate:  [80-113] 90 (11/19 0712) Cardiac Rhythm: Atrial fibrillation (11/19 0712) Resp:  [8-18] 18 (11/19 0712) BP: (90-144)/(49-86) 144/54 (11/19 0712) SpO2:  [95 %-100 %] 96 % (11/19 0712) Weight:  [109.6 kg (241 lb 9.6 oz)] 109.6 kg (241 lb 9.6 oz) (11/18 2324)  CBC:  Recent Labs Lab 06/05/16 0327 06/07/16 1737 06/07/16 1756  WBC 20.1* 24.5*  --   NEUTROABS 17.5* 21.6*  --   HGB  10.0* 10.8* 11.6*  HCT 31.2* 34.4* 34.0*  MCV 77.4* 78.7  --   PLT 72* 122*  --     Basic Metabolic Panel:  Recent Labs Lab 06/05/16 0327 06/07/16 1737 06/07/16 1756 06/08/16 0042  NA 140 140 141  --   K 3.7 3.1* 3.3*  --   CL 110 109 106  --   CO2 21* 24  --   --   GLUCOSE 134* 85 85  --   BUN 26* 20 20  --   CREATININE 0.73 0.77 0.80  --   CALCIUM 7.9* 8.1*  --   --   MG  --   --   --  1.7    Lipid Panel:    Component Value Date/Time   CHOL 110 06/08/2016 0107   CHOL 136 09/19/2013 0955   TRIG 102 06/08/2016 0107   TRIG 118 09/19/2013 0955   HDL 44 06/08/2016 0107   HDL 51 09/19/2013 0955   CHOLHDL 2.5 06/08/2016 0107   VLDL 20 06/08/2016 0107   LDLCALC 46 06/08/2016 0107   LDLCALC 61 09/19/2013 0955   HgbA1c:  Lab Results  Component Value Date   HGBA1C 5.9 (H) 09/03/2015   Urine Drug Screen: No results found for: LABOPIA, COCAINSCRNUR, LABBENZ, AMPHETMU, THCU, LABBARB    IMAGING  Ct Head Wo Contrast 06/07/2016 No acute intracranial pathology.    Mr Jodene Nam Head/brain Wo Cm 06/08/2016 1. Areas of mild diffusion restriction within the right MCA territory, including the right frontal cortex, right insula and anterior right temporal cortex  as well as the tail of the right caudate nucleus. ADC values are higher than typically seen in the acute setting, but this is still favored to indicate late acute to early subacute ischemic infarct.  2. No hemorrhage or mass effect.    Dg Chest Port 1 View 06/07/2016 Stable left pleural effusion with underlying opacity. No other change.   PHYSICAL EXAM Pleasant elderly lady not in distress. . Afebrile. Head is nontraumatic. Neck is supple without bruit.    Cardiac exam no murmur or gallop. Lungs are clear to auscultation. Distal pulses are well felt.  Neurological Exam :  Awake alert oriented x 3 normal speech and language. Mild left lower face asymmetry. Tongue midline. No drift. Mild diminished fine finger movements  on left. Orbits right over left upper extremity. Mild left grip weak.. Normal sensation . Normal coordination.     ASSESSMENT/PLAN Yolanda James is a 82 y.o. female with history of ovarian cancer (undergoing chemotherapy), malignant ascites, atrial fibrillation on Coumadin with recent supra therapeutic INR (greater than 10 -> treated with IV vitamin K and fresh frozen plasma) associated with some GI bleeding, leukopenia, thrombocytopenia, recent positive blood cultures (treated), diabetes mellitus, hyperlipidemia, hypertension, and congestive heart failure, presenting with left-sided weakness and dysarthria. She did not receive IV t-PA due to Coumadin therapy.  Stroke:  Non-dominant infarct - embolic secondary to atrial fibrillation.  Resultant  Mild left hemiparesis  MRI - areas of mild diffusion restriction within the right MCA territory.  MRA - normal  Carotid Doppler - pending  2D Echo - pending  LDL - 46  HgbA1c - pending  VTE prophylaxis - warfarin  Diet Heart Room service appropriate? Yes; Fluid consistency: Thin  warfarin daily prior to admission, now on warfarin daily  Patient counseled to be compliant with her antithrombotic medications  Ongoing aggressive stroke risk factor management  Therapy recommendations: pending  Disposition: Pending  Hypertension  Blood pressure tends to run low ( on diltiazem and metoprolol - rate control atrial fibrillation )  Permissive hypertension (OK if < 220/120) but gradually normalize in 5-7 days  Long-term BP goal normotensive  Hyperlipidemia  Home meds: Lipitor 80 mg daily resumed in hospital  LDL 46, goal < 70  Continue statin at discharge  Diabetes  HgbA1c pending, goal < 7.0  Controlled  Other Stroke Risk Factors  Advanced age  Former cigarette smoker - quit  Obesity, Body mass index is 39 kg/m., recommend weight loss, diet and exercise as appropriate   Atrial fibrillation  Other Active  Problems  Hypokalemia - supplemented - 3.3  Mild anemia - 11.6 / 34  Leukocytosis - 9.4 -> 15.5 -> 20.1 -> 24.5 (afebrile)  Thrombocytopenia - 82 -> 91 -> 72 -> [facility] day # 1  Mikey Bussing PA-C [facility] Pager [phone] 06/08/2016, 2:00 PM I have personally examined this patient, reviewed notes, independently viewed imaging studies, participated in medical decision making and plan of care.ROS completed by me personally and pertinent positives fully documented  I have made any additions or clarifications directly to the above note. Agree with note above. She has presented with embolic right MCA branch infarct secondary to AFIB despite being autoanticoagulated  And being off warfarin due to elevated INR 10. He may benefit with considering chnaging to [place] in future after d/y Oncology when INR falls below 2.Lond d/w patient and multiple family members at bedside.Patient doing lot better than scan .I spent a total of  35 minutes in face to face in clinical consultation, greater than 50% of which was counseling/coordinating care about stroke risk and prevention.D/W Dr Carolin Sicks   Antony Contras, MD Medical Director [place] Pager: [phone] 06/08/2016 5:45 PM   To contact Stroke Continuity provider, please refer to [email or web address] After hours, contact General Neurology

## 2016-06-08 NOTE — Evaluation (Signed)
Physical Therapy Evaluation Patient Details Name: Yolanda James MRN: [id number] DOB: 12-24-1930 Today's Date: 06/08/2016   History of Present Illness  82 y.o.femalewith medical history significant ofovarian CA on chemotherapy, A. Fib on chronic coumadin presenting with slurred speech, facial droop, and L arm drift. 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    Clinical Impression  Patient presents with problems listed below.  Will benefit from acute PT to maximize functional mobility prior to discharge back to SNF for continued therapy.    Follow Up Recommendations SNF;Supervision/Assistance - 24 hour    Equipment Recommendations  None recommended by PT    Recommendations for Other Services       Precautions / Restrictions Precautions Precautions: Fall Restrictions Weight Bearing Restrictions: No      Mobility  Bed Mobility Overal bed mobility: Needs Assistance Bed Mobility: Rolling Rolling: Max assist         General bed mobility comments: Patient requiring max assist for rolling.  Can initiate movement with UE's reaching for rail.  Transfers                 General transfer comment: Did not assess  Ambulation/Gait                Stairs            Wheelchair Mobility    Modified Rankin (Stroke Patients Only) Modified Rankin (Stroke Patients Only) Pre-Morbid Rankin Score: Moderately severe disability Modified Rankin: Severe disability     Balance                                             Pertinent Vitals/Pain Pain Assessment: No/denies pain    Home Living Family/patient expects to be discharged to:: Skilled nursing facility Living Arrangements: Alone Available Help at Discharge: Family;Available PRN/intermittently Type of Home: House Home Access: Level entry     Home  Layout: One level Home Equipment: Walker - 2 wheels;Bedside commode;Grab bars - tub/shower;Grab bars - toilet;Shower seat Additional Comments: Has been at [facility] the past few days; plan to return to SNF upon d/c.   However patient reports she wants to go home.    Prior Function Level of Independence: Needs assistance   Gait / Transfers Assistance Needed: Since admission to SNF a few days ago; pt has been transferring to chair/wheelchair with stand pivot, use of RW, and staff assist.  ADL's / Homemaking Assistance Needed: Assist with bathing and dressing at SNF.  Comments: Prior to [facility] admissions, patient was living at home independently, driving.  Has been getting progressively weaker.     Hand Dominance   Dominant Hand: Right    Extremity/Trunk Assessment   Upper Extremity Assessment: Defer to OT evaluation           Lower Extremity Assessment: RLE deficits/detail;LLE deficits/detail RLE Deficits / Details: Strength grossly 3/5.  Edema noted. LLE Deficits / Details: Strength grossly 3-/5.  Edema noted.     Communication   Communication: No difficulties  Cognition Arousal/Alertness: Awake/alert Behavior During Therapy: WFL for tasks assessed/performed;Flat affect Overall Cognitive Status: Within Functional Limits for tasks assessed                      General  Comments      Exercises General Exercises - Lower Extremity Ankle Circles/Pumps: AROM;Both;15 reps;Supine   Assessment/Plan    PT Assessment Patient needs continued PT services  PT Problem List Decreased strength;Decreased activity tolerance;Decreased balance;Decreased mobility;Decreased knowledge of use of DME;Obesity          PT Treatment Interventions Functional mobility training;Therapeutic activities;Therapeutic exercise;Balance training;Patient/family education    PT Goals (Current goals can be found in the Care Plan section)  Acute Rehab PT Goals Patient Stated Goal: "I want to be  able to go home" PT Goal Formulation: With patient Time For Goal Achievement: 06/22/16 Potential to Achieve Goals: Fair    Frequency Min 2X/week   Barriers to discharge Decreased caregiver support Patient lived at home alone prior to going to SNF.  Patient reports no one available to assist her at home.    Co-evaluation               End of Session   Activity Tolerance: Patient limited by fatigue Patient left: in bed;with call bell/phone within reach Nurse Communication: Need for lift equipment;Mobility status         Time: [id number] PT Time Calculation (min) (ACUTE ONLY): 14 min   Charges:   PT Evaluation $PT Eval Moderate Complexity: 1 Procedure     PT G Codes:        Despina Pole 06-24-2016, 7:18 PM Carita Pian. Sanjuana Kava, [place] Pager [phone]

## 2016-06-08 NOTE — Progress Notes (Signed)
Patient was admitted through the ED via EMS per ED RN note,  Alert oriented and answer questions appropriately.  Noted Left facial droop with slight slurred speech. Per ED RN pt failed swallow evuation because pt was not able to lick top lip.Pt able to perform this with no problem  tongue .Neurologist on call came immediately to assess pt upon arrival on the unit. CBG 73  Pt c/o thirst and dry mouth.The Neuro MD Lindzen discuss it with the Admitting MD DR J. Eliseo Squires  since [place] may not be in house until Monday. Per verbal order R N swallow screen was repeated. Pt pass with no problem. Po medication and snack given.

## 2016-06-08 NOTE — Progress Notes (Signed)
Below is most recent assessment on 11/14 with patient being placed into ST SNF:  [facility] [place]. No changes in current psycho-social and patient was readmitted 2 days after she discharged. Son and daughter are main contacts.   Plan will be to return to SNF at [place] once medically stable. FL2 updated.  Unit CSW to follow.     Clinical Social Work Assessment  Patient Details  Name: Yolanda James MRN: [id number] Date of Birth: Aug 29, 1930  Date of referral:  06/03/16               Reason for consult:  Facility Placement, Discharge Planning                    Permission sought to share information with:  Family Supports, Customer service manager, Case Manager Permission granted to share information::  Yes, Verbal Permission Granted             Name::      Counselling psychologist )             Agency::   (SNF's )             Relationship::   (Daughter )             Contact Information:   [phone])  Housing/Transportation Living arrangements for the past 2 months:  Single Family Home Source of Information:  Patient Patient Interpreter Needed:  None Criminal Activity/Legal Involvement Pertinent to Current Situation/Hospitalization:  No - Comment as needed Significant Relationships:  Adult Children, Other Family Members Lives with:  Self Do you feel safe going back to the place where you live?  No Need for family participation in patient care:  Yes (Comment)  Care giving concerns:  Patient admitted from home alone. Home is handicap accessible.    Social Worker assessment / plan:  MSW met with patient and her niece at bedside in reference to post-acute placement for SNF. MSW introduced MSW role and SNF process. MSW also reviewed and provided SNF list. Patient reported she was at [facility] and [facility] in Feb 2017 for STR. Patient is now interested in [facility] as her first option and [facility] as a second. No further concerns reported at  this time. MSW will continue to follow pt and pt's family for continued support and to facilitate pt's dc needs once stable.   Employment status:  Retired Forensic scientist:  Medicare PT Recommendations:  [place] / Referral to community resources:  [place]  Patient/Family's Response to care: Pt a/o x4. Patient agreeable to SNF STR. Pt and niece pleasant and appreciated social work intervention.   Patient/Family's Understanding of and Emotional Response to Diagnosis, Current Treatment, and Prognosis:  Patient and family aware of medical interventions.   Emotional Assessment Appearance:  Appears stated age Attitude/Demeanor/Rapport:   (Pleasant ) Affect (typically observed):  Accepting, Appropriate, Calm Orientation:  Oriented to Situation, Oriented to  Time, Oriented to Place, Oriented to Self Alcohol / Substance use:  Not Applicable Psych involvement (Current and /or in the community):  No (Comment)  Discharge Needs  Concerns to be addressed:  Denies Needs/Concerns at this time Readmission within the last 30 days:  No Current discharge risk:  Dependent with Mobility Barriers to Discharge:  Continued Medical Work up   Glendon Axe A 06/03/2016, 3:01 PM

## 2016-06-08 NOTE — Evaluation (Signed)
Occupational Therapy Evaluation Patient Details Name: Yolanda James MRN: [id number] DOB: 10/16/1930 Today's Date: 06/08/2016    History of Present Illness 82 y.o.femalewith medical history significant ofovarian CA on chemotherapy, A. Fib on chronic coumadin presenting with slurred speech, facial droop, and L arm drift. 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   Clinical Impression   Pt reports she required assist with ADL and performing basic transfers since she has been at [facility] the past few days. Pt able to roll for removal of bed pan with max assist and total assist for peri care. Pt found to have low SpO2 and BP supine in bed (see vital signs flow sheet; RN aware). Pt noted to have bil UE and LE edema; elevated all 4 extremities. Recommending return to SNF upon d/c for continued rehab for increased independence and safety with ADL and functional mobility prior to return home. Pt would benefit from continued skilled OT to address established goals.    Follow Up Recommendations  SNF;Supervision/Assistance - 24 hour    Equipment Recommendations  Other (comment) (TBD at next venue)    Recommendations for Other Services PT consult     Precautions / Restrictions Precautions Precautions: Fall Restrictions Weight Bearing Restrictions: No      Mobility Bed Mobility Overal bed mobility: Needs Assistance Bed Mobility: Rolling Rolling: Max assist         General bed mobility comments: Max assist to roll for removal of bed pan and peri care at bed level. Total assist +2 to scoot up in bed for repositioning.  Transfers                 General transfer comment: Did not assess    Balance                                            ADL Overall ADL's : Needs assistance/impaired     Grooming: Set up;Bed  level               Lower Body Dressing: Total assistance;Bed level Lower Body Dressing Details (indicate cue type and reason): to don socks   Toilet Transfer Details (indicate cue type and reason): Max assist for rolling to get off of bed pan Toileting- Clothing Manipulation and Hygiene: Total assistance;Bed level Toileting - Clothing Manipulation Details (indicate cue type and reason): Total assist for peri care following BM on bed pan.       General ADL Comments: Financial planner with cleaning pt after BM. RN tech took vital signs at end of session; SpO2=89% on RA, low BP (see vitals flow sheet)     Vision Additional Comments: Needs further assessment   Perception     Praxis      Pertinent Vitals/Pain Pain Assessment: No/denies pain     Hand Dominance Right   Extremity/Trunk Assessment Upper Extremity Assessment Upper Extremity Assessment: Generalized weakness;LUE deficits/detail LUE Deficits / Details: Grossly 4-/5. Bil UE edema noted.   Lower Extremity Assessment Lower Extremity Assessment: Defer to PT evaluation       Communication Communication Communication: No difficulties   Cognition Arousal/Alertness: Awake/alert Behavior During Therapy: WFL for tasks assessed/performed Overall Cognitive Status: Within Functional Limits for tasks assessed  General Comments       Exercises       Shoulder Instructions      Home Living Family/patient expects to be discharged to:: Skilled nursing facility                                 Additional Comments: Has been at [facility] the past few days; plan to return to SNF upon d/c      Prior Functioning/Environment Level of Independence: Needs assistance  Gait / Transfers Assistance Needed: Since admission to SNF a few days ago; pt has been transferring to chair/wheelchair with stand pivot, use of RW, and staff assist. ADL's / Homemaking Assistance Needed: Assist  with bathing and dressing at SNF.            OT Problem List: Decreased strength;Decreased range of motion;Decreased activity tolerance;Impaired balance (sitting and/or standing);Decreased safety awareness;Decreased knowledge of use of DME or AE;Decreased knowledge of precautions;Obesity;Impaired UE functional use;Increased edema   OT Treatment/Interventions: Self-care/ADL training;Therapeutic exercise;Energy conservation;DME and/or AE instruction;Therapeutic activities;Patient/family education;Balance training    OT Goals(Current goals can be found in the care plan section) Acute Rehab OT Goals Patient Stated Goal: return to rehab OT Goal Formulation: With patient Time For Goal Achievement: 06/22/16 Potential to Achieve Goals: Good ADL Goals Pt Will Perform Grooming: with min guard assist;sitting Pt Will Perform Upper Body Bathing: with min guard assist;sitting Pt Will Perform Lower Body Bathing: with min assist;sit to/from stand Pt Will Transfer to Toilet: with min assist;stand pivot transfer;bedside commode  OT Frequency: Min 2X/week   Barriers to D/C:            Co-evaluation              End of Session Nurse Communication: Other (comment) (pt with large BM, vitals (RN and Engineer, manufacturing))  Activity Tolerance: Patient tolerated treatment well Patient left: in bed;with call bell/phone within reach;with bed alarm set   Time: [id number] OT Time Calculation (min): 24 min Charges:  OT General Charges $OT Visit: 1 Procedure OT Evaluation $OT Eval Moderate Complexity: 1 Procedure OT Treatments $Self Care/Home Management : 8-22 mins G-Codes:     Binnie Kand M.S., OTR/L Pager: [phone]  06/08/2016, 2:50 PM

## 2016-06-08 NOTE — Progress Notes (Addendum)
PROGRESS NOTE    Yolanda James  [id number] DOB: Sep 01, 1930 DOA: 06/07/2016 PCP: [name], MD   Brief Narrative: 82 year old female with history of ovarian cancer on chemotherapy, atrial fibrillation on chronic Coumadin, recently admitted for GI bleed, Pseudomonas bacteremia currently on oral antibiotics, discharged to rehabilitation presented with slurred speech and facial droop and left-sided weakness. Patient with acute right MCA ischemic infarct. Neurology evaluation ongoing.  Assessment & Plan:  #Acute/subacute right MCA territory ischemic infarction: The patient presented with slurred speech, facial droop and left-sided weakness. The symptoms are improving. Patient has minimal facial droop and has no slurred speech today. Already evaluated by neurology team. I discussed with the stroke team. -Pending echocardiogram and a carotid ultrasound, EEG. -holding lasix. On metoprolol and diltiazem with parameters. -Continue Lipitor, on Coumadin. -PT, OT and social worker evaluation. -Speech and swallow evaluation.  # Chronic atrial fibrillation [facility]): Continue metoprolol, diltiazem with parameters. Pt has tendency to go to RVR. On Coumadin. Monitor INR. No sign of bleeding. Repeat CBC in the morning.  #Hypokalemia: Replete potassium chloride. Repeat lab in the morning.    #Ovarian cancer [facility]): On chemotherapy. Advised outpatient follow-up with oncologist.  #  Bacteremia due to Pseudomonas: Recently discharged with ciprofloxacin. Continue here.   Left-sided weakness   Facial droop  #Type 2 diabetes: Continue to monitor blood sugar level. Continue sliding scale.  #Leukocytosis: On reviewing patient's record patient has chronic leukocytosis. It is likely related with underlying cancer. She might have received Neupogen after chemotherapy. Repeat CBC in the morning.  DVT prophylaxis: Systemic anticoagulation Code Status: Full code Family Communication: Discussed with the  patient's son, daughter, son-in-law, grandson and the presents of patient at bedside. Disposition Plan: Likely discharge to SNF/rehabilitation in 1-2 days.    Consultants:   Neurologist  Procedures: MRI Antimicrobials: Ciprofloxacin  Subjective: Patient was seen and examined at bedside. Patient has no slurred speech. Left upper extremity strength is gradually improving. Denied headache, dizziness, nausea, vomiting, chest pain or shortness of breath. Family members at bedside.   Objective: Vitals:   06/08/16 0900 06/08/16 1046 06/08/16 1100 06/08/16 1115  BP: (!) 144/59 (!) 90/50 (!) 98/51 (!) 107/55  Pulse: (!) 59 87 100 100  Resp:  18 16 15   Temp:  98 F (36.7 C)    TempSrc:  Oral    SpO2:   97% 98%  Weight:      Height:        Intake/Output Summary (Last 24 hours) at 06/08/16 1345 Last data filed at 06/08/16 1300  Gross per 24 hour  Intake             1220 ml  Output                0 ml  Net             1220 ml   Filed Weights   06/07/16 2324  Weight: 109.6 kg (241 lb 9.6 oz)    Examination:  General exam: Appears calm and comfortable Respiratory system: Clear to auscultation. Respiratory effort normal. No wheezing or crackle Cardiovascular system: S1 & S2 heard, RRR.  No pedal edema. Gastrointestinal system: Abdomen is nondistended, soft and nontender. Normal bowel sounds heard. Central nervous system: Alert, awake, sensory exam normal. Extremities: LUE mildly weaker than right side. Skin: No rashes, lesions or ulcers Psychiatry: Judgement and insight appear normal. Mood & affect appropriate.     Data Reviewed: I have personally reviewed following labs and imaging studies  CBC:  Recent Labs Lab 06/02/16 0334 06/03/16 0321 06/04/16 0319 06/05/16 0327 06/07/16 1737 06/07/16 1756  WBC 3.4* 9.4 15.5* 20.1* 24.5*  --   NEUTROABS 2.3 7.8* 13.5* 17.5* 21.6*  --   HGB 9.1* 9.5* 10.3* 10.0* 10.8* 11.6*  HCT 28.4* 29.4* 32.1* 31.2* 34.4* 34.0*  MCV 78.7  77.6* 77.2* 77.4* 78.7  --   PLT 89* 82* 91* 72* 122*  --    Basic Metabolic Panel:  Recent Labs Lab 06/03/16 0321 06/04/16 0319 06/05/16 0327 06/07/16 1737 06/07/16 1756 06/08/16 0042 06/08/16 0939  NA 133* 137 140 140 141  --  141  K 3.6 3.1* 3.7 3.1* 3.3*  --  3.0*  CL 102 103 110 109 106  --  109  CO2 22 25 21* 24  --   --  24  GLUCOSE 165* 129* 134* 85 85  --  88  BUN 26* 25* 26* 20 20  --  19  CREATININE 0.78 0.91 0.73 0.77 0.80  --  0.79  CALCIUM 7.6* 8.0* 7.9* 8.1*  --   --  8.0*  MG  --   --   --   --   --  1.7  --    GFR: Estimated Creatinine Clearance: 66.8 mL/min (by C-G formula based on SCr of 0.79 mg/dL). Liver Function Tests:  Recent Labs Lab 06/07/16 1737  AST 29  ALT 27  ALKPHOS 133*  BILITOT 0.7  PROT 5.1*  ALBUMIN 2.1*   No results for input(s): LIPASE, AMYLASE in the last 168 hours. No results for input(s): AMMONIA in the last 168 hours. Coagulation Profile:  Recent Labs Lab 06/03/16 0321 06/04/16 0319 06/05/16 0327 06/07/16 1737 06/08/16 0107  INR 1.71 1.66 2.16 3.50 3.26   Cardiac Enzymes: No results for input(s): CKTOTAL, CKMB, CKMBINDEX, TROPONINI in the last 168 hours. BNP (last 3 results) No results for input(s): PROBNP in the last 8760 hours. HbA1C: No results for input(s): HGBA1C in the last 72 hours. CBG:  Recent Labs Lab 06/05/16 1159 06/07/16 2308 06/08/16 0216 06/08/16 0632 06/08/16 1053  GLUCAP 136* 73 86 76 87   Lipid Profile:  Recent Labs  06/08/16 0107  CHOL 110  HDL 44  LDLCALC 46  TRIG 102  CHOLHDL 2.5   Thyroid Function Tests: No results for input(s): TSH, T4TOTAL, FREET4, T3FREE, THYROIDAB in the last 72 hours. Anemia Panel: No results for input(s): VITAMINB12, FOLATE, FERRITIN, TIBC, IRON, RETICCTPCT in the last 72 hours. Sepsis Labs:  Recent Labs Lab 06/07/16 1758 06/07/16 2043  LATICACIDVEN 1.37 0.81    Recent Results (from the past 240 hour(s))  Culture, blood (routine x 2)      Status: Abnormal   Collection Time: 05/31/16  1:20 PM  Result Value Ref Range Status   Specimen Description BLOOD RIGHT ANTECUBITAL  Final   Special Requests BOTTLES DRAWN AEROBIC ONLY 6CC  Final   Culture  Setup Time   Final    GRAM NEGATIVE RODS AEROBIC BOTTLE ONLY CRITICAL RESULT CALLED TO, READ BACK BY AND VERIFIED WITH: D WOFFORD,PHARMD AT 1253 06/01/16 BY L BENFIELD    Culture (A)  Final    PSEUDOMONAS AERUGINOSA SUSCEPTIBILITIES PERFORMED ON PREVIOUS CULTURE WITHIN THE LAST 5 DAYS. Performed at [facility]    Report Status 06/03/2016 FINAL  Final  Culture, blood (routine x 2)     Status: Abnormal   Collection Time: 05/31/16  1:32 PM  Result Value Ref Range Status   Specimen Description BLOOD RIGHT  ANTECUBITAL  Final   Special Requests BOTTLES DRAWN AEROBIC ONLY 5CC  Final   Culture  Setup Time   Final    GRAM NEGATIVE RODS AEROBIC BOTTLE ONLY CRITICAL RESULT CALLED TO, READ BACK BY AND VERIFIED WITH: D WOFFORD,PHARMD AT 1253 06/01/16 BY L BENFIELD Performed at [place] (A)  Final   Report Status 06/03/2016 FINAL  Final   Organism ID, Bacteria PSEUDOMONAS AERUGINOSA  Final      Susceptibility   Pseudomonas aeruginosa - MIC*    CEFTAZIDIME 4 SENSITIVE Sensitive     CIPROFLOXACIN <=0.25 SENSITIVE Sensitive     GENTAMICIN 2 SENSITIVE Sensitive     IMIPENEM 2 SENSITIVE Sensitive     PIP/TAZO 16 SENSITIVE Sensitive     CEFEPIME 4 SENSITIVE Sensitive     * PSEUDOMONAS AERUGINOSA  Blood Culture ID Panel (Reflexed)     Status: Abnormal   Collection Time: 05/31/16  1:32 PM  Result Value Ref Range Status   Enterococcus species NOT DETECTED NOT DETECTED Final   Listeria monocytogenes NOT DETECTED NOT DETECTED Final   Staphylococcus species NOT DETECTED NOT DETECTED Final   Staphylococcus aureus NOT DETECTED NOT DETECTED Final   Streptococcus species NOT DETECTED NOT DETECTED Final   Streptococcus agalactiae NOT DETECTED  NOT DETECTED Final   Streptococcus pneumoniae NOT DETECTED NOT DETECTED Final   Streptococcus pyogenes NOT DETECTED NOT DETECTED Final   Acinetobacter baumannii NOT DETECTED NOT DETECTED Final   Enterobacteriaceae species NOT DETECTED NOT DETECTED Final   Enterobacter cloacae complex NOT DETECTED NOT DETECTED Final   Escherichia coli NOT DETECTED NOT DETECTED Final   Klebsiella oxytoca NOT DETECTED NOT DETECTED Final   Klebsiella pneumoniae NOT DETECTED NOT DETECTED Final   Proteus species NOT DETECTED NOT DETECTED Final   Serratia marcescens NOT DETECTED NOT DETECTED Final   Carbapenem resistance NOT DETECTED NOT DETECTED Final   Haemophilus influenzae NOT DETECTED NOT DETECTED Final   Neisseria meningitidis NOT DETECTED NOT DETECTED Final   Pseudomonas aeruginosa DETECTED (A) NOT DETECTED Final    Comment: CRITICAL RESULT CALLED TO, READ BACK BY AND VERIFIED WITH: D WOFFORD,PHARMD AT 1253 06/01/16 BY L BENFIELD    Candida albicans NOT DETECTED NOT DETECTED Final   Candida glabrata NOT DETECTED NOT DETECTED Final   Candida krusei NOT DETECTED NOT DETECTED Final   Candida parapsilosis NOT DETECTED NOT DETECTED Final   Candida tropicalis NOT DETECTED NOT DETECTED Final    Comment: Performed at [facility]  MRSA PCR Screening     Status: None   Collection Time: 05/31/16  2:34 PM  Result Value Ref Range Status   MRSA by PCR NEGATIVE NEGATIVE Final    Comment:        The GeneXpert MRSA Assay (FDA approved for NASAL specimens only), is one component of a comprehensive MRSA colonization surveillance program. It is not intended to diagnose MRSA infection nor to guide or monitor treatment for MRSA infections.   C difficile quick scan w PCR reflex     Status: None   Collection Time: 05/31/16 11:30 PM  Result Value Ref Range Status   C Diff antigen NEGATIVE NEGATIVE Final   C Diff toxin NEGATIVE NEGATIVE Final   C Diff interpretation No C. difficile detected.  Final    Gastrointestinal Panel by PCR , Stool     Status: None   Collection Time: 05/31/16 11:30 PM  Result Value Ref Range Status   Campylobacter species NOT  DETECTED NOT DETECTED Final   Plesimonas shigelloides NOT DETECTED NOT DETECTED Final   Salmonella species NOT DETECTED NOT DETECTED Final   Yersinia enterocolitica NOT DETECTED NOT DETECTED Final   Vibrio species NOT DETECTED NOT DETECTED Final   Vibrio cholerae NOT DETECTED NOT DETECTED Final   Enteroaggregative E coli (EAEC) NOT DETECTED NOT DETECTED Final   Enteropathogenic E coli (EPEC) NOT DETECTED NOT DETECTED Final   Enterotoxigenic E coli (ETEC) NOT DETECTED NOT DETECTED Final   Shiga like toxin producing E coli (STEC) NOT DETECTED NOT DETECTED Final   Shigella/Enteroinvasive E coli (EIEC) NOT DETECTED NOT DETECTED Final   Cryptosporidium NOT DETECTED NOT DETECTED Final   Cyclospora cayetanensis NOT DETECTED NOT DETECTED Final   Entamoeba histolytica NOT DETECTED NOT DETECTED Final   Giardia lamblia NOT DETECTED NOT DETECTED Final   Adenovirus F40/41 NOT DETECTED NOT DETECTED Final   Astrovirus NOT DETECTED NOT DETECTED Final   Norovirus GI/GII NOT DETECTED NOT DETECTED Final   Rotavirus A NOT DETECTED NOT DETECTED Final   Sapovirus (I, II, IV, and V) NOT DETECTED NOT DETECTED Final         Radiology Studies: Ct Head Wo Contrast  Result Date: 06/07/2016 CLINICAL DATA:  Altered mental status EXAM: CT HEAD WITHOUT CONTRAST TECHNIQUE: Contiguous axial images were obtained from the base of the skull through the vertex without intravenous contrast. COMPARISON:  None. FINDINGS: Brain: No evidence of acute infarction, hemorrhage, extra-axial collection, ventriculomegaly, or mass effect. Periventricular white matter low attenuation likely secondary to microangiopathy. Vascular: Cerebrovascular atherosclerotic calcifications are noted. Skull: Negative for fracture or focal lesion. Sinuses/Orbits: Visualized portions of the orbits are  unremarkable. Visualized portions of the paranasal sinuses and mastoid air cells are unremarkable. Other: None. IMPRESSION: No acute intracranial pathology. Electronically Signed   By: Kathreen Devoid   On: 06/07/2016 20:39   Mr Brain W Wo Contrast  Result Date: 06/08/2016 CLINICAL DATA:  Left facial droop, left hemi paresis and dysarthria EXAM: MRI HEAD WITHOUT CONTRAST MRA HEAD WITHOUT CONTRAST TECHNIQUE: Multiplanar, multiecho pulse sequences of the brain and surrounding structures were obtained without intravenous contrast. Angiographic images of the head were obtained using MRA technique without contrast. COMPARISON:  CT head 06/07/2016. FINDINGS: MRI HEAD FINDINGS Brain: There are high values on diffusion-weighted imaging within the tail of the right caudate nucleus and along the lateral right frontal cortex of, insula and anterior right temporal lobe. ADC values are mildly decreased but less so than would be expected in the acute phase. The midline structures are normal. There is associated hyperintense T2 weighted signal in the distribution corresponding to the above-described diffusion abnormalities. No mass lesion or midline shift. No hydrocephalus or extra-axial fluid collection. No age advanced or lobar predominant atrophy. Vascular: Major intracranial arterial and venous sinus flow voids are preserved. No evidence of chronic microhemorrhage or amyloid angiopathy. Skull and upper cervical spine: The visualized skull base, calvarium, upper cervical spine and extracranial soft tissues are normal. Sinuses/Orbits: No fluid levels or advanced mucosal thickening. No mastoid effusion. Normal orbits. MRA HEAD FINDINGS Intracranial internal carotid arteries: Normal. Anterior cerebral arteries: Absence of the right A1 segment is a normal variant. Otherwise normal. Middle cerebral arteries: Normal. Posterior communicating arteries: Present bilaterally. Posterior cerebral arteries: Normal. Basilar artery: Normal.  Vertebral arteries: Codominant. Normal. Superior cerebellar arteries: Normal. Anterior inferior cerebellar arteries: Normal. Posterior inferior cerebellar arteries: Normal. IMPRESSION: 1. Areas of mild diffusion restriction within the right MCA territory, including the right frontal cortex, right insula and  anterior right temporal cortex as well as the tail of the right caudate nucleus. ADC values are higher than typically seen in the acute setting, but this is still favored to indicate late acute to early subacute ischemic infarct. 2. No hemorrhage or mass effect. These results will be called to the ordering clinician or representative by the Radiologist Assistant, and communication documented in the PACS or zVision Dashboard. Electronically Signed   By: Ulyses Jarred M.D.   On: 06/08/2016 03:45   Dg Chest Port 1 View  Result Date: 06/07/2016 CLINICAL DATA:  Left-sided weakness.  Pitting edema. EXAM: PORTABLE CHEST 1 VIEW COMPARISON:  May 31, 2016 FINDINGS: There is a small left effusion with underlying opacity, unchanged. No other interval changes or acute abnormalities. IMPRESSION: Stable left pleural effusion with underlying opacity. No other change. Electronically Signed   By: Dorise Bullion III M.D   On: 06/07/2016 17:55   Mr Jodene Nam Head/brain Wo Cm  Result Date: 06/08/2016 CLINICAL DATA:  Left facial droop, left hemi paresis and dysarthria EXAM: MRI HEAD WITHOUT CONTRAST MRA HEAD WITHOUT CONTRAST TECHNIQUE: Multiplanar, multiecho pulse sequences of the brain and surrounding structures were obtained without intravenous contrast. Angiographic images of the head were obtained using MRA technique without contrast. COMPARISON:  CT head 06/07/2016. FINDINGS: MRI HEAD FINDINGS Brain: There are high values on diffusion-weighted imaging within the tail of the right caudate nucleus and along the lateral right frontal cortex of, insula and anterior right temporal lobe. ADC values are mildly decreased but  less so than would be expected in the acute phase. The midline structures are normal. There is associated hyperintense T2 weighted signal in the distribution corresponding to the above-described diffusion abnormalities. No mass lesion or midline shift. No hydrocephalus or extra-axial fluid collection. No age advanced or lobar predominant atrophy. Vascular: Major intracranial arterial and venous sinus flow voids are preserved. No evidence of chronic microhemorrhage or amyloid angiopathy. Skull and upper cervical spine: The visualized skull base, calvarium, upper cervical spine and extracranial soft tissues are normal. Sinuses/Orbits: No fluid levels or advanced mucosal thickening. No mastoid effusion. Normal orbits. MRA HEAD FINDINGS Intracranial internal carotid arteries: Normal. Anterior cerebral arteries: Absence of the right A1 segment is a normal variant. Otherwise normal. Middle cerebral arteries: Normal. Posterior communicating arteries: Present bilaterally. Posterior cerebral arteries: Normal. Basilar artery: Normal. Vertebral arteries: Codominant. Normal. Superior cerebellar arteries: Normal. Anterior inferior cerebellar arteries: Normal. Posterior inferior cerebellar arteries: Normal. IMPRESSION: 1. Areas of mild diffusion restriction within the right MCA territory, including the right frontal cortex, right insula and anterior right temporal cortex as well as the tail of the right caudate nucleus. ADC values are higher than typically seen in the acute setting, but this is still favored to indicate late acute to early subacute ischemic infarct. 2. No hemorrhage or mass effect. These results will be called to the ordering clinician or representative by the Radiologist Assistant, and communication documented in the PACS or zVision Dashboard. Electronically Signed   By: Ulyses Jarred M.D.   On: 06/08/2016 03:45        Scheduled Meds: .  stroke: mapping our early stages of recovery book   Does not apply  Once  . atorvastatin  80 mg Oral q1800  . ciprofloxacin  750 mg Oral BID  . diltiazem  240 mg Oral Daily  . dorzolamide  1 drop Both Eyes BID  . furosemide  40 mg Oral Daily  . insulin aspart  0-5 Units Subcutaneous QHS  .  insulin aspart  0-9 Units Subcutaneous TID WC  . latanoprost  1 drop Both Eyes QHS  . metoprolol  25 mg Oral BID  . potassium chloride (KCL MULTIRUN) 30 mEq in 265 mL IVPB  30 mEq Intravenous Once  . potassium chloride  20 mEq Oral Daily  . timolol  1 drop Both Eyes BID  . Warfarin - Pharmacist Dosing Inpatient   Does not apply q1800   Continuous Infusions:   LOS: 1 day    Cyera Balboni Tanna Furry, MD [facility] Pager [phone]  If 7PM-7AM, please contact night-coverage [email or web address] Password [email or web address] 06/08/2016, 1:45 PM

## 2016-06-08 NOTE — NC FL2 (Signed)
[place] LEVEL OF CARE SCREENING TOOL     IDENTIFICATION  Patient Name: Yolanda James Birthdate: [birth year] Sex: female Admission Date (Current Location): 06/07/2016  [facility] and [place] Number:  Herbalist and Address:  [facility] [place] [address] [place], [place] [address]      Provider Number: [id number]  Attending Physician Name and Address:  Rosita Fire, MD  Relative Name and Phone Number:       Current Level of Care: Hospital Recommended Level of Care: [place] Prior Approval Number:    Date Approved/Denied:   PASRR Number: [id number]  Discharge Plan: SNF    Current Diagnoses: Patient Active Problem List   Diagnosis Date Noted  . Left-sided weakness 06/07/2016  . Facial droop 06/07/2016  . Sepsis ([place]) 06/03/2016  . Bacteremia due to Pseudomonas 06/03/2016  . Pancytopenia, acquired ([place])   . Bloody diarrhea 05/31/2016  . Gastrointestinal bleed 05/31/2016  . Drug-induced neutropenia ([place]) 05/31/2016  . Diarrhea 05/28/2016  . Hypomagnesemia 05/28/2016  . Ovarian cancer ([place]) 05/15/2016  . Ascites 05/15/2016  . Carcinomatosis ([place]) 05/15/2016  . Bilateral lower extremity edema 11/26/2015  . Arthritis of right hip 09/03/2015  . Primary osteoarthritis of right hip 09/02/2015  . Essential hypertension, benign   . Atrial fibrillation with RVR ([place])   . Pure hypercholesterolemia   . Encounter for therapeutic drug monitoring 08/18/2013  . Chronic atrial fibrillation ([facility]) 05/26/2013    Orientation RESPIRATION BLADDER Height & Weight     Self, Time, Situation, Place  Normal Continent Weight: 241 lb 9.6 oz (109.6 kg) Height:  5\' 6"  (167.6 cm)  BEHAVIORAL SYMPTOMS/MOOD NEUROLOGICAL BOWEL NUTRITION STATUS      Continent Diet (heart healthy)  AMBULATORY STATUS COMMUNICATION OF NEEDS Skin   Extensive Assist Verbally Normal                       Personal Care Assistance Level  of Assistance  Bathing, Feeding, Dressing Bathing Assistance: Limited assistance Feeding assistance: Limited assistance Dressing Assistance: Maximum assistance     Functional Limitations Info  Sight, Hearing, Speech Sight Info: Adequate Hearing Info: Adequate Speech Info: Adequate    SPECIAL CARE FACTORS FREQUENCY  PT (By licensed PT), OT (By licensed OT)     PT Frequency: 3 OT Frequency: 2            Contractures Contractures Info: Not present    Additional Factors Info  Code Status, Allergies Code Status Info: Full Code Allergies Info: NKA           Current Medications (06/08/2016):  This is the current hospital active medication list Current Facility-Administered Medications  Medication Dose Route Frequency Provider Last Rate Last Dose  .  stroke: mapping our early stages of recovery book   Does not apply Once Jessica U Vann, DO      . atorvastatin (LIPITOR) tablet 80 mg  80 mg Oral q1800 Geradine Girt, DO      . ciprofloxacin (CIPRO) tablet 750 mg  750 mg Oral BID Geradine Girt, DO   750 mg at 06/08/16 0831  . diltiazem (CARDIZEM CD) 24 hr capsule 240 mg  240 mg Oral Daily Geradine Girt, DO   240 mg at 06/08/16 [id number]  . dorzolamide (TRUSOPT) 2 % ophthalmic solution 1 drop  1 drop Both Eyes BID Geradine Girt, DO   1 drop at 06/08/16 [phone]  . furosemide (LASIX) tablet  40 mg  40 mg Oral Daily Geradine Girt, DO   40 mg at 06/08/16 [id number]  . insulin aspart (novoLOG) injection 0-5 Units  0-5 Units Subcutaneous QHS Jessica U Vann, DO      . insulin aspart (novoLOG) injection 0-9 Units  0-9 Units Subcutaneous TID WC Jessica U Vann, DO      . latanoprost (XALATAN) 0.005 % ophthalmic solution 1 drop  1 drop Both Eyes QHS Jessica U Vann, DO      . metoprolol tartrate (LOPRESSOR) tablet 25 mg  25 mg Oral BID Geradine Girt, DO   25 mg at 06/08/16 [id number]  . polyethylene glycol (MIRALAX / GLYCOLAX) packet 17 g  17 g Oral Daily PRN Geradine Girt, DO      . potassium chloride SA  (K-DUR,KLOR-CON) CR tablet 20 mEq  20 mEq Oral Daily Geradine Girt, DO   20 mEq at 06/08/16 0831  . timolol (TIMOPTIC) 0.5 % ophthalmic solution 1 drop  1 drop Both Eyes BID Geradine Girt, DO   1 drop at 06/08/16 0834  . Warfarin - Pharmacist Dosing Inpatient   Does not apply q1800 Briscoe Burns, [place] at 06/07/16 2217     Discharge Medications: Please see discharge summary for a list of discharge medications.  Relevant Imaging Results:  Relevant Lab Results:   Additional Information SSN [id number]  [place], [place]

## 2016-06-08 NOTE — Discharge Instructions (Addendum)

## 2016-06-09 ENCOUNTER — Inpatient Hospital Stay (HOSPITAL_COMMUNITY)
Admit: 2016-06-09 | Discharge: 2016-06-09 | Disposition: A | Discharge status: Home / Self Care | Payer: Medicare Other | Attending: Internal Medicine | Admitting: Internal Medicine

## 2016-06-09 ENCOUNTER — Inpatient Hospital Stay (HOSPITAL_COMMUNITY): Payer: Medicare Other

## 2016-06-09 DIAGNOSIS — G9340 Encephalopathy, unspecified: Secondary | ICD-10-CM

## 2016-06-09 DIAGNOSIS — I63411 Cerebral infarction due to embolism of right middle cerebral artery: Secondary | ICD-10-CM

## 2016-06-09 DIAGNOSIS — R140 Abdominal distension (gaseous): Secondary | ICD-10-CM

## 2016-06-09 DIAGNOSIS — I6789 Other cerebrovascular disease: Secondary | ICD-10-CM

## 2016-06-09 LAB — CBC
HCT: 32 % — ABNORMAL LOW (ref 36.0–46.0)
HEMOGLOBIN: 9.9 g/dL — AB (ref 12.0–15.0)
MCH: 25.4 pg — AB (ref 26.0–34.0)
MCHC: 30.9 g/dL (ref 30.0–36.0)
MCV: 82.1 fL (ref 78.0–100.0)
Platelets: 130 10*3/uL — ABNORMAL LOW (ref 150–400)
RBC: 3.9 MIL/uL (ref 3.87–5.11)
RDW: 18.7 % — ABNORMAL HIGH (ref 11.5–15.5)
WBC: 19.5 10*3/uL — ABNORMAL HIGH (ref 4.0–10.5)

## 2016-06-09 LAB — GLUCOSE, CAPILLARY
GLUCOSE-CAPILLARY: 94 mg/dL (ref 65–99)
GLUCOSE-CAPILLARY: 109 mg/dL — AB (ref 65–99)
Glucose-Capillary: 77 mg/dL (ref 65–99)
Glucose-Capillary: 118 mg/dL — ABNORMAL HIGH (ref 65–99)

## 2016-06-09 LAB — BASIC METABOLIC PANEL
Anion gap: 8 (ref 5–15)
BUN: 16 mg/dL (ref 6–20)
CHLORIDE: 111 mmol/L (ref 101–111)
CO2: 23 mmol/L (ref 22–32)
CREATININE: 0.67 mg/dL (ref 0.44–1.00)
Calcium: 7.8 mg/dL — ABNORMAL LOW (ref 8.9–10.3)
Glucose, Bld: 85 mg/dL (ref 65–99)
POTASSIUM: 3.3 mmol/L — AB (ref 3.5–5.1)
SODIUM: 142 mmol/L (ref 135–145)

## 2016-06-09 LAB — HEMOGLOBIN A1C
Hgb A1c MFr Bld: 6.1 % — ABNORMAL HIGH (ref 4.8–5.6)
Mean Plasma Glucose: 128 mg/dL

## 2016-06-09 LAB — ECHOCARDIOGRAM COMPLETE
HEIGHTINCHES: 66 in
WEIGHTICAEL: 3865.6 [oz_av]

## 2016-06-09 LAB — PROTIME-INR
INR: 3.46
PROTHROMBIN TIME: 35.6 s — AB (ref 11.4–15.2)

## 2016-06-09 MED ORDER — FUROSEMIDE 40 MG PO TABS: 40.0000 mg | ORAL_TABLET | Freq: Every day | ORAL | Status: DC

## 2016-06-09 MED ORDER — FUROSEMIDE 40 MG PO TABS
40.0000 mg | ORAL_TABLET | Freq: Every day | ORAL | Status: DC
  Administered 2016-06-10 – 2016-06-11 (×2): 40 mg via ORAL
  Filled 2016-06-09 (×2): qty 1

## 2016-06-09 MED ORDER — POTASSIUM CHLORIDE CRYS ER 20 MEQ PO TBCR
40.0000 meq | EXTENDED_RELEASE_TABLET | Freq: Every day | ORAL | Status: DC
Start: 2016-06-09 — End: 2016-06-11
  Administered 2016-06-09 – 2016-06-11 (×3): 40 meq via ORAL
  Filled 2016-06-09 (×3): qty 2

## 2016-06-09 MED ORDER — WHITE PETROLATUM GEL
Status: AC
  Administered 2016-06-09: 23:00:00
  Filled 2016-06-09: qty 1

## 2016-06-09 NOTE — Plan of Care (Signed)
Problem: Skin Integrity: Goal: Risk for impaired skin integrity will decrease Outcome: Progressing Buttocks is red, pt inc of urine.

## 2016-06-09 NOTE — Progress Notes (Signed)
Echocardiogram 2D Echocardiogram has been performed.  Yolanda James M 06/09/2016, 2:42 PM

## 2016-06-09 NOTE — [place] Notes (Signed)
STROKE TEAM PROGRESS NOTE   SUBJECTIVE (INTERVAL HISTORY) Pt is sitting in bed for lunch. She stated that she needs paracentesis for ascites. On coumadin and INR still supra therapeutic now.    OBJECTIVE Temp:  [97.8 F (36.6 C)-98.2 F (36.8 C)] 97.8 F (36.6 C) (11/20 0539) Pulse Rate:  [87-104] 90 (11/20 0539) Cardiac Rhythm: Atrial fibrillation (11/19 2100) Resp:  [15-18] 18 (11/20 0539) BP: (90-132)/(40-67) 132/67 (11/20 0539) SpO2:  [96 %-98 %] 96 % (11/20 0539)  CBC:   Recent Labs Lab 06/05/16 0327 06/07/16 1737 06/07/16 1756 06/09/16 0544  WBC 20.1* 24.5*  --  19.5*  NEUTROABS 17.5* 21.6*  --   --   HGB 10.0* 10.8* 11.6* 9.9*  HCT 31.2* 34.4* 34.0* 32.0*  MCV 77.4* 78.7  --  82.1  PLT 72* 122*  --  130*    Basic Metabolic Panel:   Recent Labs Lab 06/08/16 0042 06/08/16 0939 06/09/16 0544  NA  --  141 142  K  --  3.0* 3.3*  CL  --  109 111  CO2  --  24 23  GLUCOSE  --  88 85  BUN  --  19 16  CREATININE  --  0.79 0.67  CALCIUM  --  8.0* 7.8*  MG 1.7  --   --     Lipid Panel:     Component Value Date/Time   CHOL 110 06/08/2016 0107   CHOL 136 09/19/2013 0955   TRIG 102 06/08/2016 0107   TRIG 118 09/19/2013 0955   HDL 44 06/08/2016 0107   HDL 51 09/19/2013 0955   CHOLHDL 2.5 06/08/2016 0107   VLDL 20 06/08/2016 0107   LDLCALC 46 06/08/2016 0107   LDLCALC 61 09/19/2013 0955   HgbA1c:  Lab Results  Component Value Date   HGBA1C 5.9 (H) 09/03/2015   Urine Drug Screen: No results found for: LABOPIA, COCAINSCRNUR, LABBENZ, AMPHETMU, THCU, LABBARB    IMAGING I have personally reviewed the radiological images below and agree with the radiology interpretations.  Ct Head Wo Contrast 06/07/2016 No acute intracranial pathology.   Mri & Mra Head/brain Wo Cm 06/08/2016 1. Areas of mild diffusion restriction within the right MCA territory, including the right frontal cortex, right insula and anterior right temporal cortex as well as the tail of  the right caudate nucleus. ADC values are higher than typically seen in the acute setting, but this is still favored to indicate late acute to early subacute ischemic infarct.  2. No hemorrhage or mass effect.   Dg Chest Port 1 View 06/07/2016 Stable left pleural effusion with underlying opacity. No other change.  2D Echocardiogram  Left ventricle: Diffuse hypokinesis with abnormal septal motion   The cavity size was normal. Wall thickness was increased in a   pattern of mild LVH. Systolic function was mildly to moderately   reduced. The estimated ejection fraction was in the range of 40%   to 45%. The study is not technically sufficient to allow   evaluation of LV diastolic function. - Aortic valve: There was trivial regurgitation. - Left atrium: The atrium was mildly dilated. - Right atrium: The atrium was moderately dilated. - Atrial septum: No defect or patent foramen ovale was identified. - Tricuspid valve: There was moderate regurgitation. - Pulmonary arteries: PA peak pressure: 48 mm Hg (S).  Carotid Doppler   Bilateral: 1-39% ICA stenosis. Vertebral artery flow is antegrade.  EEG - This is an abnormal EEG demonstrating:  1.  Mild diffuse slowing of electrocerebral activity.  This can be seen in a wide variety of encephalopathic states including those of a toxic, metabolic, or degenerative nature.             2.   Mild focal slowing and decrease in beta activity over the right hemisphere compared to that of the left.  If not already done so, a structural lesion should be ruled out in the right hemisphere.             3.  No evidence of epileptiform activity.  This does not rule out a seizure disorder.   PHYSICAL EXAM Pleasant elderly lady not in distress. . Afebrile. Head is nontraumatic. Neck is supple without bruit.    Cardiac exam no murmur or gallop. Lungs are clear to auscultation. Distal pulses are well felt. Neurological Exam :  Awake alert oriented x 3  normal speech and language. Mild left lower face asymmetry. Tongue midline. No drift. Mild diminished fine finger movements on left. Orbits right over left upper extremity. Mild left grip weak.. Normal sensation . Normal coordination.   ASSESSMENT/PLAN Ms. KAMORE SANDOE is a 81 y.o. female with history of ovarian cancer undergoing chemotherapy with malignant ascites, atrial fibrillation on Coumadin with recent supra therapeutic INR (greater than 10 -> treated with IV vitamin K and fresh frozen plasma) associated with GI bleeding, leukopenia, thrombocytopenia, recent positive blood cultures (treated), diabetes mellitus, hyperlipidemia, hypertension, and congestive heart failure, presenting with left-sided weakness and dysarthria. She did not receive IV t-PA due to Coumadin therapy.  Stroke:  R MCA territory infarcts - embolic secondary to atrial fibrillation with fluctuating INR.  Resultant  Mild left hand grip weakness  MRI - right MCA territory infarct.  MRA - normal  Carotid Doppler - unremarkable  2D Echo - EF 40-45%  EEG - diffuse slowing, R>L, no seizure  LDL - 46  HgbA1c - 6.1  VTE prophylaxis - warfarin Diet Heart Room service appropriate? Yes; Fluid consistency: Thin  warfarin daily prior to admission (but on hold), now warfarin on hold due to high INR.   Patient counseled to be compliant with her antithrombotic medications  Ongoing aggressive stroke risk factor management  Therapy recommendations: SNF  Disposition: Pending  Atrial Fibrillation  Home anticoagulation:  warfarin daily - PTA dose was 3mg  daily as reported by patient and confirmed by [facility] med list, however it was currently on hold due to high INR  Warfarin continued in the hospital, currently on hold  INR 1.66 on admission  INR now 3.46  Due to fluctuating INR, we recommend to change to Xarelto 20mg  daily once INR < 2 and no more procedure planned. Pt is in agreement.      Hypertension  stable  Long-term BP goal normotensive  Hyperlipidemia  Home meds: Lipitor 80 mg daily resumed in hospital  LDL 46, goal < 70  Continue statin at discharge  Diabetes type II  HgbA1c 6.1, goal < 7.0  Controlled  CBG monitoring  SSI  Other Stroke Risk Factors  Advanced age  Former cigarette smoker - quit  Obesity, Body mass index is 39 kg/m., recommend weight loss, diet and exercise as appropriate   Chronic diastolic CHF  Other Active Problems  Ovarian cancer, on chemo  Bacteremia d/t pseudomonas - on cipro now  Leukocytosis - 9.4 -> 15.5 -> 20.1 -> 24.5->19.5 (afebrile)  Hypokalemia - supplemented - 3.3  Thrombocytopenia - 82 -> 91 -> 72 -> 122  Hospital day # 2  Neurology will sign off. Please call with questions. Pt will follow up with Dr. Erlinda Hong at [facility] in about 6 weeks. Thanks for the consult.  Rosalin Hawking, MD PhD Stroke Neurology 06/10/2016 12:06 AM   To contact Stroke Continuity provider, please refer to [email or web address] After hours, contact General Neurology

## 2016-06-09 NOTE — Care Management Note (Signed)
Case Management Note  Patient Details  Name: Yolanda James MRN: [id number] Date of Birth: 1931/06/22  Subjective/Objective:   Pt admitted with CVA.                  Action/Plan: PT/OT recommending SNF. CM following for d/c disposition.   Expected Discharge Date:                  Expected Discharge Plan:  Skilled Nursing Facility  In-House Referral:  Clinical Social Work  Discharge planning Services     Post Acute Care Choice:    Choice offered to:     DME Arranged:    DME Agency:     HH Arranged:    [place] Agency:     Status of Service:  In process, will continue to follow  If discussed at Long Length of Stay Meetings, dates discussed:    Additional Comments:  Pollie Friar, RN 06/09/2016, 4:20 PM

## 2016-06-09 NOTE — [place] Notes (Signed)
[place] for warfarin Indication: atrial fibrillation  Allergies  Allergen Reactions  . Orange Fruit [Citrus]     Patient Measurements: Height: '5\' 6"'$  (167.6 cm) Weight: 241 lb 9.6 oz (109.6 kg) IBW/kg (Calculated) : 59.3   Vital Signs: Temp: 97.8 F (36.6 C) (11/20 1056) Temp Source: Oral (11/20 1056) BP: 100/47 (11/20 1056) Pulse Rate: 77 (11/20 1056)  Labs:  Recent Labs  06/07/16 1737 06/07/16 1756 06/08/16 0107 06/08/16 0939 06/09/16 0544  HGB 10.8* 11.6*  --   --  9.9*  HCT 34.4* 34.0*  --   --  32.0*  PLT 122*  --   --   --  130*  LABPROT 35.9*  --  33.9*  --  35.6*  INR 3.50  --  3.26  --  3.46  CREATININE 0.77 0.80  --  0.79 0.67    Estimated Creatinine Clearance: 66.8 mL/min (by C-G formula based on SCr of 0.67 mg/dL).   Medical History: Past Medical History:  Diagnosis Date  . Acute blood loss anemia   . Chronic a-fib ([place])    a. DCCV 02/05/2007. b. sotalol stopped 2012. c. managed with rate control.  . Chronic diastolic CHF (congestive heart failure) (HCC)    associated with AFIB RVR  . Essential hypertension   . Glaucoma   . Hypokalemia   . Long term current use of anticoagulant therapy   . Osteoarthritis    hands and knees, back & knees & "all over"   . Pure hypercholesterolemia   . Slow transit constipation   . Type 2 diabetes mellitus without complication, with long-term current use of insulin (HCC)     Assessment: 60 YOF here with AMS. On warfarin PTA for Afib- PTA dose was '3mg'$  daily as reported by patient and confirmed by [facility] med list, however it was currently on hold with no other anticoagulant in place. Admit INR elevated at 3.5. Alk phos is elevated, other LFTs normal. She was admitted 11/11 and then discharged on 11/16. Her INR on discharge was 2.16 and d/c summary states to hold warfarin.   Patient has been on ciprofloxacin which can increase INR, and this was continued inpatient. No new drug  interactions noted.   INR is still supratherapeutic at 3.46  Goal of Therapy:  INR 2-3 Monitor platelets by anticoagulation protocol: Yes   Plan: -hold warfarin again tonight -daily INR -watch for s/s bleeding   Stop Cipro? (end date previously 11/20)  Thank you Anette Guarneri, PharmD [phone] 06/09/2016

## 2016-06-09 NOTE — Procedures (Signed)
HPI:  82 y/o with L sided weakness  TECHNICAL SUMMARY:  A multichannel referential and bipolar montage EEG using the standard international 10-20 system was performed on the patient.  The dominant background activity consists of 7-8 hertz activity seen most prominantly over the posterior head region.  The background activity is more disorganized posteriorly on the right compared to that of the left.  There is decreased beta activity on the right compared to that of the left.  The backgound activity is nonreactive to eye opening and closing procedures.   ACTIVATION:  Stepwise photic stimulation and hyperventilation were not performed  EPILEPTIFORM ACTIVITY:  There were no spikes, sharp waves or paroxysmal activity.  SLEEP:  Stage I sleep was noted, but no stage II sleep.  CARDIAC:  The EKG lead revealed an irregular rhythm.  IMPRESSION:  This is an abnormal EEG demonstrating:  1.  Mild diffuse slowing of electrocerebral activity.  This can be seen in a wide variety of encephalopathic states including those of a toxic, metabolic, or degenerative nature.  2.   Mild focal slowing and decrease in beta activity over the right hemisphere compared to that of the left.  If not already done so, a structural lesion should be ruled out in the right hemisphere.  3.  No evidence of epileptiform activity.  This does not rule out a seizure disorder.

## 2016-06-09 NOTE — Progress Notes (Signed)
EEG completed, results pending. 

## 2016-06-09 NOTE — Progress Notes (Signed)
VASCULAR LAB PRELIMINARY  PRELIMINARY  PRELIMINARY  PRELIMINARY  Carotid duplex completed.    Preliminary report:  1-39% ICA plaquing. Vertebral artery flow is antegrade.   Dominik Yordy, RVT 06/09/2016, 4:12 PM

## 2016-06-09 NOTE — Consult Note (Signed)
   [facility] CM Inpatient Consult   06/09/2016  Yolanda James 05-07-1931 [id number]  Chart screened for [place] Management services as re-admission in the past 30 days and in the [facility] ACO Registry.  Chart review reveals per MD note Andee Poles Whitleyis an 82 y.o.femalewho presented to the ED with new onset of left facial droop, left hemiparesis and dysarthria.  She has ovarian CA with malignant ascites, on chemotherapy. Has recent GI bleed due to supratherapeutic INR of >10 for which she was previously admitted. Also leukopenic and thrombocytopenic. Has recent pseudomonas aeruginosa positive blood cultures for which she was on antibiotics. She has chronic atrial fibrillation, frequently with RVR, and has been anticoagulated with Coumadin, but is thought to also auto-anticoagulate because of her ovarian CA, possibly being the main reason for her successive supratherapeutic INRs.  Also, chart review reveal that the patient and family's disposition plan per therapy recommendations is to continue at a skilled nursing facility.  Patient was previously at [facility].  No current [facility] care management needs.  For questions, please contact:  Natividad Brood, RN BSN [facility] Liaison  [phone] business mobile phone Toll free office [phone]

## 2016-06-09 NOTE — Progress Notes (Signed)
PT Cancellation Note  Patient Details Name: Yolanda James MRN: [id number] DOB: [birth year]   Cancelled Treatment:    Reason Eval/Treat Not Completed: Patient at procedure or test/unavailable.  Will continue efforts if time permits.   Bary Castilla 06/09/2016, 3:52 PM  Rito Ehrlich. [place], [place]

## 2016-06-09 NOTE — Evaluation (Signed)
Speech Language Pathology Evaluation Patient Details Name: Yolanda James MRN: [id number] DOB: 1930-07-25 Today's Date: 06/09/2016 Time: [id number] SLP Time Calculation (min) (ACUTE ONLY): 32 min  Problem List:  Patient Active Problem List   Diagnosis Date Noted  . CVA (cerebral vascular accident) ([place])   . Hypokalemia   . Left-sided weakness 06/07/2016  . Facial droop 06/07/2016  . Sepsis ([place]) 06/03/2016  . Bacteremia due to Pseudomonas 06/03/2016  . Pancytopenia, acquired ([place])   . Bloody diarrhea 05/31/2016  . Gastrointestinal bleed 05/31/2016  . Drug-induced neutropenia ([place]) 05/31/2016  . Diarrhea 05/28/2016  . Hypomagnesemia 05/28/2016  . Ovarian cancer ([place]) 05/15/2016  . Ascites 05/15/2016  . Carcinomatosis ([place]) 05/15/2016  . Bilateral lower extremity edema 11/26/2015  . Arthritis of right hip 09/03/2015  . Primary osteoarthritis of right hip 09/02/2015  . Essential hypertension, benign   . Atrial fibrillation with RVR ([place])   . Pure hypercholesterolemia   . Encounter for therapeutic drug monitoring 08/18/2013  . Chronic atrial fibrillation ([place]) 05/26/2013   Past Medical History:  Past Medical History:  Diagnosis Date  . Acute blood loss anemia   . Chronic a-fib ([place])    a. DCCV 02/05/2007. b. sotalol stopped 2012. c. managed with rate control.  . Chronic diastolic CHF (congestive heart failure) (HCC)    associated with AFIB RVR  . Essential hypertension   . Glaucoma   . Hypokalemia   . Long term current use of anticoagulant therapy   . Osteoarthritis    hands and knees, back & knees & "all over"   . Pure hypercholesterolemia   . Slow transit constipation   . Type 2 diabetes mellitus without complication, with long-term current use of insulin (HCC)    Past Surgical History:  Past Surgical History:  Procedure Laterality Date  . EYE SURGERY     laser & cataracts removed & IOL both eyes, glaucoma surgery   . GANGLION CYST EXCISION Left 1965   wrist  .  TOTAL HIP ARTHROPLASTY Right 09/03/2015   Procedure: TOTAL HIP ARTHROPLASTY;  Surgeon: Frederik Pear, MD;  Location: [place];  Service: Orthopedics;  Laterality: Right;  . vaginal births     x3   HPI:  82 yo female adm to [facility] with slurred speech, weakness, left facial droop, left arm drift.  PMH + for ovarian cancer undergoing chemotherapy.  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   Speech and language evaluation ordered.  Pt reports she lives alone and states her family is very helpful to her = advising they can help manage bills, medicines, appts, etc if needed.    Assessment / Plan / Recommendation Clinical Impression  Pt presents with mild dysarthria, left facial asymmetry and mild cognitive deficits with intact expressive/receptive language skills.  She was with disorientation to specific date and had memory impairment to recall task.  Pt recalled 2/4 independently and 2/4 with category cue within 10 minutes.  Mild imprecise articulation and hoarse voice noted but speech is intelligible and pt speech/voice are baseline.   As pt was independent prior to admission, recommend further diagnostic treatment to assure cognitive skills allow pt to be independent in managing home duties, bills, medications, appts, etc.    Of note, pt with coating on tongue - which she reports is due to chemotherapy and states she was on an antibiotic for it.  In addition, she reports fluid retention to be significant and is inquiring re: need for fluid to be removed, stating she has undergone this procedure previously.    Reviewed memory compensation strategies and utilized functionally during session to help recall medical information.  Pt agreeable to plan for follow up.     SLP Assessment  Patient needs continued Speech Lanaguage Pathology Services    Follow Up Recommendations  24  hour supervision/assistance    Frequency and Duration min 1 x/week  2 weeks      SLP Evaluation Cognition  Overall Cognitive Status: No family/caregiver present to determine baseline cognitive functioning Arousal/Alertness: Awake/alert Orientation Level: Oriented to person;Oriented to place;Disoriented to time;Oriented to situation (stated date was 11/18 cued to calendar self corrected) Attention: Sustained Sustained Attention: Appears intact Sustained Attention Impairment: Functional complex (need to see about getting fluid drawn off) Memory: Impaired Memory Impairment: Retrieval deficit (2/4 words independently, 2/4 with category cue) Awareness: Appears intact Problem Solving: Appears intact Safety/Judgment: Appears intact       Comprehension  Auditory Comprehension Overall Auditory Comprehension: Appears within functional limits for tasks assessed Yes/No Questions: Not tested Commands: Within Functional Limits Conversation: Complex Visual Recognition/Discrimination Discrimination: Not tested Reading Comprehension Reading Status:  (for single words, calendar [facility])    Expression Verbal Expression Overall Verbal Expression: Appears within functional limits for tasks assessed Initiation: No impairment Level of Generative/Spontaneous Verbalization: Conversation Repetition: No impairment Naming: Not tested Pragmatics: No impairment Written Expression Dominant Hand: Right Written Expression: Not tested (pt with significant edema )   Oral / Motor  Oral Motor/Sensory Function Overall Oral Motor/Sensory Function: Mild impairment Facial ROM: Reduced left Facial Symmetry: Abnormal symmetry left Facial Strength: Reduced left Lingual ROM: Within Functional Limits Lingual Symmetry: Within Functional Limits (tongue is coated = sloughing off) Lingual Strength: Within Functional Limits Lingual Sensation: Within Functional Limits Velum: Within Functional Limits Mandible: Within  Functional Limits Motor Speech Overall Motor Speech: Appears within functional limits for tasks assessed Respiration: Impaired Level of Impairment: Sentence Phonation: Hoarse Resonance: Within functional limits Articulation: Impaired (imprecise articulation but pt reports is baseline) Intelligibility: Intelligible Motor Planning: Witnin functional limits Motor Speech Errors: Not applicable Interfering Components: Premorbid status   GO                    Yolanda James, [place] [facility] SLP [phone]

## 2016-06-09 NOTE — Progress Notes (Signed)
PROGRESS NOTE    Yolanda James  [id number] DOB: 15-Sep-1930 DOA: 06/07/2016 PCP: [name], MD   Brief Narrative: 82 year old female with history of ovarian cancer on chemotherapy, atrial fibrillation on chronic Coumadin, recently admitted for GI bleed, Pseudomonas bacteremia currently on oral antibiotics, discharged to rehabilitation presented with slurred speech and facial droop and left-sided weakness. Patient with acute right MCA ischemic infarct. Neurology evaluation ongoing.  Assessment & Plan:  #Acute/subacute right MCA territory ischemic infarction: The patient presented with slurred speech, facial droop and left-sided weakness. The symptoms are improving. Patient has minimal facial droop and has no slurred speech today. Already evaluated by neurology team. I discussed with the stroke team. -Pending echocardiogram and a carotid ultrasound -EEG with no epileptiform activity. -holding lasix. On metoprolol and diltiazem with parameters. -Continue Lipitor -INR supratherapeutic. -PT, OT and social worker evaluation. -Speech and swallow evaluation.  # Chronic atrial fibrillation [facility]): Continue metoprolol, diltiazem with parameters. Pt has tendency to go to RVR. INR supratherapeutic. Discussed with Dr. Erlinda Hong from neurology who recommended to discontinue Coumadin and start xarelto when INR is less than 2. I discussed with the pharmacist. -I discontinue Coumadin because of high INR. Continue to monitor INR daily.  #Hypokalemia: On potassium chloride pills. His repeat potassium in the morning.Marland Kitchen    #Ovarian cancer [facility]): On chemotherapy. Advised outpatient follow-up with oncologist. -Patient reported abdominal distention and requesting to do paracentesis. Abdominal paracentesis was ordered today. Currently she has high INR probably unable to do because of increased bleeding risk. Follow up with radiology team. -resumed lasix with parameters.  #  Bacteremia due to Pseudomonas:  Recently discharged with ciprofloxacin. Continue till November 26.   #Type 2 diabetes: Continue to monitor blood sugar level. Continue sliding scale.  #Leukocytosis: On reviewing patient's record patient has chronic leukocytosis. It is likely related with underlying cancer. She might have received Neupogen after chemotherapy.    DVT prophylaxis: Systemic anticoagulation Code Status: Full code Family Communication: No family present at bedside today. Disposition Plan: Likely discharge to SNF/rehabilitation in 1-2 days.    Consultants:   Neurologist  Procedures: MRI Antimicrobials: Ciprofloxacin  Subjective: Patient was seen and examined at bedside. Patient reported abdominal distention and wanted to to do paracentesis. Denied headache, dizziness, nausea, vomiting, chest pain or shortness of breath. Reports generalized weakness.  Objective: Vitals:   06/09/16 0210 06/09/16 0539 06/09/16 1056 06/09/16 1359  BP: (!) 100/45 132/67 (!) 100/47 110/61  Pulse: (!) 104 90 77 93  Resp: 18 18 18 18   Temp: 97.8 F (36.6 C) 97.8 F (36.6 C) 97.8 F (36.6 C) 98.4 F (36.9 C)  TempSrc: Oral Oral Oral Oral  SpO2: 97% 96% 90%   Weight:      Height:        Intake/Output Summary (Last 24 hours) at 06/09/16 1431 Last data filed at 06/09/16 1300  Gross per 24 hour  Intake              240 ml  Output                0 ml  Net              240 ml   Filed Weights   06/07/16 2324  Weight: 109.6 kg (241 lb 9.6 oz)    Examination:  General exam: Not in distress, appeared comfortable. Respiratory system: Clear to auscultation. Respiratory effort normal. No wheezing or crackle Cardiovascular system: S1 & S2 heard, RRR.  No pedal edema. Gastrointestinal  system: Abdomen soft, distended, nontender. Bowel sound positive. Central nervous system: Alert, awake, sensory exam normal. Extremities: Muscle strength 5 over 5 in all extremities. Symmetric. Skin: No rashes, lesions or  ulcers Psychiatry: Judgement and insight appear normal. Mood & affect appropriate.     Data Reviewed: I have personally reviewed following labs and imaging studies  CBC:  Recent Labs Lab 06/03/16 0321 06/04/16 0319 06/05/16 0327 06/07/16 1737 06/07/16 1756 06/09/16 0544  WBC 9.4 15.5* 20.1* 24.5*  --  19.5*  NEUTROABS 7.8* 13.5* 17.5* 21.6*  --   --   HGB 9.5* 10.3* 10.0* 10.8* 11.6* 9.9*  HCT 29.4* 32.1* 31.2* 34.4* 34.0* 32.0*  MCV 77.6* 77.2* 77.4* 78.7  --  82.1  PLT 82* 91* 72* 122*  --  [id number]   Basic Metabolic Panel:  Recent Labs Lab 06/04/16 0319 06/05/16 0327 06/07/16 1737 06/07/16 1756 06/08/16 0042 06/08/16 0939 06/09/16 0544  NA 137 140 140 141  --  141 142  K 3.1* 3.7 3.1* 3.3*  --  3.0* 3.3*  CL 103 110 109 106  --  109 111  CO2 25 21* 24  --   --  24 23  GLUCOSE 129* 134* 85 85  --  88 85  BUN 25* 26* 20 20  --  19 16  CREATININE 0.91 0.73 0.77 0.80  --  0.79 0.67  CALCIUM 8.0* 7.9* 8.1*  --   --  8.0* 7.8*  MG  --   --   --   --  1.7  --   --    GFR: Estimated Creatinine Clearance: 66.8 mL/min (by C-G formula based on SCr of 0.67 mg/dL). Liver Function Tests:  Recent Labs Lab 06/07/16 1737  AST 29  ALT 27  ALKPHOS 133*  BILITOT 0.7  PROT 5.1*  ALBUMIN 2.1*   No results for input(s): LIPASE, AMYLASE in the last 168 hours. No results for input(s): AMMONIA in the last 168 hours. Coagulation Profile:  Recent Labs Lab 06/04/16 0319 06/05/16 0327 06/07/16 1737 06/08/16 0107 06/09/16 0544  INR 1.66 2.16 3.50 3.26 3.46   Cardiac Enzymes: No results for input(s): CKTOTAL, CKMB, CKMBINDEX, TROPONINI in the last 168 hours. BNP (last 3 results) No results for input(s): PROBNP in the last 8760 hours. HbA1C:  Recent Labs  06/08/16 0107  HGBA1C 6.1*   CBG:  Recent Labs Lab 06/08/16 1053 06/08/16 1656 06/08/16 2144 06/09/16 0629 06/09/16 1108  GLUCAP 87 96 93 94 77   Lipid Profile:  Recent Labs  06/08/16 0107  CHOL 110   HDL 44  LDLCALC 46  TRIG 102  CHOLHDL 2.5   Thyroid Function Tests: No results for input(s): TSH, T4TOTAL, FREET4, T3FREE, THYROIDAB in the last 72 hours. Anemia Panel: No results for input(s): VITAMINB12, FOLATE, FERRITIN, TIBC, IRON, RETICCTPCT in the last 72 hours. Sepsis Labs:  Recent Labs Lab 06/07/16 1758 06/07/16 2043  LATICACIDVEN 1.37 0.81    Recent Results (from the past 240 hour(s))  Culture, blood (routine x 2)     Status: Abnormal   Collection Time: 05/31/16  1:20 PM  Result Value Ref Range Status   Specimen Description BLOOD RIGHT ANTECUBITAL  Final   Special Requests BOTTLES DRAWN AEROBIC ONLY 6CC  Final   Culture  Setup Time   Final    GRAM NEGATIVE RODS AEROBIC BOTTLE ONLY CRITICAL RESULT CALLED TO, READ BACK BY AND VERIFIED WITH: D WOFFORD,PHARMD AT 1253 06/01/16 BY L BENFIELD    Culture (A)  Final  PSEUDOMONAS AERUGINOSA SUSCEPTIBILITIES PERFORMED ON PREVIOUS CULTURE WITHIN THE LAST 5 DAYS. Performed at [facility]    Report Status 06/03/2016 FINAL  Final  Culture, blood (routine x 2)     Status: Abnormal   Collection Time: 05/31/16  1:32 PM  Result Value Ref Range Status   Specimen Description BLOOD RIGHT ANTECUBITAL  Final   Special Requests BOTTLES DRAWN AEROBIC ONLY 5CC  Final   Culture  Setup Time   Final    GRAM NEGATIVE RODS AEROBIC BOTTLE ONLY CRITICAL RESULT CALLED TO, READ BACK BY AND VERIFIED WITH: D WOFFORD,PHARMD AT 1253 06/01/16 BY L BENFIELD Performed at [place] (A)  Final   Report Status 06/03/2016 FINAL  Final   Organism ID, Bacteria PSEUDOMONAS AERUGINOSA  Final      Susceptibility   Pseudomonas aeruginosa - MIC*    CEFTAZIDIME 4 SENSITIVE Sensitive     CIPROFLOXACIN <=0.25 SENSITIVE Sensitive     GENTAMICIN 2 SENSITIVE Sensitive     IMIPENEM 2 SENSITIVE Sensitive     PIP/TAZO 16 SENSITIVE Sensitive     CEFEPIME 4 SENSITIVE Sensitive     * PSEUDOMONAS AERUGINOSA   Blood Culture ID Panel (Reflexed)     Status: Abnormal   Collection Time: 05/31/16  1:32 PM  Result Value Ref Range Status   Enterococcus species NOT DETECTED NOT DETECTED Final   Listeria monocytogenes NOT DETECTED NOT DETECTED Final   Staphylococcus species NOT DETECTED NOT DETECTED Final   Staphylococcus aureus NOT DETECTED NOT DETECTED Final   Streptococcus species NOT DETECTED NOT DETECTED Final   Streptococcus agalactiae NOT DETECTED NOT DETECTED Final   Streptococcus pneumoniae NOT DETECTED NOT DETECTED Final   Streptococcus pyogenes NOT DETECTED NOT DETECTED Final   Acinetobacter baumannii NOT DETECTED NOT DETECTED Final   Enterobacteriaceae species NOT DETECTED NOT DETECTED Final   Enterobacter cloacae complex NOT DETECTED NOT DETECTED Final   Escherichia coli NOT DETECTED NOT DETECTED Final   Klebsiella oxytoca NOT DETECTED NOT DETECTED Final   Klebsiella pneumoniae NOT DETECTED NOT DETECTED Final   Proteus species NOT DETECTED NOT DETECTED Final   Serratia marcescens NOT DETECTED NOT DETECTED Final   Carbapenem resistance NOT DETECTED NOT DETECTED Final   Haemophilus influenzae NOT DETECTED NOT DETECTED Final   Neisseria meningitidis NOT DETECTED NOT DETECTED Final   Pseudomonas aeruginosa DETECTED (A) NOT DETECTED Final    Comment: CRITICAL RESULT CALLED TO, READ BACK BY AND VERIFIED WITH: D WOFFORD,PHARMD AT 1253 06/01/16 BY L BENFIELD    Candida albicans NOT DETECTED NOT DETECTED Final   Candida glabrata NOT DETECTED NOT DETECTED Final   Candida krusei NOT DETECTED NOT DETECTED Final   Candida parapsilosis NOT DETECTED NOT DETECTED Final   Candida tropicalis NOT DETECTED NOT DETECTED Final    Comment: Performed at [facility]  MRSA PCR Screening     Status: None   Collection Time: 05/31/16  2:34 PM  Result Value Ref Range Status   MRSA by PCR NEGATIVE NEGATIVE Final    Comment:        The GeneXpert MRSA Assay (FDA approved for NASAL specimens only), is  one component of a comprehensive MRSA colonization surveillance program. It is not intended to diagnose MRSA infection nor to guide or monitor treatment for MRSA infections.   C difficile quick scan w PCR reflex     Status: None   Collection Time: 05/31/16 11:30 PM  Result Value Ref Range Status  C Diff antigen NEGATIVE NEGATIVE Final   C Diff toxin NEGATIVE NEGATIVE Final   C Diff interpretation No C. difficile detected.  Final  Gastrointestinal Panel by PCR , Stool     Status: None   Collection Time: 05/31/16 11:30 PM  Result Value Ref Range Status   Campylobacter species NOT DETECTED NOT DETECTED Final   Plesimonas shigelloides NOT DETECTED NOT DETECTED Final   Salmonella species NOT DETECTED NOT DETECTED Final   Yersinia enterocolitica NOT DETECTED NOT DETECTED Final   Vibrio species NOT DETECTED NOT DETECTED Final   Vibrio cholerae NOT DETECTED NOT DETECTED Final   Enteroaggregative E coli (EAEC) NOT DETECTED NOT DETECTED Final   Enteropathogenic E coli (EPEC) NOT DETECTED NOT DETECTED Final   Enterotoxigenic E coli (ETEC) NOT DETECTED NOT DETECTED Final   Shiga like toxin producing E coli (STEC) NOT DETECTED NOT DETECTED Final   Shigella/Enteroinvasive E coli (EIEC) NOT DETECTED NOT DETECTED Final   Cryptosporidium NOT DETECTED NOT DETECTED Final   Cyclospora cayetanensis NOT DETECTED NOT DETECTED Final   Entamoeba histolytica NOT DETECTED NOT DETECTED Final   Giardia lamblia NOT DETECTED NOT DETECTED Final   Adenovirus F40/41 NOT DETECTED NOT DETECTED Final   Astrovirus NOT DETECTED NOT DETECTED Final   Norovirus GI/GII NOT DETECTED NOT DETECTED Final   Rotavirus A NOT DETECTED NOT DETECTED Final   Sapovirus (I, II, IV, and V) NOT DETECTED NOT DETECTED Final  Blood culture (routine x 2)     Status: None (Preliminary result)   Collection Time: 06/08/16 12:39 AM  Result Value Ref Range Status   Specimen Description BLOOD LEFT ARM  Final   Special Requests BOTTLES DRAWN  AEROBIC AND ANAEROBIC 5CC EA  Final   Culture NO GROWTH 1 DAY  Final   Report Status PENDING  Incomplete  Blood culture (routine x 2)     Status: None (Preliminary result)   Collection Time: 06/08/16 12:44 AM  Result Value Ref Range Status   Specimen Description BLOOD RIGHT ANTECUBITAL  Final   Special Requests BOTTLES DRAWN AEROBIC ONLY [place]  Final   Culture NO GROWTH 1 DAY  Final   Report Status PENDING  Incomplete         Radiology Studies: Ct Head Wo Contrast  Result Date: 06/07/2016 CLINICAL DATA:  Altered mental status EXAM: CT HEAD WITHOUT CONTRAST TECHNIQUE: Contiguous axial images were obtained from the base of the skull through the vertex without intravenous contrast. COMPARISON:  None. FINDINGS: Brain: No evidence of acute infarction, hemorrhage, extra-axial collection, ventriculomegaly, or mass effect. Periventricular white matter low attenuation likely secondary to microangiopathy. Vascular: Cerebrovascular atherosclerotic calcifications are noted. Skull: Negative for fracture or focal lesion. Sinuses/Orbits: Visualized portions of the orbits are unremarkable. Visualized portions of the paranasal sinuses and mastoid air cells are unremarkable. Other: None. IMPRESSION: No acute intracranial pathology. Electronically Signed   By: Kathreen Devoid   On: 06/07/2016 20:39   Mr Brain W Wo Contrast  Result Date: 06/08/2016 CLINICAL DATA:  Left facial droop, left hemi paresis and dysarthria EXAM: MRI HEAD WITHOUT CONTRAST MRA HEAD WITHOUT CONTRAST TECHNIQUE: Multiplanar, multiecho pulse sequences of the brain and surrounding structures were obtained without intravenous contrast. Angiographic images of the head were obtained using MRA technique without contrast. COMPARISON:  CT head 06/07/2016. FINDINGS: MRI HEAD FINDINGS Brain: There are high values on diffusion-weighted imaging within the tail of the right caudate nucleus and along the lateral right frontal cortex of, insula and anterior  right temporal lobe.  ADC values are mildly decreased but less so than would be expected in the acute phase. The midline structures are normal. There is associated hyperintense T2 weighted signal in the distribution corresponding to the above-described diffusion abnormalities. No mass lesion or midline shift. No hydrocephalus or extra-axial fluid collection. No age advanced or lobar predominant atrophy. Vascular: Major intracranial arterial and venous sinus flow voids are preserved. No evidence of chronic microhemorrhage or amyloid angiopathy. Skull and upper cervical spine: The visualized skull base, calvarium, upper cervical spine and extracranial soft tissues are normal. Sinuses/Orbits: No fluid levels or advanced mucosal thickening. No mastoid effusion. Normal orbits. MRA HEAD FINDINGS Intracranial internal carotid arteries: Normal. Anterior cerebral arteries: Absence of the right A1 segment is a normal variant. Otherwise normal. Middle cerebral arteries: Normal. Posterior communicating arteries: Present bilaterally. Posterior cerebral arteries: Normal. Basilar artery: Normal. Vertebral arteries: Codominant. Normal. Superior cerebellar arteries: Normal. Anterior inferior cerebellar arteries: Normal. Posterior inferior cerebellar arteries: Normal. IMPRESSION: 1. Areas of mild diffusion restriction within the right MCA territory, including the right frontal cortex, right insula and anterior right temporal cortex as well as the tail of the right caudate nucleus. ADC values are higher than typically seen in the acute setting, but this is still favored to indicate late acute to early subacute ischemic infarct. 2. No hemorrhage or mass effect. These results will be called to the ordering clinician or representative by the Radiologist Assistant, and communication documented in the PACS or zVision Dashboard. Electronically Signed   By: Ulyses Jarred M.D.   On: 06/08/2016 03:45   Dg Chest Port 1 View  Result Date:  06/07/2016 CLINICAL DATA:  Left-sided weakness.  Pitting edema. EXAM: PORTABLE CHEST 1 VIEW COMPARISON:  May 31, 2016 FINDINGS: There is a small left effusion with underlying opacity, unchanged. No other interval changes or acute abnormalities. IMPRESSION: Stable left pleural effusion with underlying opacity. No other change. Electronically Signed   By: Dorise Bullion III M.D   On: 06/07/2016 17:55   Mr Jodene Nam Head/brain Wo Cm  Result Date: 06/08/2016 CLINICAL DATA:  Left facial droop, left hemi paresis and dysarthria EXAM: MRI HEAD WITHOUT CONTRAST MRA HEAD WITHOUT CONTRAST TECHNIQUE: Multiplanar, multiecho pulse sequences of the brain and surrounding structures were obtained without intravenous contrast. Angiographic images of the head were obtained using MRA technique without contrast. COMPARISON:  CT head 06/07/2016. FINDINGS: MRI HEAD FINDINGS Brain: There are high values on diffusion-weighted imaging within the tail of the right caudate nucleus and along the lateral right frontal cortex of, insula and anterior right temporal lobe. ADC values are mildly decreased but less so than would be expected in the acute phase. The midline structures are normal. There is associated hyperintense T2 weighted signal in the distribution corresponding to the above-described diffusion abnormalities. No mass lesion or midline shift. No hydrocephalus or extra-axial fluid collection. No age advanced or lobar predominant atrophy. Vascular: Major intracranial arterial and venous sinus flow voids are preserved. No evidence of chronic microhemorrhage or amyloid angiopathy. Skull and upper cervical spine: The visualized skull base, calvarium, upper cervical spine and extracranial soft tissues are normal. Sinuses/Orbits: No fluid levels or advanced mucosal thickening. No mastoid effusion. Normal orbits. MRA HEAD FINDINGS Intracranial internal carotid arteries: Normal. Anterior cerebral arteries: Absence of the right A1 segment  is a normal variant. Otherwise normal. Middle cerebral arteries: Normal. Posterior communicating arteries: Present bilaterally. Posterior cerebral arteries: Normal. Basilar artery: Normal. Vertebral arteries: Codominant. Normal. Superior cerebellar arteries: Normal. Anterior inferior cerebellar arteries: Normal. Posterior  inferior cerebellar arteries: Normal. IMPRESSION: 1. Areas of mild diffusion restriction within the right MCA territory, including the right frontal cortex, right insula and anterior right temporal cortex as well as the tail of the right caudate nucleus. ADC values are higher than typically seen in the acute setting, but this is still favored to indicate late acute to early subacute ischemic infarct. 2. No hemorrhage or mass effect. These results will be called to the ordering clinician or representative by the Radiologist Assistant, and communication documented in the PACS or zVision Dashboard. Electronically Signed   By: Ulyses Jarred M.D.   On: 06/08/2016 03:45        Scheduled Meds: . atorvastatin  80 mg Oral q1800  . ciprofloxacin  750 mg Oral BID  . diltiazem  240 mg Oral Daily  . dorzolamide  1 drop Both Eyes BID  . insulin aspart  0-5 Units Subcutaneous QHS  . insulin aspart  0-9 Units Subcutaneous TID WC  . latanoprost  1 drop Both Eyes QHS  . metoprolol  25 mg Oral BID  . potassium chloride  40 mEq Oral Daily  . timolol  1 drop Both Eyes BID   Continuous Infusions:   LOS: 2 days    Shashwat Cleary Tanna Furry, MD [facility] Pager [phone]  If 7PM-7AM, please contact night-coverage [email or web address] Password [facility] 06/09/2016, 2:31 PM

## 2016-06-10 DIAGNOSIS — R471 Dysarthria and anarthria: Secondary | ICD-10-CM

## 2016-06-10 DIAGNOSIS — D72829 Elevated white blood cell count, unspecified: Secondary | ICD-10-CM

## 2016-06-10 DIAGNOSIS — IMO0002 Reserved for concepts with insufficient information to code with codable children: Secondary | ICD-10-CM

## 2016-06-10 LAB — BASIC METABOLIC PANEL
ANION GAP: 8 (ref 5–15)
BUN: 13 mg/dL (ref 6–20)
CALCIUM: 7.8 mg/dL — AB (ref 8.9–10.3)
CO2: 25 mmol/L (ref 22–32)
CREATININE: 0.63 mg/dL (ref 0.44–1.00)
Chloride: 109 mmol/L (ref 101–111)
GFR calc non Af Amer: >60 mL/min (ref >60)
Glucose, Bld: 98 mg/dL (ref 65–99)
Potassium: 3.5 mmol/L (ref 3.5–5.1)
SODIUM: 142 mmol/L (ref 135–145)

## 2016-06-10 LAB — PROTIME-INR
INR: 3.6
PROTHROMBIN TIME: 36.8 s — AB (ref 11.4–15.2)

## 2016-06-10 LAB — VAS US CAROTID
LCCADSYS: 74 cm/s
LEFT ECA DIAS: -15 cm/s
LEFT VERTEBRAL DIAS: -9 cm/s
LICAPDIAS: -22 cm/s
Left CCA dist dias: 19 cm/s
Left CCA prox dias: 22 cm/s
Left CCA prox sys: 83 cm/s
Left ICA dist dias: -32 cm/s
Left ICA dist sys: -91 cm/s
Left ICA prox sys: -67 cm/s
RCCAPDIAS: 16 cm/s
RCCAPSYS: 76 cm/s
RIGHT ECA DIAS: -12 cm/s
RIGHT VERTEBRAL DIAS: 17 cm/s
Right cca dist sys: 60 cm/s

## 2016-06-10 LAB — GLUCOSE, CAPILLARY
GLUCOSE-CAPILLARY: 91 mg/dL (ref 65–99)
Glucose-Capillary: 102 mg/dL — ABNORMAL HIGH (ref 65–99)
Glucose-Capillary: 105 mg/dL — ABNORMAL HIGH (ref 65–99)
Glucose-Capillary: 101 mg/dL — ABNORMAL HIGH (ref 65–99)

## 2016-06-10 MED ORDER — VITAMIN K1 10 MG/ML IJ SOLN
5.0000 mg | Freq: Once | INTRAVENOUS | Status: AC
  Administered 2016-06-10: 5 mg via INTRAVENOUS
  Filled 2016-06-10: qty 0.5

## 2016-06-10 NOTE — Progress Notes (Signed)
Physical Therapy Treatment Patient Details Name: Yolanda James MRN: [id number] DOB: [birth year] Today's Date: 06/10/2016    History of Present Illness 82 y.o.femalewith medical history significant ofovarian CA on chemotherapy, A. Fib on chronic coumadin presenting with slurred speech, facial droop, and L arm drift. 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    PT Comments    Patient alert and eager to try to use BSC. Required 2 person assist for stand-pivot with RW bed to [facility] to chair. Patient unable to ambulate farther due to fatigue and LE weakness. May do well with close follow with chair to maximize distance.   Follow Up Recommendations  SNF;Supervision/Assistance - 24 hour     Equipment Recommendations  None recommended by PT    Recommendations for Other Services       Precautions / Restrictions Precautions Precautions: Fall Restrictions Weight Bearing Restrictions: No    Mobility  Bed Mobility Overal bed mobility: Needs Assistance Bed Mobility: Supine to Sit     Supine to sit: +2 for physical assistance;Mod assist     General bed mobility comments: assist for LEs to EOB and to raise trunk, assist to scoot hips to EOB with pad  Transfers Overall transfer level: Needs assistance Equipment used: Rolling walker (2 wheeled) Transfers: Sit to/from [facility] Sit to Stand: +2 physical assistance;Mod assist;Max assist Stand pivot transfers: +2 physical assistance;Min assist       General transfer comment: +2 mod from bed, max from [facility], used momentum  Ambulation/Gait             General Gait Details: pivotal steps only. Felt too weak to walk more   Stairs            Wheelchair Mobility    Modified Rankin (Stroke Patients Only) Modified Rankin (Stroke Patients Only) Pre-Morbid Rankin Score:  Moderately severe disability Modified Rankin: Severe disability     Balance     Sitting balance-Leahy Scale: Fair     Standing balance support: Bilateral upper extremity supported Standing balance-Leahy Scale: Poor                      Cognition Arousal/Alertness: Awake/alert Behavior During Therapy: WFL for tasks assessed/performed;Flat affect Overall Cognitive Status: Within Functional Limits for tasks assessed                      Exercises      General Comments        Pertinent Vitals/Pain Pain Assessment: No/denies pain    Home Living                      Prior Function            PT Goals (current goals can now be found in the care plan section) Acute Rehab PT Goals Patient Stated Goal: "I want to be able to go home" Time For Goal Achievement: 06/22/16 Progress towards PT goals: Progressing toward goals    Frequency    Min 2X/week      PT Plan Current plan remains appropriate    Co-evaluation PT/OT/SLP Co-Evaluation/Treatment: Yes Reason for Co-Treatment: For patient/therapist safety PT goals addressed during session: Mobility/safety with mobility;Proper use of DME OT goals addressed during session: ADL's and self-care     End of Session Equipment Utilized During Treatment: Gait  belt Activity Tolerance: Patient limited by fatigue Patient left: with call bell/phone within reach;in chair;with chair alarm set     Time: [id number] PT Time Calculation (min) (ACUTE ONLY): 26 min  Charges:  $Therapeutic Activity: 8-22 mins                    G CodesJeanie James Yolanda James 07/04/16, 4:57 PM Pager [phone]

## 2016-06-10 NOTE — Progress Notes (Signed)
CSW continuing to follow for return to [facility] at pending medical clearance.   Wandra Feinstein, MSW, LCSW [phone] (coverage)

## 2016-06-10 NOTE — Progress Notes (Signed)
Speech Language Pathology Treatment: Cognitive-Linquistic  Patient Details Name: Yolanda James MRN: [id number] DOB: [birth year] Today's Date: 06/10/2016 Time: [id number] SLP Time Calculation (min) (ACUTE ONLY): 18 min  Assessment / Plan / Recommendation Clinical Impression  Treatment focused on cognitive abilities primarily memory and thought organization. Pt practiced using compensatory memory strategies and filling in calendar with important dates without cueing. She generated list of medication she takes and reports filling her pill box each Sunday. Pt is progressing with cognitive treatment; SNF for rehab soon then home is plan per pt. Continue treatment in hospital and at SNF for independence with ADL's once home due to living alone.     HPI HPI: 82 yo female adm to [facility] with slurred speech, weakness, left facial droop, left arm drift.  PMH + for ovarian cancer undergoing chemotherapy.  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   Speech and language evaluation ordered.       SLP Plan  Continue with current plan of care     Recommendations                   Oral Care Recommendations: Oral care QID Follow up Recommendations: 24 hour supervision/assistance;Skilled Nursing facility Plan: Continue with current plan of care       GO                Houston Siren 06/10/2016, 10:00 AM   Orbie Pyo Colvin Caroli.Ed [employer] [phone]

## 2016-06-10 NOTE — Progress Notes (Signed)
Occupational Therapy Treatment Patient Details Name: Yolanda James MRN: [id number] DOB: June 15, 1931 Today's Date: 06/10/2016    History of present illness 82 y.o.femalewith medical history significant ofovarian CA on chemotherapy, A. Fib on chronic coumadin presenting with slurred speech, facial droop, and L arm drift. 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   OT comments  Pt able to transfer to [facility] and then chair with RW and 2 person assist. Pt requiring total assist for pericare. Pt with decreased activity tolerance and weakness. Pt pleased to be OOB. Will need post acute rehab in SNF.  Follow Up Recommendations  SNF;Supervision/Assistance - 24 hour    Equipment Recommendations       Recommendations for Other Services      Precautions / Restrictions Precautions Precautions: Fall Restrictions Weight Bearing Restrictions: No       Mobility Bed Mobility Overal bed mobility: Needs Assistance Bed Mobility: Supine to Sit    Supine to sit: +2 for physical assistance;Mod assist     General bed mobility comments: assist for LEs to EOB and to raise trunk, assist to scoot hips to EOB with pad  Transfers Overall transfer level: Needs assistance Equipment used: Rolling walker (2 wheeled) Transfers: Sit to/from [facility] Sit to Stand: +2 physical assistance;Mod assist;Max assist Stand pivot transfers: +2 physical assistance;Min assist       General transfer comment: +2 mod from bed, max from [facility], used momentum    Balance     Sitting balance-Leahy Scale: Fair       Standing balance-Leahy Scale: Poor                     ADL Overall ADL's : Needs assistance/impaired                         Toilet Transfer: +2 for physical assistance;Minimal assistance;Stand-pivot;BSC;RW   Toileting-  Clothing Manipulation and Hygiene: +2 for physical assistance;Total assistance;Sit to/from stand         General ADL Comments: pt requesting to sit on [facility] prior to getting up to chair      Vision                     Perception     Praxis      Cognition   Behavior During Therapy: [facility] for tasks assessed/performed;Flat affect Overall Cognitive Status: Within Functional Limits for tasks assessed                       Extremity/Trunk Assessment               Exercises General Exercises - Lower Extremity Ankle Circles/Pumps: AROM;Both;15 reps;Supine   Shoulder Instructions       General Comments      Pertinent Vitals/ Pain       Pain Assessment: No/denies pain  Home Living                                          Prior Functioning/Environment              Frequency  Min 2X/week        Progress Toward Goals  OT Goals(current goals can now  be found in the care plan section)  Progress towards OT goals: Progressing toward goals  Acute Rehab OT Goals Patient Stated Goal: "I want to be able to go home" Time For Goal Achievement: 06/22/16 Potential to Achieve Goals: Good  Plan Discharge plan remains appropriate    Co-evaluation    PT/OT/SLP Co-Evaluation/Treatment: Yes Reason for Co-Treatment: For patient/therapist safety   OT goals addressed during session: ADL's and self-care      End of Session Equipment Utilized During Treatment: Rolling walker;Gait belt   Activity Tolerance Patient tolerated treatment well   Patient Left in chair;with call bell/phone within reach;with chair alarm set   Nurse Communication Mobility status        Time: 1403-1430 OT Time Calculation (min): 27 min  Charges: OT General Charges $OT Visit: 1 Procedure OT Treatments $Self Care/Home Management : 8-22 mins  Malka So 06/10/2016, 4:48 PM  [phone]

## 2016-06-10 NOTE — Progress Notes (Signed)
PROGRESS NOTE    Yolanda James  [id number] DOB: 15-Aug-1930 DOA: 06/07/2016 PCP: [name], MD   Brief Narrative: 82 year old female with history of ovarian cancer on chemotherapy, atrial fibrillation on chronic Coumadin, recently admitted for GI bleed, Pseudomonas bacteremia currently on oral antibiotics, discharged to rehabilitation presented with slurred speech and facial droop and left-sided weakness. Patient with acute right MCA ischemic infarct. Neurology evaluation ongoing.  Assessment & Plan:  #Acute/subacute right MCA territory ischemic infarction: The patient presented with slurred speech, facial droop and left-sided weakness. The symptoms are improved. Evaluated by stroke team. -Echo: LVEF 40-45 % -carotid ultrasound: unremarkable -EEG with no epileptiform activity. -On metoprolol and diltiazem with parameters. -Continue Lipitor -INR supratherapeutic, coumadin on hold. Neurologist recommended to start xarelto when INR <2 because of fluctuation of INR. INR 3.6  Today. Ordered a dose of vitamin K today because there is a plan to do abdomen paracentesis tomorrow morning. I discussed with the patient and her nurse today. -PT, OT and social worker evaluation. DC to SNF. -Speech and swallow evaluation.  # Chronic atrial fibrillation [facility]): Continue metoprolol, diltiazem with parameters. Pt has tendency to go to RVR. INR supratherapeutic. Ordered a dose of vitamin K. Repeat lab in am.  -I discontinue Coumadin, monitor INR daily. Start xarelto when INR is low and after the procedure.   #Hypokalemia: On potassium chloride pills. K 3.5 today.    #Ovarian cancer [facility]): On chemotherapy. Advised outpatient follow-up with oncologist. -Patient reported abdominal distention and requesting to do paracentesis. Abdominal paracentesis was ordered on 11/20. Currently she has high INR. Likely procedure tomorrow.  -resumed lasix with parameters.  #  Bacteremia due to Pseudomonas:  Recently discharged with ciprofloxacin. Continue till November 26.   #Type 2 diabetes: Continue to monitor blood sugar level. Continue sliding scale.  #Leukocytosis: On reviewing patient's record patient has chronic leukocytosis. It is likely related with underlying cancer. She might have received Neupogen after chemotherapy.    DVT prophylaxis: Systemic anticoagulation Code Status: Full code Family Communication: No family present at bedside today. Disposition Plan: Likely discharge to SNF/rehabilitation in 1-2 days.    Consultants:   Neurologist  Procedures: MRI Antimicrobials: Ciprofloxacin  Subjective: Patient was seen and examined at bedside. Continue to feel generalized weakness, abdominal distention, bloating. Patient wants abdominal paracentesis. Denied headache, dizziness, lightheadedness, chest pain, shortness of breath, nausea or vomiting. Objective: Vitals:   06/10/16 0143 06/10/16 0449 06/10/16 1018 06/10/16 1346  BP: 97/62 (!) 105/49 104/66 (!) 142/95  Pulse: 100 65 (!) 102 89  Resp: 18 20 20    Temp: 98.4 F (36.9 C) 98.4 F (36.9 C) 98.1 F (36.7 C) 98.8 F (37.1 C)  TempSrc: Oral Oral Oral Oral  SpO2: 98% 96% 100%   Weight:      Height:        Intake/Output Summary (Last 24 hours) at 06/10/16 1348 Last data filed at 06/10/16 1300  Gross per 24 hour  Intake             1200 ml  Output                0 ml  Net             1200 ml   Filed Weights   06/07/16 2324  Weight: 109.6 kg (241 lb 9.6 oz)    Examination:  General exam: Not in distress, lying comfortable. Respiratory system: Clear bilaterally, respiratory effort normal, no wheezing or crackle. Cardiovascular system: Regular rate rhythm, S1-S2  normal. Bilateral lower extremity trace edema. Gastrointestinal system: Abdomen soft, distended, nontender. Bowel sound positive. Central nervous system: Alert, awake, sensory exam normal. Extremities: Muscle strength 5 over 5 in all extremities.  Symmetric. Unchanged Skin: No rashes, lesions or ulcers Psychiatry: Judgement and insight appear normal. Mood & affect appropriate.     Data Reviewed: I have personally reviewed following labs and imaging studies  CBC:  Recent Labs Lab 06/04/16 0319 06/05/16 0327 06/07/16 1737 06/07/16 1756 06/09/16 0544  WBC 15.5* 20.1* 24.5*  --  19.5*  NEUTROABS 13.5* 17.5* 21.6*  --   --   HGB 10.3* 10.0* 10.8* 11.6* 9.9*  HCT 32.1* 31.2* 34.4* 34.0* 32.0*  MCV 77.2* 77.4* 78.7  --  82.1  PLT 91* 72* 122*  --  [id number]   Basic Metabolic Panel:  Recent Labs Lab 06/05/16 0327 06/07/16 1737 06/07/16 1756 06/08/16 0042 06/08/16 0939 06/09/16 0544 06/10/16 0314  NA 140 140 141  --  141 142 142  K 3.7 3.1* 3.3*  --  3.0* 3.3* 3.5  CL 110 109 106  --  109 111 109  CO2 21* 24  --   --  24 23 25   GLUCOSE 134* 85 85  --  88 85 98  BUN 26* 20 20  --  19 16 13   CREATININE 0.73 0.77 0.80  --  0.79 0.67 0.63  CALCIUM 7.9* 8.1*  --   --  8.0* 7.8* 7.8*  MG  --   --   --  1.7  --   --   --    GFR: Estimated Creatinine Clearance: 66.8 mL/min (by C-G formula based on SCr of 0.63 mg/dL). Liver Function Tests:  Recent Labs Lab 06/07/16 1737  AST 29  ALT 27  ALKPHOS 133*  BILITOT 0.7  PROT 5.1*  ALBUMIN 2.1*   No results for input(s): LIPASE, AMYLASE in the last 168 hours. No results for input(s): AMMONIA in the last 168 hours. Coagulation Profile:  Recent Labs Lab 06/05/16 0327 06/07/16 1737 06/08/16 0107 06/09/16 0544 06/10/16 0314  INR 2.16 3.50 3.26 3.46 3.60   Cardiac Enzymes: No results for input(s): CKTOTAL, CKMB, CKMBINDEX, TROPONINI in the last 168 hours. BNP (last 3 results) No results for input(s): PROBNP in the last 8760 hours. HbA1C:  Recent Labs  06/08/16 0107  HGBA1C 6.1*   CBG:  Recent Labs Lab 06/09/16 1108 06/09/16 1618 06/09/16 2110 06/10/16 0632 06/10/16 1127  GLUCAP 77 109* 118* 91 102*   Lipid Profile:  Recent Labs  06/08/16 0107    CHOL 110  HDL 44  LDLCALC 46  TRIG 102  CHOLHDL 2.5   Thyroid Function Tests: No results for input(s): TSH, T4TOTAL, FREET4, T3FREE, THYROIDAB in the last 72 hours. Anemia Panel: No results for input(s): VITAMINB12, FOLATE, FERRITIN, TIBC, IRON, RETICCTPCT in the last 72 hours. Sepsis Labs:  Recent Labs Lab 06/07/16 1758 06/07/16 2043  LATICACIDVEN 1.37 0.81    Recent Results (from the past 240 hour(s))  MRSA PCR Screening     Status: None   Collection Time: 05/31/16  2:34 PM  Result Value Ref Range Status   MRSA by PCR NEGATIVE NEGATIVE Final    Comment:        The GeneXpert MRSA Assay (FDA approved for NASAL specimens only), is one component of a comprehensive MRSA colonization surveillance program. It is not intended to diagnose MRSA infection nor to guide or monitor treatment for MRSA infections.   C difficile quick scan w PCR  reflex     Status: None   Collection Time: 05/31/16 11:30 PM  Result Value Ref Range Status   C Diff antigen NEGATIVE NEGATIVE Final   C Diff toxin NEGATIVE NEGATIVE Final   C Diff interpretation No C. difficile detected.  Final  Gastrointestinal Panel by PCR , Stool     Status: None   Collection Time: 05/31/16 11:30 PM  Result Value Ref Range Status   Campylobacter species NOT DETECTED NOT DETECTED Final   Plesimonas shigelloides NOT DETECTED NOT DETECTED Final   Salmonella species NOT DETECTED NOT DETECTED Final   Yersinia enterocolitica NOT DETECTED NOT DETECTED Final   Vibrio species NOT DETECTED NOT DETECTED Final   Vibrio cholerae NOT DETECTED NOT DETECTED Final   Enteroaggregative E coli (EAEC) NOT DETECTED NOT DETECTED Final   Enteropathogenic E coli (EPEC) NOT DETECTED NOT DETECTED Final   Enterotoxigenic E coli (ETEC) NOT DETECTED NOT DETECTED Final   Shiga like toxin producing E coli (STEC) NOT DETECTED NOT DETECTED Final   Shigella/Enteroinvasive E coli (EIEC) NOT DETECTED NOT DETECTED Final   Cryptosporidium NOT DETECTED  NOT DETECTED Final   Cyclospora cayetanensis NOT DETECTED NOT DETECTED Final   Entamoeba histolytica NOT DETECTED NOT DETECTED Final   Giardia lamblia NOT DETECTED NOT DETECTED Final   Adenovirus F40/41 NOT DETECTED NOT DETECTED Final   Astrovirus NOT DETECTED NOT DETECTED Final   Norovirus GI/GII NOT DETECTED NOT DETECTED Final   Rotavirus A NOT DETECTED NOT DETECTED Final   Sapovirus (I, II, IV, and V) NOT DETECTED NOT DETECTED Final  Blood culture (routine x 2)     Status: None (Preliminary result)   Collection Time: 06/08/16 12:39 AM  Result Value Ref Range Status   Specimen Description BLOOD LEFT ARM  Final   Special Requests BOTTLES DRAWN AEROBIC AND ANAEROBIC 5CC EA  Final   Culture NO GROWTH 2 DAYS  Final   Report Status PENDING  Incomplete  Blood culture (routine x 2)     Status: None (Preliminary result)   Collection Time: 06/08/16 12:44 AM  Result Value Ref Range Status   Specimen Description BLOOD RIGHT ANTECUBITAL  Final   Special Requests BOTTLES DRAWN AEROBIC ONLY [place]  Final   Culture NO GROWTH 2 DAYS  Final   Report Status PENDING  Incomplete         Radiology Studies: No results found.      Scheduled Meds: . atorvastatin  80 mg Oral q1800  . ciprofloxacin  750 mg Oral BID  . diltiazem  240 mg Oral Daily  . dorzolamide  1 drop Both Eyes BID  . furosemide  40 mg Oral Daily  . insulin aspart  0-5 Units Subcutaneous QHS  . insulin aspart  0-9 Units Subcutaneous TID WC  . latanoprost  1 drop Both Eyes QHS  . metoprolol  25 mg Oral BID  . phytonadione (VITAMIN K) IV  5 mg Intravenous Once  . potassium chloride  40 mEq Oral Daily  . timolol  1 drop Both Eyes BID   Continuous Infusions:   LOS: 3 days    Lyra Alaimo Tanna Furry, MD [facility] Pager [phone]  If 7PM-7AM, please contact night-coverage [email or web address] Password [email or web address] 06/10/2016, 1:48 PM

## 2016-06-11 ENCOUNTER — Inpatient Hospital Stay (HOSPITAL_COMMUNITY): Payer: Medicare Other

## 2016-06-11 DIAGNOSIS — I63411 Cerebral infarction due to embolism of right middle cerebral artery: Principal | ICD-10-CM

## 2016-06-11 LAB — GLUCOSE, CAPILLARY
GLUCOSE-CAPILLARY: 113 mg/dL — AB (ref 65–99)
GLUCOSE-CAPILLARY: 83 mg/dL (ref 65–99)
Glucose-Capillary: 90 mg/dL (ref 65–99)

## 2016-06-11 LAB — PROTIME-INR
INR: 1.41
PROTHROMBIN TIME: 17.4 s — AB (ref 11.4–15.2)

## 2016-06-11 MED ORDER — RIVAROXABAN 20 MG PO TABS: 20.0000 mg | ORAL_TABLET | Freq: Every day | ORAL | Status: AC

## 2016-06-11 MED ORDER — CIPROFLOXACIN HCL 750 MG PO TABS: 750.0000 mg | ORAL_TABLET | Freq: Two times a day (BID) | ORAL | 0 refills | Status: DC

## 2016-06-11 MED ORDER — RIVAROXABAN 20 MG PO TABS
20.0000 mg | ORAL_TABLET | Freq: Every day | ORAL | Status: DC
  Administered 2016-06-11: 20 mg via ORAL
  Filled 2016-06-11: qty 1

## 2016-06-11 NOTE — Clinical Social Work Placement (Signed)
   CLINICAL SOCIAL WORK PLACEMENT  NOTE  Date:  06/11/2016  Patient Details  Name: AAJAH UHDE MRN: [id number] Date of Birth: September 01, 1930  Clinical Social Work is seeking post-discharge placement for this patient at the [place] level of care (*CSW will initial, date and re-position this form in  chart as items are completed):      Patient/family provided with [place] Work Department's list of facilities offering this level of care within the geographic area requested by the patient (or if unable, by the patient's family).  Yes   Patient/family informed of their freedom to choose among providers that offer the needed level of care, that participate in Medicare, Medicaid or managed care program needed by the patient, have an available bed and are willing to accept the patient.      Patient/family informed of [place]'s ownership interest in [facility] and [facility], as well as of the fact that they are under no obligation to receive care at these facilities.  PASRR submitted to EDS on       PASRR number received on       Existing PASRR number confirmed on 06/10/16     FL2 transmitted to all facilities in geographic area requested by pt/family on 06/10/16     FL2 transmitted to all facilities within larger geographic area on       Patient informed that his/her managed care company has contracts with or will negotiate with certain facilities, including the following:        Yes   Patient/family informed of bed offers received.  Patient chooses bed at [facility]     Physician recommends and patient chooses bed at      Patient to be transferred to [facility] on  .  Patient to be transferred to facility by [facility]     Patient family notified on 06/11/16 of transfer.  Name of family member notified:        PHYSICIAN Please prepare prescriptions, Please prepare priority discharge summary, including medications      Additional Comment:    _______________________________________________ Alla German, LCSW 06/11/2016, 3:01 PM

## 2016-06-11 NOTE — Progress Notes (Signed)
ANTICOAGULATION CONSULT NOTE - Initial Consult  Pharmacy Consult for Coumadin to Xarelto Indication: atrial fibrillation / CVA  Allergies  Allergen Reactions  . Orange Fruit [Citrus]     Patient Measurements: Height: 5\' 6"  (167.6 cm) Weight: 241 lb 9.6 oz (109.6 kg) IBW/kg (Calculated) : 59.3  Vital Signs: Temp: 98.2 F (36.8 C) (11/22 1034) Temp Source: Oral (11/22 1034) BP: 114/57 (11/22 1034) Pulse Rate: 106 (11/22 1034)  Labs:  Recent Labs  06/09/16 0544 06/10/16 0314 06/11/16 0925  HGB 9.9*  --   --   HCT 32.0*  --   --   PLT 130*  --   --   LABPROT 35.6* 36.8* 17.4*  INR 3.46 3.60 1.41  CREATININE 0.67 0.63  --     Estimated Creatinine Clearance: 66.8 mL/min (by C-G formula based on SCr of 0.63 mg/dL).   Medical History: Past Medical History:  Diagnosis Date  . Acute blood loss anemia   . Chronic a-fib ([place])    a. DCCV 02/05/2007. b. sotalol stopped 2012. c. managed with rate control.  . Chronic diastolic CHF (congestive heart failure) ([facility])    associated with AFIB RVR  . Essential hypertension   . Glaucoma   . Hypokalemia   . Long term current use of anticoagulant therapy   . Osteoarthritis    hands and knees, back & knees & "all over"   . Pure hypercholesterolemia   . Slow transit constipation   . Type 2 diabetes mellitus without complication, with long-term current use of insulin [facility])     Assessment: 82 year old female previously on Coumadin for Afib, now transitioning to Xarelto. INR has been reversed and is now 1.41 CrCl > 50 ml/min  Goal of Therapy:   Monitor platelets by anticoagulation protocol: Yes   Plan:  Xarelto 20 mg po daily Pharmacy to sign off and follow peripherally for renal changes  Thank you Anette Guarneri, PharmD [phone]  06/11/2016,2:14 PM

## 2016-06-11 NOTE — Progress Notes (Signed)
US abdomen for ascites/paracentesis. Only trace to small volume of ascites. Procedure was not performed. See Imaging report.  Ascencion Dike PA-C Interventional Radiology 06/11/2016 3:20 PM

## 2016-06-11 NOTE — Clinical Social Work Note (Signed)
Clinical Social Worker facilitated patient discharge including contacting patient family and facility to confirm patient discharge plans.  Clinical information faxed to facility and family agreeable with plan.  CSW arranged ambulance transport via [facility] to [facility].  RN to call report prior to discharge.  Clinical Social Worker will sign off for now as social work intervention is no longer needed. Please consult us again if new need arises.  Jesse Scinto, LCSW [phone] 

## 2016-06-11 NOTE — Progress Notes (Addendum)
Patient returned to room from paracentesis. RN called radiology department for update of procedure. Staff will relay information to PA that perform procedure to call RN at this time. Awaiting for call back prior to d/c pt.   Ave Filter, RN

## 2016-06-11 NOTE — Progress Notes (Signed)
Report given to [facility] from [facility] at [facility]. Awaiting for transport. Patient's son notified of disposition.   Ave Filter, RN

## 2016-06-11 NOTE — Discharge Summary (Addendum)
Physician Discharge Summary  Yolanda James [id number] DOB: [birth year] DOA: 06/07/2016  PCP: [name], MD  Admit date: 06/07/2016 Discharge date: 06/11/2016  Time spent: 45 minutes  Recommendations for Outpatient Follow-up:  1. Stop Ciprofloxacin on 11/26 for recent bacteremia 2. Stopped Warfarin and started on Xarelto for Afib/CVA 3. FU with Dr.SHerril for Ovarian cancer management   Discharge Diagnoses:    Acute/subacute right MCA territory ischemic infarction   Chronic atrial fibrillation ([place])   Ovarian cancer ([place])   Bacteremia due to Pseudomonas   Left-sided weakness   Facial droop   CVA (cerebral vascular accident) ([place])   Hypokalemia   Abdominal distention   Leukocytosis   Dysarthria due to cerebrovascular accident (CVA) ([place])   Ascites   Third spacing  Discharge Condition: stable  Diet recommendation: low sodium, heart healthy  Filed Weights   06/07/16 2324  Weight: 109.6 kg (241 lb 9.6 oz)    History of present illness:  82 year old female with history of ovarian cancer on chemotherapy, atrial fibrillation on chronic Coumadin, recently admitted for GI bleed, Pseudomonas bacteremia currently on oral antibiotics, discharged to rehabilitation presented with slurred speech and facial droop and left-sided weakness. Patient with acute right MCA ischemic infarct   Hospital Course:  Acute/subacute right MCA territory ischemic infarction: The patient presented with slurred speech, facial droop and left-sided weakness.  - symptoms are much improved. -NEurology consulted -Echo: LVEF 40-45 %, carotid ultrasound: unremarkable -EEG with no epileptiform activity. -LDL 46 and Hba1c: 6.1 -was on warfarin with INR of 3.6 on admission and prior admission with INR of 10 -Due to fluctuations in INR,  Neurology recommended to start xarelto when INR <2  -warfarin was held, for planned paracentesis -attempted but coudlnt be done -subsequently started on Xarelto at  20mg  daily -PT, OT and social worker evaluation completed-SNFrecommended  # Chronic atrial fibrillation [facility]): Continue metoprolol, diltiazem with parameters. -see discussion above, changed from warfarin to xarelto  #Hypokalemia:  -replaced    #Ovarian cancer [facility]): On chemotherapy. Advised outpatient follow-up with oncologist Dr.Sherril -Patient reported abdominal distention and requesting to do paracentesis. Abdominal paracentesis was attempted but only had minimal ascites on [place] hence paracentesis was not done   #  Bacteremia due to Pseudomonas -from last/recent admission, discharged with ciprofloxacin. Continue till November 26.   #Type 2 diabetes:  -resumed metformin  #Leukocytosis: On reviewing patient's record patient has chronic leukocytosis. It is likely related with underlying cancer.    Procedures:  US-Paracentesis 11/22  Consultations:  Neurology  Discharge Exam: Vitals:   06/11/16 0500 06/11/16 1034  BP: 114/78 (!) 114/57  Pulse: (!) 58 (!) 106  Resp: 17 20  Temp: 98.8 F (37.1 C) 98.2 F (36.8 C)    General: AAOx3 Cardiovascular: S1S2/RRR Respiratory: CTAB  Discharge Instructions   Discharge Instructions    Diet - low sodium heart healthy    Complete by:  As directed    Diet Carb Modified    Complete by:  As directed    Increase activity slowly    Complete by:  As directed      Current Discharge Medication List    START taking these medications   Details  rivaroxaban (XARELTO) 20 MG TABS tablet Take 1 tablet (20 mg total) by mouth daily with supper. Qty: 30 tablet      CONTINUE these medications which have CHANGED   Details  ciprofloxacin (CIPRO) 750 MG tablet Take 1 tablet (750 mg total) by mouth 2 (two) times daily. Refills: 0  CONTINUE these medications which have NOT CHANGED   Details  atorvastatin (LIPITOR) 80 MG tablet TAKE 1 TABLET(80 MG) BY MOUTH DAILY Qty: 90 tablet, Refills: 3    Coenzyme Q10 (CO Q 10) 100 MG  CAPS Take 1 capsule by mouth daily.    diltiazem (CARDIZEM CD) 240 MG 24 hr capsule Take 1 capsule (240 mg total) by mouth daily. Qty: 90 capsule, Refills: 3   Associated Diagnoses: Essential hypertension; Atrial fibrillation, unspecified type ([place]); Diastolic congestive heart failure, unspecified congestive heart failure chronicity ([place]); Bilateral edema of lower extremity; Bilateral lower extremity edema    dorzolamide (TRUSOPT) 2 % ophthalmic solution Place 1 drop into both eyes 2 (two) times daily.     furosemide (LASIX) 40 MG tablet Take 1 tablet daily. May take additional dose once weekly for swelling. Qty: 105 tablet, Refills: 3    glipiZIDE (GLUCOTROL XL) 2.5 MG 24 hr tablet Take 2.5 mg by mouth 2 (two) times daily.     LUMIGAN 0.01 % SOLN Place 1 drop into both eyes at bedtime.     metFORMIN (GLUCOPHAGE) 500 MG tablet Take 500 mg by mouth 2 (two) times daily with a meal.     metoprolol tartrate (LOPRESSOR) 50 MG tablet Take 1 tablet (50 mg total) by mouth 2 (two) times daily. Qty: 60 tablet, Refills: 0    Omega-3 Fatty Acids (FISH OIL) 1000 MG CAPS Take 1,000 mg by mouth 2 (two) times daily.    ondansetron (ZOFRAN) 8 MG tablet Take 1 tablet (8 mg total) by mouth every 8 (eight) hours as needed for nausea or vomiting. Qty: 20 tablet, Refills: 1    pilocarpine (PILOCAR) 4 % ophthalmic solution Place 1 drop into both eyes 4 (four) times daily.     polyethylene glycol (MIRALAX / GLYCOLAX) packet Take 17 g by mouth daily as needed.     potassium chloride (MICRO-K) 10 MEQ CR capsule Take 10 mEq by mouth daily.  Refills: 1    timolol (TIMOPTIC) 0.5 % ophthalmic solution Place 1 drop into both eyes 2 (two) times daily.       STOP taking these medications     warfarin (COUMADIN) 3 MG tablet        Allergies  Allergen Reactions  . Orange Fruit [Citrus]    Follow-up Information    Xu,Jindong, MD. Schedule an appointment as soon as possible for a visit in 6 week(s).    Specialty:  Neurology Contact information: [address] [place] [address] [phone]            The results of significant diagnostics from this hospitalization (including imaging, microbiology, ancillary and laboratory) are listed below for reference.    Significant Diagnostic Studies: Ct Abdomen Pelvis Wo Contrast  Result Date: 06/03/2016 CLINICAL DATA:  Drop in hemoglobin, evaluate for retroperitoneal bleed EXAM: CT ABDOMEN AND PELVIS WITHOUT CONTRAST TECHNIQUE: Multidetector CT imaging of the abdomen and pelvis was performed following the standard protocol without IV contrast. COMPARISON:  05/07/2016 FINDINGS: Lower chest: Moderate left pleural effusion. Small right pleural effusion. Hepatobiliary: Diminutive liver size with moderate perihepatic ascites. No focal gallbladder abnormality. Pancreas: Unremarkable. No pancreatic ductal dilatation or surrounding inflammatory changes. Spleen: Normal in size without focal abnormality. Adrenals/Urinary Tract: Adrenal glands are unremarkable. Kidneys are normal, without renal calculi, focal lesion, or hydronephrosis. Bladder is unremarkable. Stomach/Bowel: Stomach is within normal limits. Appendix appears normal. No evidence of bowel wall thickening, distention, or inflammatory changes. Vascular/Lymphatic: Normal caliber abdominal aorta. Abdominal aortic  atherosclerosis. No lymphadenopathy. Reproductive: Soft tissue density seen in the cul-de-sac of indeterminate etiology. Other: Small amount of abdominal and pelvic ascites. Heterogeneous masslike area in the left anterior omentum measuring approximately 4 x 2.5 cm better seen on prior abdominal CT dated 05/07/2016 concerning for malignancy. Musculoskeletal: Right total hip arthroplasty without failure complication. Generalized osteopenia. Degenerative disc disease with disc height loss at L4-5 and L5-S1. Grade 1 anterolisthesis of L5 on S1 with bilateral facet arthropathy.  IMPRESSION: 1. Heterogeneous masslike area in the left anterior omentum measuring approximately 4 x 2.5 cm better seen on prior abdominal CT dated 05/07/2016 concerning for malignancy. Small amount of abdominal and pelvic ascites. 2. Moderate left and small right pleural effusion. 3.  Aortic Atherosclerosis (ICD10-170.0) Electronically Signed   By: Kathreen Devoid   On: 06/03/2016 11:16   Dg Chest 2 View  Result Date: 05/31/2016 CLINICAL DATA:  Pt states she is "wore out". Recent chemo treatment for ovarian cancer. Denies any chest complaints. H/o CHF, Chronic a-fib, and type 2 diabetes. Former smoker. EXAM: CHEST  2 VIEW COMPARISON:  08/27/2015 FINDINGS: Small left pleural effusion. No focal consolidation. No pneumothorax. Stable cardiomediastinal silhouette. No acute osseous abnormality. IMPRESSION: Small left pleural effusion. Electronically Signed   By: Kathreen Devoid   On: 05/31/2016 13:19   Ct Head Wo Contrast  Result Date: 06/07/2016 CLINICAL DATA:  Altered mental status EXAM: CT HEAD WITHOUT CONTRAST TECHNIQUE: Contiguous axial images were obtained from the base of the skull through the vertex without intravenous contrast. COMPARISON:  None. FINDINGS: Brain: No evidence of acute infarction, hemorrhage, extra-axial collection, ventriculomegaly, or mass effect. Periventricular white matter low attenuation likely secondary to microangiopathy. Vascular: Cerebrovascular atherosclerotic calcifications are noted. Skull: Negative for fracture or focal lesion. Sinuses/Orbits: Visualized portions of the orbits are unremarkable. Visualized portions of the paranasal sinuses and mastoid air cells are unremarkable. Other: None. IMPRESSION: No acute intracranial pathology. Electronically Signed   By: Kathreen Devoid   On: 06/07/2016 20:39   Mr Brain W Wo Contrast  Result Date: 06/08/2016 CLINICAL DATA:  Left facial droop, left hemi paresis and dysarthria EXAM: MRI HEAD WITHOUT CONTRAST MRA HEAD WITHOUT CONTRAST  TECHNIQUE: Multiplanar, multiecho pulse sequences of the brain and surrounding structures were obtained without intravenous contrast. Angiographic images of the head were obtained using MRA technique without contrast. COMPARISON:  CT head 06/07/2016. FINDINGS: MRI HEAD FINDINGS Brain: There are high values on diffusion-weighted imaging within the tail of the right caudate nucleus and along the lateral right frontal cortex of, insula and anterior right temporal lobe. ADC values are mildly decreased but less so than would be expected in the acute phase. The midline structures are normal. There is associated hyperintense T2 weighted signal in the distribution corresponding to the above-described diffusion abnormalities. No mass lesion or midline shift. No hydrocephalus or extra-axial fluid collection. No age advanced or lobar predominant atrophy. Vascular: Major intracranial arterial and venous sinus flow voids are preserved. No evidence of chronic microhemorrhage or amyloid angiopathy. Skull and upper cervical spine: The visualized skull base, calvarium, upper cervical spine and extracranial soft tissues are normal. Sinuses/Orbits: No fluid levels or advanced mucosal thickening. No mastoid effusion. Normal orbits. MRA HEAD FINDINGS Intracranial internal carotid arteries: Normal. Anterior cerebral arteries: Absence of the right A1 segment is a normal variant. Otherwise normal. Middle cerebral arteries: Normal. Posterior communicating arteries: Present bilaterally. Posterior cerebral arteries: Normal. Basilar artery: Normal. Vertebral arteries: Codominant. Normal. Superior cerebellar arteries: Normal. Anterior inferior cerebellar arteries: Normal. Posterior inferior  cerebellar arteries: Normal. IMPRESSION: 1. Areas of mild diffusion restriction within the right MCA territory, including the right frontal cortex, right insula and anterior right temporal cortex as well as the tail of the right caudate nucleus. ADC values  are higher than typically seen in the acute setting, but this is still favored to indicate late acute to early subacute ischemic infarct. 2. No hemorrhage or mass effect. These results will be called to the ordering clinician or representative by the Radiologist Assistant, and communication documented in the PACS or zVision Dashboard. Electronically Signed   By: Ulyses Jarred M.D.   On: 06/08/2016 03:45   US Abdomen Complete  Result Date: 06/01/2016 CLINICAL DATA:  Patient with diffuse abdominal pain for 3 days. EXAM: ABDOMEN ULTRASOUND COMPLETE COMPARISON:  CT abdomen pelvis 05/07/2016. FINDINGS: Gallbladder: No gallstones or wall thickening visualized. No sonographic Murphy sign noted by sonographer. Common bile duct: Diameter: 5 mm Liver: No focal lesion identified. Within normal limits in parenchymal echogenicity. IVC: No abnormality visualized. Pancreas: Visualized portion unremarkable. Spleen: Size and appearance within normal limits. Right Kidney: Length: 10.6 cm. Echogenicity within normal limits. No mass or hydronephrosis visualized. Left Kidney: Length: 8.4 cm. Echogenicity within normal limits. No mass or hydronephrosis visualized. Abdominal aorta: No aneurysm visualized. Other findings: Small amount of ascites. IMPRESSION: Small amount of ascites. No acute process within the abdomen. Electronically Signed   By: Lovey Newcomer M.D.   On: 06/01/2016 13:36   US Abdomen Limited  Result Date: 06/11/2016 CLINICAL DATA:  Ovarian cancer with history of ascites. Abdominal distention. Request evaluation for possible paracentesis. EXAM: LIMITED ABDOMINAL ULTRASOUND COMPARISON:  None. FINDINGS: Limited ultrasound of the abdomen finds trace to small volume of ascites. This is insufficient for safe paracentesis. Procedure not performed. IMPRESSION: Trace to small volume of ascites. Read by: Ascencion Dike PA-C Electronically Signed   By: Jacqulynn Cadet M.D.   On: 06/11/2016 13:55   US Paracentesis  Result  Date: 05/28/2016 INDICATION: Patient with history of GYN malignancy, probable ovarian origin, recurrent ascites. Request made for therapeutic paracentesis. EXAM: ULTRASOUND GUIDED THERAPEUTIC PARACENTESIS MEDICATIONS: None. COMPLICATIONS: None immediate. PROCEDURE: Informed written consent was obtained from the patient after a discussion of the risks, benefits and alternatives to treatment. A timeout was performed prior to the initiation of the procedure. Initial ultrasound scanning demonstrates a small amount of ascites within the right lower abdominal quadrant. The right lower abdomen was prepped and draped in the usual sterile fashion. 1% lidocaine was used for local anesthesia. Following this, a Yueh catheter was introduced. An ultrasound image was saved for documentation purposes. The paracentesis was performed. The catheter was removed and a dressing was applied. The patient tolerated the procedure well without immediate post procedural complication. FINDINGS: A total of approximately 1.5 liters of slightly hazy, amber fluid was removed. IMPRESSION: Successful ultrasound-guided therapeutic paracentesis yielding 1.5 liters of peritoneal fluid. Read by: Rowe Robert, PA-C Electronically Signed   By: Jerilynn Mages.  Shick M.D.   On: 05/28/2016 12:36   US Paracentesis  Result Date: 05/13/2016 INDICATION: Abdominal distention. Ascites. Newly found omental caking concerning for malignant process. Request diagnostic and therapeutic paracentesis. EXAM: ULTRASOUND GUIDED LEFT LOWER QUADRANT PARACENTESIS MEDICATIONS: None. COMPLICATIONS: None immediate. PROCEDURE: Informed written consent was obtained from the patient after a discussion of the risks, benefits and alternatives to treatment. A timeout was performed prior to the initiation of the procedure. Initial ultrasound scanning demonstrates a large amount of ascites within the left lower abdominal quadrant. The left lower abdomen  was prepped and draped in the usual sterile  fashion. 1% lidocaine with epinephrine was used for local anesthesia. Following this, a Safe-T-Centesis catheter was introduced. An ultrasound image was saved for documentation purposes. The paracentesis was performed. The catheter was removed and a dressing was applied. The patient tolerated the procedure well without immediate post procedural complication. FINDINGS: A total of approximately 2.1 L of clear yellow fluid was removed. Samples were sent to the laboratory as requested by the clinical team. IMPRESSION: Successful ultrasound-guided paracentesis yielding 2.1 liters of peritoneal fluid. Read by: Ascencion Dike PA-C Electronically Signed   By: Aletta Edouard M.D.   On: 05/13/2016 14:50   Dg Chest Port 1 View  Result Date: 06/07/2016 CLINICAL DATA:  Left-sided weakness.  Pitting edema. EXAM: PORTABLE CHEST 1 VIEW COMPARISON:  May 31, 2016 FINDINGS: There is a small left effusion with underlying opacity, unchanged. No other interval changes or acute abnormalities. IMPRESSION: Stable left pleural effusion with underlying opacity. No other change. Electronically Signed   By: Dorise Bullion III M.D   On: 06/07/2016 17:55   Mr Jodene Nam Head/brain Wo Cm  Result Date: 06/08/2016 CLINICAL DATA:  Left facial droop, left hemi paresis and dysarthria EXAM: MRI HEAD WITHOUT CONTRAST MRA HEAD WITHOUT CONTRAST TECHNIQUE: Multiplanar, multiecho pulse sequences of the brain and surrounding structures were obtained without intravenous contrast. Angiographic images of the head were obtained using MRA technique without contrast. COMPARISON:  CT head 06/07/2016. FINDINGS: MRI HEAD FINDINGS Brain: There are high values on diffusion-weighted imaging within the tail of the right caudate nucleus and along the lateral right frontal cortex of, insula and anterior right temporal lobe. ADC values are mildly decreased but less so than would be expected in the acute phase. The midline structures are normal. There is associated  hyperintense T2 weighted signal in the distribution corresponding to the above-described diffusion abnormalities. No mass lesion or midline shift. No hydrocephalus or extra-axial fluid collection. No age advanced or lobar predominant atrophy. Vascular: Major intracranial arterial and venous sinus flow voids are preserved. No evidence of chronic microhemorrhage or amyloid angiopathy. Skull and upper cervical spine: The visualized skull base, calvarium, upper cervical spine and extracranial soft tissues are normal. Sinuses/Orbits: No fluid levels or advanced mucosal thickening. No mastoid effusion. Normal orbits. MRA HEAD FINDINGS Intracranial internal carotid arteries: Normal. Anterior cerebral arteries: Absence of the right A1 segment is a normal variant. Otherwise normal. Middle cerebral arteries: Normal. Posterior communicating arteries: Present bilaterally. Posterior cerebral arteries: Normal. Basilar artery: Normal. Vertebral arteries: Codominant. Normal. Superior cerebellar arteries: Normal. Anterior inferior cerebellar arteries: Normal. Posterior inferior cerebellar arteries: Normal. IMPRESSION: 1. Areas of mild diffusion restriction within the right MCA territory, including the right frontal cortex, right insula and anterior right temporal cortex as well as the tail of the right caudate nucleus. ADC values are higher than typically seen in the acute setting, but this is still favored to indicate late acute to early subacute ischemic infarct. 2. No hemorrhage or mass effect. These results will be called to the ordering clinician or representative by the Radiologist Assistant, and communication documented in the PACS or zVision Dashboard. Electronically Signed   By: Ulyses Jarred M.D.   On: 06/08/2016 03:45    Microbiology: Recent Results (from the past 240 hour(s))  Blood culture (routine x 2)     Status: None (Preliminary result)   Collection Time: 06/08/16 12:39 AM  Result Value Ref Range Status    Specimen Description BLOOD LEFT ARM  Final  Special Requests BOTTLES DRAWN AEROBIC AND ANAEROBIC 5CC EA  Final   Culture NO GROWTH 2 DAYS  Final   Report Status PENDING  Incomplete  Blood culture (routine x 2)     Status: None (Preliminary result)   Collection Time: 06/08/16 12:44 AM  Result Value Ref Range Status   Specimen Description BLOOD RIGHT ANTECUBITAL  Final   Special Requests BOTTLES DRAWN AEROBIC ONLY [facility]  Final   Culture NO GROWTH 2 DAYS  Final   Report Status PENDING  Incomplete     Labs: Basic Metabolic Panel:  Recent Labs Lab 06/05/16 0327 06/07/16 1737 06/07/16 1756 06/08/16 0042 06/08/16 0939 06/09/16 0544 06/10/16 0314  NA 140 140 141  --  141 142 142  K 3.7 3.1* 3.3*  --  3.0* 3.3* 3.5  CL 110 109 106  --  109 111 109  CO2 21* 24  --   --  24 23 25   GLUCOSE 134* 85 85  --  88 85 98  BUN 26* 20 20  --  19 16 13   CREATININE 0.73 0.77 0.80  --  0.79 0.67 0.63  CALCIUM 7.9* 8.1*  --   --  8.0* 7.8* 7.8*  MG  --   --   --  1.7  --   --   --    Liver Function Tests:  Recent Labs Lab 06/07/16 1737  AST 29  ALT 27  ALKPHOS 133*  BILITOT 0.7  PROT 5.1*  ALBUMIN 2.1*   No results for input(s): LIPASE, AMYLASE in the last 168 hours. No results for input(s): AMMONIA in the last 168 hours. CBC:  Recent Labs Lab 06/05/16 0327 06/07/16 1737 06/07/16 1756 06/09/16 0544  WBC 20.1* 24.5*  --  19.5*  NEUTROABS 17.5* 21.6*  --   --   HGB 10.0* 10.8* 11.6* 9.9*  HCT 31.2* 34.4* 34.0* 32.0*  MCV 77.4* 78.7  --  82.1  PLT 72* 122*  --  130*   Cardiac Enzymes: No results for input(s): CKTOTAL, CKMB, CKMBINDEX, TROPONINI in the last 168 hours. BNP: BNP (last 3 results)  Recent Labs  11/26/15 1450 06/07/16 1737  BNP 95.0 369.6*    ProBNP (last 3 results) No results for input(s): PROBNP in the last 8760 hours.  CBG:  Recent Labs Lab 06/10/16 1127 06/10/16 1619 06/10/16 2229 06/11/16 0646 06/11/16 1109  GLUCAP 102* 105* 101* 113* 90        Signed:  Nikolus Marczak MD.  [facility] 06/11/2016, 2:19 PM

## 2016-06-12 NOTE — Progress Notes (Signed)
Report to [name], RN, to assume care of patient at this time.

## 2016-06-13 ENCOUNTER — Other Ambulatory Visit: Payer: Medicare Other

## 2016-06-13 ENCOUNTER — Telehealth: Payer: Self-pay | Admitting: *Deleted

## 2016-06-13 ENCOUNTER — Ambulatory Visit: Payer: Medicare Other

## 2016-06-13 LAB — CULTURE, BLOOD (ROUTINE X 2)
Culture: NO GROWTH
Culture: NO GROWTH

## 2016-06-13 NOTE — Telephone Encounter (Signed)
Call placed to patient's daughter Darla to check on patients status.  Patient is currently in [place] per pt.'s daughter and will not be in for scheduled appts today at [facility].  Will cancel appts today per Dr. Benay Spice.  Message sent to scheduling to re-schedule appts for pt on 06/17/16 per Dr. Benay Spice.    Pt.'s daughter notified and will call [place] on 06/16/16 if she feels her mother will not be able to make appointments for 06/17/16.

## 2016-06-16 ENCOUNTER — Non-Acute Institutional Stay (SKILLED_NURSING_FACILITY): Payer: Medicare Other | Admitting: Adult Health

## 2016-06-16 ENCOUNTER — Encounter: Payer: Self-pay | Admitting: Adult Health

## 2016-06-16 ENCOUNTER — Telehealth: Payer: Self-pay | Admitting: *Deleted

## 2016-06-16 DIAGNOSIS — H409 Unspecified glaucoma: Secondary | ICD-10-CM | POA: Diagnosis not present

## 2016-06-16 DIAGNOSIS — R601 Generalized edema: Secondary | ICD-10-CM

## 2016-06-16 DIAGNOSIS — R531 Weakness: Secondary | ICD-10-CM

## 2016-06-16 DIAGNOSIS — D72829 Elevated white blood cell count, unspecified: Secondary | ICD-10-CM

## 2016-06-16 DIAGNOSIS — D696 Thrombocytopenia, unspecified: Secondary | ICD-10-CM | POA: Diagnosis not present

## 2016-06-16 DIAGNOSIS — I4891 Unspecified atrial fibrillation: Secondary | ICD-10-CM

## 2016-06-16 DIAGNOSIS — B965 Pseudomonas (aeruginosa) (mallei) (pseudomallei) as the cause of diseases classified elsewhere: Secondary | ICD-10-CM

## 2016-06-16 DIAGNOSIS — I63411 Cerebral infarction due to embolism of right middle cerebral artery: Secondary | ICD-10-CM | POA: Diagnosis not present

## 2016-06-16 DIAGNOSIS — D62 Acute posthemorrhagic anemia: Secondary | ICD-10-CM

## 2016-06-16 DIAGNOSIS — E119 Type 2 diabetes mellitus without complications: Secondary | ICD-10-CM

## 2016-06-16 DIAGNOSIS — K922 Gastrointestinal hemorrhage, unspecified: Secondary | ICD-10-CM

## 2016-06-16 DIAGNOSIS — R7881 Bacteremia: Secondary | ICD-10-CM

## 2016-06-16 DIAGNOSIS — E785 Hyperlipidemia, unspecified: Secondary | ICD-10-CM | POA: Diagnosis not present

## 2016-06-16 DIAGNOSIS — E876 Hypokalemia: Secondary | ICD-10-CM | POA: Diagnosis not present

## 2016-06-16 DIAGNOSIS — K5901 Slow transit constipation: Secondary | ICD-10-CM

## 2016-06-16 DIAGNOSIS — E43 Unspecified severe protein-calorie malnutrition: Secondary | ICD-10-CM

## 2016-06-16 DIAGNOSIS — C569 Malignant neoplasm of unspecified ovary: Secondary | ICD-10-CM | POA: Diagnosis not present

## 2016-06-16 NOTE — Telephone Encounter (Signed)
Spoke with pt's daughter reporting she does want to come in on 11/28 to have pt evaluated by Dr. Benay Spice. She is at [facility] since being discharged from the hospital. She reports pt has also had a stroke.  Informed her of appt times. Daughter will coordinate with [facility] for transportation.

## 2016-06-16 NOTE — Progress Notes (Signed)
DATE:  06/16/2016    MRN:  [id number]  BIRTHDAY: Apr 03, 1931  Facility:  Nursing Home Location:  [place] Room Number: [id number]  LEVEL OF CARE:  SNF [phone])  Contact Information    Name Relation Home Work Mobile   Evans,Darla Daughter [phone]     [place], [place]   [phone]   [facility] Sister [phone]         Code Status History    Date Active Date Inactive Code Status Order ID Comments User Context   06/07/2016 11:05 PM 06/11/2016 10:08 PM Full Code [id number]  Geradine Girt, DO Inpatient   05/31/2016  2:26 PM 06/05/2016  6:34 PM Full Code [id number]  Nita Sells, MD Inpatient       Chief Complaint  Patient presents with  . Hospitalization Follow-up    HISTORY OF PRESENT ILLNESS:  This is an 82 year old female who Has been admitted to [place] on 06/11/16 from [facility] hospitalization 05/31/16 to 06/05/16. She has PMH of ovarian cancer on chemotherapy, afebrile on chronic Coumadin, GI bleed, pseudomonas bacteremia and currently on antibiotics. She was having BRBPR and melena. Hemoglobin dropped from 13-9 and INR >10. She was given IV vitamin K and FFP. GI was consulted and recommended no interventions as hemoglobin trended upward after INR correction. She was having fevers. Blood cultures grew positive for Pseudomonas aeruginosa. She was started on Zosyn and Cipro, they escalated to [facility] then changed to oral ciprofloxacin. She is S/P G-CSF per oncology. She had A. fib with RVR requiring diltiazem drip then changed to home medications. Her metoprolol was increased due to increasing HR with activity. Due to high risk of CVA with A. fib, it is thought that she should continue Coumadin instead of changing it to L Oquist says it is not reversible.  She has been admitted for a short-term rehabilitation.   PAST MEDICAL HISTORY:  Past Medical History:  Diagnosis Date  . Acute blood loss anemia   .  Chronic a-fib ([place])    a. DCCV 02/05/2007. b. sotalol stopped 2012. c. managed with rate control.  . Chronic diastolic CHF (congestive heart failure) (HCC)    associated with AFIB RVR  . Essential hypertension   . Glaucoma   . Hypokalemia   . Long term current use of anticoagulant therapy   . Osteoarthritis    hands and knees, back & knees & "all over"   . Pure hypercholesterolemia   . Slow transit constipation   . Type 2 diabetes mellitus without complication, with long-term current use of insulin (HCC)      CURRENT MEDICATIONS: Reviewed  Patient's Medications  New Prescriptions   No medications on file  Previous Medications   ATORVASTATIN (LIPITOR) 80 MG TABLET    TAKE 1 TABLET(80 MG) BY MOUTH DAILY   COENZYME Q10 (CO Q 10) 100 MG CAPS    Take 1 capsule by mouth daily.   DILTIAZEM (CARDIZEM CD) 240 MG 24 HR CAPSULE    Take 1 capsule (240 mg total) by mouth daily.   DORZOLAMIDE (TRUSOPT) 2 % OPHTHALMIC SOLUTION    Place 1 drop into both eyes 2 (two) times daily.    FUROSEMIDE (LASIX) 40 MG TABLET    Take 1 tablet daily. May take additional dose once weekly for swelling.   GLIPIZIDE (GLUCOTROL XL) 2.5 MG 24 HR TABLET    Take 2.5 mg by mouth 2 (two) times daily.    LATANOPROST (XALATAN) 0.005 %  OPHTHALMIC SOLUTION    Place 1 drop into both eyes at bedtime.   METFORMIN (GLUCOPHAGE) 500 MG TABLET    Take 500 mg by mouth 2 (two) times daily with a meal.    METOPROLOL TARTRATE (LOPRESSOR) 50 MG TABLET    Take 1 tablet (50 mg total) by mouth 2 (two) times daily.   OMEGA-3 FATTY ACIDS (FISH OIL) 1000 MG CAPS    Take 1,000 mg by mouth 2 (two) times daily.   ONDANSETRON (ZOFRAN) 8 MG TABLET    Take 1 tablet (8 mg total) by mouth every 8 (eight) hours as needed for nausea or vomiting.   PILOCARPINE (PILOCAR) 4 % OPHTHALMIC SOLUTION    Place 1 drop into both eyes 4 (four) times daily.    POLYETHYLENE GLYCOL (MIRALAX / GLYCOLAX) PACKET    Take 17 g by mouth daily as needed.    POTASSIUM CHLORIDE  (MICRO-K) 10 MEQ CR CAPSULE    Take 10 mEq by mouth daily.    RIVAROXABAN (XARELTO) 20 MG TABS TABLET    Take 1 tablet (20 mg total) by mouth daily with supper.   TIMOLOL (TIMOPTIC) 0.5 % OPHTHALMIC SOLUTION    Place 1 drop into both eyes 2 (two) times daily.   Modified Medications   No medications on file  Discontinued Medications   CIPROFLOXACIN (CIPRO) 750 MG TABLET    Take 1 tablet (750 mg total) by mouth 2 (two) times daily.   LUMIGAN 0.01 % SOLN    Place 1 drop into both eyes at bedtime.      Allergies  Allergen Reactions  . Orange Fruit [Citrus]      REVIEW OF SYSTEMS:  GENERAL: no change in appetite, no fatigue, no weight changes, no fever, chills or weakness SKIN: Denies rash, itching, wounds, ulcer sores, or nail abnormality EYES: Denies change in vision, dry eyes, eye pain, itching or discharge EARS: Denies change in hearing, ringing in ears, or earache NOSE: Denies nasal congestion or epistaxis MOUTH and THROAT: Denies oral discomfort, gingival pain or bleeding, pain from teeth or hoarseness   RESPIRATORY: no cough, SOB, DOE, wheezing, hemoptysis CARDIAC: no chest pain, edema or palpitations GI: no abdominal pain, diarrhea, constipation, heart burn, nausea or vomiting GU: Denies dysuria, frequency, hematuria, incontinence, or discharge MUSCULOSKELETAL: Denies joit pain, muscle pain, back pain, restricted movement, or unusual weakness CIRCULATION: Denies claudication, edema of legs, varicosities, or cold extremities NEUROLOGICAL: Denies dizziness, syncope, numbness, or headache PSYCHIATRIC: Denies feeling of depression or anxiety. No report of hallucinations, insomnia, paranoia, or agitation ENDOCRINE: Denies polyphagia, polyuria, polydipsia, heat or cold intolerance HEME/LYMPH: Denies excessive bruising, petechia, enlarged lymph nodes, or bleeding problems IMMUNOLOGIC: Denies history of frequent infections, AIDS, or use of immunosuppressive agents    PHYSICAL  EXAMINATION  GENERAL APPEARANCE: Well nourished. In no acute distress. Obese SKIN:  Skin is warm and dry.  HEAD: Normal in size and contour. No evidence of trauma EYES: Lids open and close normally. No blepharitis, entropion or ectropion. PERRL. Conjunctivae are clear and sclerae are white. Lenses are without opacity EARS: Pinnae are normal. Patient hears normal voice tunes of the examiner MOUTH and THROAT: Lips are without lesions. Oral mucosa is moist and without lesions. Tongue is normal in shape, size, and color and without lesions NECK: supple, trachea midline, no neck masses, no thyroid tenderness, no thyromegaly LYMPHATICS: no LAN in the neck, no supraclavicular LAN RESPIRATORY: breathing is even & unlabored, BS CTAB CARDIAC: RRR, no murmur,no extra heart sounds, 3+  edema on all 4 extremities GI: abdomen soft, normal BS, no masses, no tenderness, no hepatomegaly, no splenomegaly EXTREMITIES:  Able to move 4 extremities PSYCHIATRIC: Alert and oriented X 3. Affect and behavior are appropriate  LABS/RADIOLOGY: Labs reviewed: Basic Metabolic Panel:  Recent Labs  05/28/16 0919  06/08/16 0042 06/08/16 0939 06/09/16 0544 06/10/16 0314  NA 137  < >  --  141 142 142  K 3.8  < >  --  3.0* 3.3* 3.5  CL  --   < >  --  109 111 109  CO2 27  < >  --  24 23 25   GLUCOSE 104  < >  --  88 85 98  BUN 24.6  < >  --  19 16 13   CREATININE 0.7  < >  --  0.79 0.67 0.63  CALCIUM 8.2*  < >  --  8.0* 7.8* 7.8*  MG 1.3*  --  1.7  --   --   --   < > = values in this interval not displayed. Liver Function Tests:  Recent Labs  05/28/16 0919 05/31/16 0846 06/07/16 1737  AST 42* 36 29  ALT 33 27 27  ALKPHOS 152* 109 133*  BILITOT 0.77 1.2 0.7  PROT 5.7* 5.8* 5.1*  ALBUMIN 2.4* 2.7* 2.1*    Recent Labs  05/31/16 0846  LIPASE 11   CBC:  Recent Labs  06/04/16 0319 06/05/16 0327 06/07/16 1737 06/07/16 1756 06/09/16 0544  WBC 15.5* 20.1* 24.5*  --  19.5*  NEUTROABS 13.5* 17.5*  21.6*  --   --   HGB 10.3* 10.0* 10.8* 11.6* 9.9*  HCT 32.1* 31.2* 34.4* 34.0* 32.0*  MCV 77.2* 77.4* 78.7  --  82.1  PLT 91* 72* 122*  --  130*   Lipid Panel:  Recent Labs  06/08/16 0107  HDL 44   CBG:  Recent Labs  06/11/16 0646 06/11/16 1109 06/11/16 1622  GLUCAP 113* 90 83      Ct Abdomen Pelvis Wo Contrast  Result Date: 06/03/2016 CLINICAL DATA:  Drop in hemoglobin, evaluate for retroperitoneal bleed EXAM: CT ABDOMEN AND PELVIS WITHOUT CONTRAST TECHNIQUE: Multidetector CT imaging of the abdomen and pelvis was performed following the standard protocol without IV contrast. COMPARISON:  05/07/2016 FINDINGS: Lower chest: Moderate left pleural effusion. Small right pleural effusion. Hepatobiliary: Diminutive liver size with moderate perihepatic ascites. No focal gallbladder abnormality. Pancreas: Unremarkable. No pancreatic ductal dilatation or surrounding inflammatory changes. Spleen: Normal in size without focal abnormality. Adrenals/Urinary Tract: Adrenal glands are unremarkable. Kidneys are normal, without renal calculi, focal lesion, or hydronephrosis. Bladder is unremarkable. Stomach/Bowel: Stomach is within normal limits. Appendix appears normal. No evidence of bowel wall thickening, distention, or inflammatory changes. Vascular/Lymphatic: Normal caliber abdominal aorta. Abdominal aortic atherosclerosis. No lymphadenopathy. Reproductive: Soft tissue density seen in the cul-de-sac of indeterminate etiology. Other: Small amount of abdominal and pelvic ascites. Heterogeneous masslike area in the left anterior omentum measuring approximately 4 x 2.5 cm better seen on prior abdominal CT dated 05/07/2016 concerning for malignancy. Musculoskeletal: Right total hip arthroplasty without failure complication. Generalized osteopenia. Degenerative disc disease with disc height loss at L4-5 and L5-S1. Grade 1 anterolisthesis of L5 on S1 with bilateral facet arthropathy. IMPRESSION: 1.  Heterogeneous masslike area in the left anterior omentum measuring approximately 4 x 2.5 cm better seen on prior abdominal CT dated 05/07/2016 concerning for malignancy. Small amount of abdominal and pelvic ascites. 2. Moderate left and small right pleural effusion. 3.  Aortic Atherosclerosis (  ICD10-170.0) Electronically Signed   By: Kathreen Devoid   On: 06/03/2016 11:16   Dg Chest 2 View  Result Date: 05/31/2016 CLINICAL DATA:  Pt states she is "wore out". Recent chemo treatment for ovarian cancer. Denies any chest complaints. H/o CHF, Chronic a-fib, and type 2 diabetes. Former smoker. EXAM: CHEST  2 VIEW COMPARISON:  08/27/2015 FINDINGS: Small left pleural effusion. No focal consolidation. No pneumothorax. Stable cardiomediastinal silhouette. No acute osseous abnormality. IMPRESSION: Small left pleural effusion. Electronically Signed   By: Kathreen Devoid   On: 05/31/2016 13:19   Ct Head Wo Contrast  Result Date: 06/07/2016 CLINICAL DATA:  Altered mental status EXAM: CT HEAD WITHOUT CONTRAST TECHNIQUE: Contiguous axial images were obtained from the base of the skull through the vertex without intravenous contrast. COMPARISON:  None. FINDINGS: Brain: No evidence of acute infarction, hemorrhage, extra-axial collection, ventriculomegaly, or mass effect. Periventricular white matter low attenuation likely secondary to microangiopathy. Vascular: Cerebrovascular atherosclerotic calcifications are noted. Skull: Negative for fracture or focal lesion. Sinuses/Orbits: Visualized portions of the orbits are unremarkable. Visualized portions of the paranasal sinuses and mastoid air cells are unremarkable. Other: None. IMPRESSION: No acute intracranial pathology. Electronically Signed   By: Kathreen Devoid   On: 06/07/2016 20:39   Mr Brain W Wo Contrast  Result Date: 06/08/2016 CLINICAL DATA:  Left facial droop, left hemi paresis and dysarthria EXAM: MRI HEAD WITHOUT CONTRAST MRA HEAD WITHOUT CONTRAST TECHNIQUE:  Multiplanar, multiecho pulse sequences of the brain and surrounding structures were obtained without intravenous contrast. Angiographic images of the head were obtained using MRA technique without contrast. COMPARISON:  CT head 06/07/2016. FINDINGS: MRI HEAD FINDINGS Brain: There are high values on diffusion-weighted imaging within the tail of the right caudate nucleus and along the lateral right frontal cortex of, insula and anterior right temporal lobe. ADC values are mildly decreased but less so than would be expected in the acute phase. The midline structures are normal. There is associated hyperintense T2 weighted signal in the distribution corresponding to the above-described diffusion abnormalities. No mass lesion or midline shift. No hydrocephalus or extra-axial fluid collection. No age advanced or lobar predominant atrophy. Vascular: Major intracranial arterial and venous sinus flow voids are preserved. No evidence of chronic microhemorrhage or amyloid angiopathy. Skull and upper cervical spine: The visualized skull base, calvarium, upper cervical spine and extracranial soft tissues are normal. Sinuses/Orbits: No fluid levels or advanced mucosal thickening. No mastoid effusion. Normal orbits. MRA HEAD FINDINGS Intracranial internal carotid arteries: Normal. Anterior cerebral arteries: Absence of the right A1 segment is a normal variant. Otherwise normal. Middle cerebral arteries: Normal. Posterior communicating arteries: Present bilaterally. Posterior cerebral arteries: Normal. Basilar artery: Normal. Vertebral arteries: Codominant. Normal. Superior cerebellar arteries: Normal. Anterior inferior cerebellar arteries: Normal. Posterior inferior cerebellar arteries: Normal. IMPRESSION: 1. Areas of mild diffusion restriction within the right MCA territory, including the right frontal cortex, right insula and anterior right temporal cortex as well as the tail of the right caudate nucleus. ADC values are higher  than typically seen in the acute setting, but this is still favored to indicate late acute to early subacute ischemic infarct. 2. No hemorrhage or mass effect. These results will be called to the ordering clinician or representative by the Radiologist Assistant, and communication documented in the PACS or zVision Dashboard. Electronically Signed   By: Ulyses Jarred M.D.   On: 06/08/2016 03:45   US Abdomen Complete  Result Date: 06/01/2016 CLINICAL DATA:  Patient with diffuse abdominal pain for 3  days. EXAM: ABDOMEN ULTRASOUND COMPLETE COMPARISON:  CT abdomen pelvis 05/07/2016. FINDINGS: Gallbladder: No gallstones or wall thickening visualized. No sonographic Murphy sign noted by sonographer. Common bile duct: Diameter: 5 mm Liver: No focal lesion identified. Within normal limits in parenchymal echogenicity. IVC: No abnormality visualized. Pancreas: Visualized portion unremarkable. Spleen: Size and appearance within normal limits. Right Kidney: Length: 10.6 cm. Echogenicity within normal limits. No mass or hydronephrosis visualized. Left Kidney: Length: 8.4 cm. Echogenicity within normal limits. No mass or hydronephrosis visualized. Abdominal aorta: No aneurysm visualized. Other findings: Small amount of ascites. IMPRESSION: Small amount of ascites. No acute process within the abdomen. Electronically Signed   By: Lovey Newcomer M.D.   On: 06/01/2016 13:36   US Abdomen Limited  Result Date: 06/11/2016 CLINICAL DATA:  Ovarian cancer with history of ascites. Abdominal distention. Request evaluation for possible paracentesis. EXAM: LIMITED ABDOMINAL ULTRASOUND COMPARISON:  None. FINDINGS: Limited ultrasound of the abdomen finds trace to small volume of ascites. This is insufficient for safe paracentesis. Procedure not performed. IMPRESSION: Trace to small volume of ascites. Read by: Ascencion Dike PA-C Electronically Signed   By: Jacqulynn Cadet M.D.   On: 06/11/2016 13:55   US Paracentesis  Result Date:  05/28/2016 INDICATION: Patient with history of GYN malignancy, probable ovarian origin, recurrent ascites. Request made for therapeutic paracentesis. EXAM: ULTRASOUND GUIDED THERAPEUTIC PARACENTESIS MEDICATIONS: None. COMPLICATIONS: None immediate. PROCEDURE: Informed written consent was obtained from the patient after a discussion of the risks, benefits and alternatives to treatment. A timeout was performed prior to the initiation of the procedure. Initial ultrasound scanning demonstrates a small amount of ascites within the right lower abdominal quadrant. The right lower abdomen was prepped and draped in the usual sterile fashion. 1% lidocaine was used for local anesthesia. Following this, a Yueh catheter was introduced. An ultrasound image was saved for documentation purposes. The paracentesis was performed. The catheter was removed and a dressing was applied. The patient tolerated the procedure well without immediate post procedural complication. FINDINGS: A total of approximately 1.5 liters of slightly hazy, amber fluid was removed. IMPRESSION: Successful ultrasound-guided therapeutic paracentesis yielding 1.5 liters of peritoneal fluid. Read by: Rowe Robert, PA-C Electronically Signed   By: Jerilynn Mages.  Shick M.D.   On: 05/28/2016 12:36   Dg Chest Port 1 View  Result Date: 06/07/2016 CLINICAL DATA:  Left-sided weakness.  Pitting edema. EXAM: PORTABLE CHEST 1 VIEW COMPARISON:  May 31, 2016 FINDINGS: There is a small left effusion with underlying opacity, unchanged. No other interval changes or acute abnormalities. IMPRESSION: Stable left pleural effusion with underlying opacity. No other change. Electronically Signed   By: Dorise Bullion III M.D   On: 06/07/2016 17:55   Mr Jodene Nam Head/brain Wo Cm  Result Date: 06/08/2016 CLINICAL DATA:  Left facial droop, left hemi paresis and dysarthria EXAM: MRI HEAD WITHOUT CONTRAST MRA HEAD WITHOUT CONTRAST TECHNIQUE: Multiplanar, multiecho pulse sequences of the brain  and surrounding structures were obtained without intravenous contrast. Angiographic images of the head were obtained using MRA technique without contrast. COMPARISON:  CT head 06/07/2016. FINDINGS: MRI HEAD FINDINGS Brain: There are high values on diffusion-weighted imaging within the tail of the right caudate nucleus and along the lateral right frontal cortex of, insula and anterior right temporal lobe. ADC values are mildly decreased but less so than would be expected in the acute phase. The midline structures are normal. There is associated hyperintense T2 weighted signal in the distribution corresponding to the above-described diffusion abnormalities. No mass lesion or  midline shift. No hydrocephalus or extra-axial fluid collection. No age advanced or lobar predominant atrophy. Vascular: Major intracranial arterial and venous sinus flow voids are preserved. No evidence of chronic microhemorrhage or amyloid angiopathy. Skull and upper cervical spine: The visualized skull base, calvarium, upper cervical spine and extracranial soft tissues are normal. Sinuses/Orbits: No fluid levels or advanced mucosal thickening. No mastoid effusion. Normal orbits. MRA HEAD FINDINGS Intracranial internal carotid arteries: Normal. Anterior cerebral arteries: Absence of the right A1 segment is a normal variant. Otherwise normal. Middle cerebral arteries: Normal. Posterior communicating arteries: Present bilaterally. Posterior cerebral arteries: Normal. Basilar artery: Normal. Vertebral arteries: Codominant. Normal. Superior cerebellar arteries: Normal. Anterior inferior cerebellar arteries: Normal. Posterior inferior cerebellar arteries: Normal. IMPRESSION: 1. Areas of mild diffusion restriction within the right MCA territory, including the right frontal cortex, right insula and anterior right temporal cortex as well as the tail of the right caudate nucleus. ADC values are higher than typically seen in the acute setting, but this is  still favored to indicate late acute to early subacute ischemic infarct. 2. No hemorrhage or mass effect. These results will be called to the ordering clinician or representative by the Radiologist Assistant, and communication documented in the PACS or zVision Dashboard. Electronically Signed   By: Ulyses Jarred M.D.   On: 06/08/2016 03:45    ASSESSMENT/PLAN:  Generalized weakness - for rehabilitation, PT and OT, for therapeutic strengthening exercises; fall precaution  Acute right MCA territory ischemic infarction - presented with slurred speech, facial droop and left-sided weakness, no facial droop nor slurring of speech has been noted today; due to fluctuations in INR, neurology recommended to start Xarelto 20 mg daily and Lipitor 80 mg 1 tab by mouth daily at bedtime; for rehabilitation, PT and OT, for therapeutic strengthening exercises  Acute lower GI bleed - S/P FFP and vitamin K; resolved; GI was consulted and plans to resume anticoagulation , Xarelto was started  Acute blood loss anemia - S/P FFP and vitamin K, check CBC Lab Results  Component Value Date   HGB 11.2 (L) 06/17/2016   Thrombocytopenia - platelet 130; likely due to chemotherapy and recommended transfusion for <50K; will monitor for bleeding and bruising  Atrial fibrillation with RVR - rate controlled; off diltiazem drip, increase metoprolol to 50 mg twice a day and continue Cardizem CD 240 mg capsule 1 capsule by mouth daily;Recently started on Xarelto 20 mg daily per recommendation of neurology due to fluctuations in INR   Ovarian cancer - on chemotherapy, follow-up with oncologist Dr. Ammie Dalton; she has complained of abdominal distention and had abdominal paracentesis on 06/09/16  Bacteremia due to Pseudomonas - has completed Ciprofloxacin on 11/26  Diabetes mellitus, type II - continue Glucophage 500 mg 1 tab by mouth twice a day, Trusopt 2% eyedrops 1 drop into both eyes twice a day, Pilocarpine 4% eyedrops 1 drop into  both eyes 4 times a day and Glucotrol XL 2.5 mg 1 tab by mouth twice a day Lab Results  Component Value Date   HGBA1C 6.1 (H) 06/08/2016   Glaucoma - Continue Timoptic 0.5% eyedrops 1 drop into both eyes twice a day and latanoprost 0.005% eyedrops instill 1 drop into eyes daily at bedtime  Hyperlipidemia - continue Lipitor 80 mg 1 tab by mouth daily at bedtime Lab Results  Component Value Date   CHOL 110 06/08/2016   HDL 44 06/08/2016   LDLCALC 46 06/08/2016   TRIG 102 06/08/2016   CHOLHDL 2.5 06/08/2016   Generalized Edema -  continue Lasix 40 mg 1 tab by mouth daily and Lasix 40 mg 1 tab every week for swelling  Hypokalemia - K 3.3 ;continue KCl ER 10 meq by mouth daily; check BMP  Leukocytosis - wbc 19.5 ; no fever, will monitor  Severe protein-calorie malnutrition -  RD consult  Constipation - continue MiraLAX 17 g by mouth daily      Goals of care:  Short-term rehabilitation    Durenda Age - NP Dalton [phone]

## 2016-06-17 ENCOUNTER — Telehealth: Payer: Self-pay | Admitting: Nurse Practitioner

## 2016-06-17 ENCOUNTER — Other Ambulatory Visit (HOSPITAL_BASED_OUTPATIENT_CLINIC_OR_DEPARTMENT_OTHER): Payer: Medicare Other

## 2016-06-17 ENCOUNTER — Telehealth: Payer: Self-pay | Admitting: *Deleted

## 2016-06-17 ENCOUNTER — Ambulatory Visit (HOSPITAL_BASED_OUTPATIENT_CLINIC_OR_DEPARTMENT_OTHER): Payer: Medicare Other | Admitting: Oncology

## 2016-06-17 VITALS — BP 96/58 | HR 89 | Temp 97.6°F | Resp 18 | Ht 66.0 in

## 2016-06-17 DIAGNOSIS — R197 Diarrhea, unspecified: Secondary | ICD-10-CM

## 2016-06-17 DIAGNOSIS — R601 Generalized edema: Secondary | ICD-10-CM | POA: Diagnosis not present

## 2016-06-17 DIAGNOSIS — C569 Malignant neoplasm of unspecified ovary: Secondary | ICD-10-CM

## 2016-06-17 DIAGNOSIS — R140 Abdominal distension (gaseous): Secondary | ICD-10-CM | POA: Diagnosis not present

## 2016-06-17 DIAGNOSIS — R180 Malignant ascites: Secondary | ICD-10-CM

## 2016-06-17 LAB — CBC WITH DIFFERENTIAL/PLATELET
BASO%: 0.7 % (ref 0.0–2.0)
Basophils Absolute: 0.1 10*3/uL (ref 0.0–0.1)
EOS ABS: 0.1 10*3/uL (ref 0.0–0.5)
EOS%: 0.7 % (ref 0.0–7.0)
HCT: 34 % — ABNORMAL LOW (ref 34.8–46.6)
HEMOGLOBIN: 11.2 g/dL — AB (ref 11.6–15.9)
LYMPH%: 8.4 % — ABNORMAL LOW (ref 14.0–49.7)
MCH: 25.5 pg (ref 25.1–34.0)
MCHC: 32.9 g/dL (ref 31.5–36.0)
MCV: 77.6 fL — AB (ref 79.5–101.0)
MONO#: 0.9 10*3/uL (ref 0.1–0.9)
MONO%: 9.7 % (ref 0.0–14.0)
NEUT%: 80.5 % — ABNORMAL HIGH (ref 38.4–76.8)
NEUTROS ABS: 7.4 10*3/uL — AB (ref 1.5–6.5)
Platelets: 262 10*3/uL (ref 145–400)
RBC: 4.39 10*6/uL (ref 3.70–5.45)
RDW: 18.7 % — AB (ref 11.2–14.5)
WBC: 9.2 10*3/uL (ref 3.9–10.3)
lymph#: 0.8 10*3/uL — ABNORMAL LOW (ref 0.9–3.3)

## 2016-06-17 LAB — COMPREHENSIVE METABOLIC PANEL
ALBUMIN: 2 g/dL — AB (ref 3.5–5.0)
ALK PHOS: 113 U/L (ref 40–150)
ALT: 15 U/L (ref 0–55)
AST: 23 U/L (ref 5–34)
Anion Gap: 12 mEq/L — ABNORMAL HIGH (ref 3–11)
BILIRUBIN TOTAL: 0.74 mg/dL (ref 0.20–1.20)
BUN: 13.5 mg/dL (ref 7.0–26.0)
CO2: 31 mEq/L — ABNORMAL HIGH (ref 22–29)
CREATININE: 0.7 mg/dL (ref 0.6–1.1)
Calcium: 7.6 mg/dL — ABNORMAL LOW (ref 8.4–10.4)
Chloride: 102 mEq/L (ref 98–109)
EGFR: 80 mL/min/{1.73_m2} — AB (ref >90)
GLUCOSE: 106 mg/dL (ref 70–140)
Potassium: 2.7 mEq/L — CL (ref 3.5–5.1)
SODIUM: 145 meq/L (ref 136–145)
TOTAL PROTEIN: 5.7 g/dL — AB (ref 6.4–8.3)

## 2016-06-17 LAB — MAGNESIUM: Magnesium: 1.2 mg/dl — CL (ref 1.5–2.5)

## 2016-06-17 LAB — BASIC METABOLIC PANEL
BUN: 13 mg/dL (ref 4–21)
Creatinine: 0.7 mg/dL (ref 0.5–1.1)
GLUCOSE: 55 mg/dL
POTASSIUM: 2.9 mmol/L — AB (ref 3.4–5.3)
SODIUM: 148 mmol/L — AB (ref 137–147)

## 2016-06-17 MED ORDER — POTASSIUM CHLORIDE ER 10 MEQ PO CPCR: 10.0000 meq | ORAL_CAPSULE | Freq: Three times a day (TID) | ORAL | 1 refills | Status: DC

## 2016-06-17 NOTE — Telephone Encounter (Signed)
Brush, spoke with Chinwe, Therapist, sports. Informed her of order for potassium 19meq today then 30 meq daily. Pt has written order in her packet to return to facility. Chinwe voiced understanding.

## 2016-06-17 NOTE — Telephone Encounter (Signed)
Appointments scheduled per 11/28 LOS. Patient given AVS report and calendars with future scheduled appointments.

## 2016-06-17 NOTE — Progress Notes (Signed)
[facility] OFFICE PROGRESS NOTE   Diagnosis: Ovarian cancer  INTERVAL HISTORY:   Yolanda James completed a first cycle of Taxol/carboplatin chemotherapy on 05/23/2016. She was seen in the symptom management clinic on 05/28/2016 with blood in the stool and diarrhea. She underwent a paracentesis 05/28/2016. On 05/31/2016 she presented with recurrent rectal bleeding and generalized weakness. She was admitted for further evaluation. She was found to have severe neutropenia and fever. Blood cultures returned positive for pseudomonas. She completed a course of antibiotics and was discharged to home 06/05/2016 with the plan to complete an outpatient course of ciprofloxacin. The white count recovered prior to discharge from the hospital. She was discharged to a skilled nursing facility and was readmitted on 06/07/2016 with a facial droop and slurred speech. 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 Coumadin was discontinued and she was switched to Xarelto.  Ms. Tieken reports the stroke symptoms completely resolved. She continues to live in a skilled nursing facility. She is receiving physical therapy. She is not ambulatory at present. She has generalized weakness. She complains of swelling in the arms and legs. The abdominal pain and distention have improved. No nausea. Her appetite is poor.  She presents today with her son and daughter. Objective:  Vital signs in last 24 hours:  Blood pressure (!) 96/58, pulse 89, temperature 97.6 F (36.4 C), temperature source Oral, resp. rate 18, height 5\' 6"  (1.676 m), SpO2 98 %.    HEENT: No thrush or ulcers Resp: Decreased breath sounds at the left posterior base, no respiratory distress Cardio: Regular rate and rhythm GI: No hepatomegaly, no mass, mildly distended, nontender Vascular: Pitting edema with weeping of fluid at the left greater than right arm/hand and bilateral  lower leg Neuro: Alert and oriented, she moves all extremities and follows commands. She has proximal weakness in the legs and arms. She was unable to stand.    Lab Results:  Lab Results  Component Value Date   WBC 9.2 06/17/2016   HGB 11.2 (L) 06/17/2016   HCT 34.0 (L) 06/17/2016   MCV 77.6 (L) 06/17/2016   PLT 262 06/17/2016   NEUTROABS 7.4 (H) 06/17/2016  Potassium 2.7, BUN 13.5, creatinine 0.7, albumin 2.0   Medications: I have reviewed the patient's current medications.  Assessment/Plan: 1. GYN malignancy presenting with Abdominal distention/anorexia ? CT of the abdomen/pelvis 05/07/2016 revealed ascites, omental caking, and a small left pleural effusion ? Elevated CA 125 ? Paracentesis 05/13/2016 with cytology positive for carcinoma ? Cycle 1 Taxol/carboplatin 05/23/2016  2. Atrial fibrillation -previously maintained on Coumadin, PT/INR supratherapeutic on admission 05/31/2016, anticoagulation switched to Xarelto after an admission with a CVA 06/07/2016  3. Diabetes  4. Hypertension  5. Glaucoma  6.    Pancytopenia following cycle 1 Taxol/carboplatin  7.    Admission 05/31/2016 with fever in the setting of severe neutropenia, blood culture from 05/31/2016 positive for pseudomonas aeruginosa  8.    history of Dyspnea secondary to the distended abdomen, pleural effusions-improved  9.    history of rectal bleeding  10.  Anemia-Red cell microcytosis predated chemotherapy, chronic GI bleeding?  11.  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  Anasarca  13.  Hypokalemia-likely secondary to furosemide    Disposition:  Ms. Wetsel has been diagnosed with a gynecologic malignancy after presenting with carcinomatosis and malignant ascites. She completed 1  cycle of chemotherapy with Taxol/carboplatin. This was complicated by the development of pancytopenia and admission with  pseudomonas bacteremia. She subsequently suffered a right MCA CVA in the setting of a fibrillation while on Coumadin anticoagulation. She is now maintained on Xarelto.  I had a long discussion with Ms. Loveland and her children. We discussed the advanced malignancy and her multiple comorbid conditions. She is currently deconditioned and malnourished following the recent hospital admissions . I encouraged her to increase her diet as tolerated and continue participated with the physical therapist. She indicated that she does wish to continue systemic chemotherapy for treatment of the cancer.  The abdominal distention has improved following cycle 1 Taxol/carboplatin. She developed pancytopenia following cycle 1. We decided to switch to weekly Taxol beginning with cycle 2. The carboplatin will be dose reduced.  Ms. Uranga will be scheduled for cycle 2 chemotherapy beginning 06/23/2016. She will return for an office visit that day.  We repleted the potassium. I recommended she elevate the arms and legs for help with the anasarca. A PICC will be placed for the administration of chemotherapy on 06/23/2016.    Betsy Coder, MD  06/17/2016  2:37 PM

## 2016-06-18 ENCOUNTER — Telehealth: Payer: Self-pay | Admitting: *Deleted

## 2016-06-18 ENCOUNTER — Encounter: Payer: Self-pay | Admitting: Internal Medicine

## 2016-06-18 ENCOUNTER — Non-Acute Institutional Stay (SKILLED_NURSING_FACILITY): Payer: Medicare Other | Admitting: Internal Medicine

## 2016-06-18 DIAGNOSIS — I63411 Cerebral infarction due to embolism of right middle cerebral artery: Secondary | ICD-10-CM | POA: Diagnosis not present

## 2016-06-18 DIAGNOSIS — E785 Hyperlipidemia, unspecified: Secondary | ICD-10-CM

## 2016-06-18 DIAGNOSIS — E669 Obesity, unspecified: Secondary | ICD-10-CM | POA: Diagnosis not present

## 2016-06-18 DIAGNOSIS — R531 Weakness: Secondary | ICD-10-CM | POA: Diagnosis not present

## 2016-06-18 DIAGNOSIS — E876 Hypokalemia: Secondary | ICD-10-CM | POA: Diagnosis not present

## 2016-06-18 DIAGNOSIS — E440 Moderate protein-calorie malnutrition: Secondary | ICD-10-CM

## 2016-06-18 DIAGNOSIS — D62 Acute posthemorrhagic anemia: Secondary | ICD-10-CM | POA: Diagnosis not present

## 2016-06-18 DIAGNOSIS — I482 Chronic atrial fibrillation, unspecified: Secondary | ICD-10-CM

## 2016-06-18 DIAGNOSIS — E1169 Type 2 diabetes mellitus with other specified complication: Secondary | ICD-10-CM | POA: Diagnosis not present

## 2016-06-18 DIAGNOSIS — C569 Malignant neoplasm of unspecified ovary: Secondary | ICD-10-CM | POA: Diagnosis not present

## 2016-06-18 DIAGNOSIS — Z8719 Personal history of other diseases of the digestive system: Secondary | ICD-10-CM

## 2016-06-18 DIAGNOSIS — R601 Generalized edema: Secondary | ICD-10-CM | POA: Diagnosis not present

## 2016-06-18 NOTE — Progress Notes (Signed)
LOCATION: [facility]  PCP: [name], MD   Code Status: Full Code  Goals of care: Advanced Directive information Advanced Directives 06/17/2016  Does Patient Have a Medical Advance Directive? No  Type of Advance Directive -  Copy of [place] in Chart? -  Would patient like information on creating a medical advance directive? -       Extended Emergency Contact Information Primary Emergency Contact: Evans,Darla Address: [address], [place] [address] [place] Phone: [phone] Relation: Daughter Secondary Emergency Contact: Valente David Address: [address] [address]          [place], [place] [address] [place] Mobile Phone: [phone] Relation: Son   Allergies  Allergen Reactions  . [place]     Chief Complaint  Patient presents with  . Readmit To SNF    Readmission Visit     HPI:  Patient is a 82 y.o. female seen today for short term rehabilitation post hospital re-admission from 06/07/16-06/11/16 with acute right MCA stroke. Neurology was consulted. Coumadin was discontinued and she was started on xarelto.  Of note, she was in this facility for short term rehabilitation post hospital admission from 05/31/16-06/05/16 with diverticular bleed and pseudomonas bacteremia. She has history of ovarian cancer and had recently received her first cycle of chemotherapy, afib on coumadin. She is seen in her room today. Her niece is present at bedside.    Review of Systems:  Constitutional: Negative for fever, chills, diaphoresis.  HENT: Negative for headache, congestion, nasal discharge Eyes: Negative for blurred vision, double vision and discharge.  Respiratory: Negative for cough, shortness of breath and wheezing.   Cardiovascular: Negative for chest pain, palpitations, leg swelling.  Gastrointestinal: Negative for heartburn, nausea, vomiting, abdominal pain. Last bowel movement was  today. Genitourinary: Negative for dysuria  Musculoskeletal: Negative for back pain, fall in the facility.  Skin: Negative for itching, rash.  Neurological: Negative for dizziness. Psychiatric/Behavioral: Negative for depression    Past Medical History:  Diagnosis Date  . Acute blood loss anemia   . Chronic a-fib ([facility])    a. DCCV 02/05/2007. b. sotalol stopped 2012. c. managed with rate control.  . Chronic diastolic CHF (congestive heart failure) (H[facility])    associated with AFIB RVR  . Essential hypertension   . Glaucoma   . Hypokalemia   . Long term current use of anticoagulant therapy   . Osteoarthritis    hands and knees, back & knees & "all over"   . Pure hypercholesterolemia   . Slow transit constipation   . Type 2 diabetes mellitus without complication, with long-term current use of insulin [facility])    Past Surgical History:  Procedure Laterality Date  . EYE SURGERY     laser & cataracts removed & IOL both eyes, glaucoma surgery   . GANGLION CYST EXCISION Left 1965   wrist  . TOTAL HIP ARTHROPLASTY Right 09/03/2015   Procedure: TOTAL HIP ARTHROPLASTY;  Surgeon: Frederik Pear, MD;  Location: [facility];  Service: Orthopedics;  Laterality: Right;  . vaginal births     x3   Social History:   reports that she has quit smoking. She has never used smokeless tobacco. She reports that she does not drink alcohol or use drugs.  Family History  Problem Relation Age of Onset  . Arrhythmia Sister   . Heart attack Sister     Medications:   Medication  List       Accurate as of 06/18/16 11:59 PM. Always use your most recent med list.          atorvastatin 80 MG tablet Commonly known as:  LIPITOR TAKE 1 TABLET(80 MG) BY MOUTH DAILY   Co Q 10 100 MG Caps Take 1 capsule by mouth daily.   diltiazem 240 MG 24 hr capsule Commonly known as:  CARDIZEM CD Take 1 capsule (240 mg total) by mouth daily.   dorzolamide 2 % ophthalmic solution Commonly known as:  TRUSOPT Place 1 drop  into both eyes 2 (two) times daily.   Fish Oil 1000 MG Caps Take 1,000 mg by mouth 2 (two) times daily.   furosemide 40 MG tablet Commonly known as:  LASIX Take 1 tablet daily. May take additional dose once weekly for swelling.   glipiZIDE 2.5 MG 24 hr tablet Commonly known as:  GLUCOTROL XL Take 2.5 mg by mouth 2 (two) times daily.   latanoprost 0.005 % ophthalmic solution Commonly known as:  XALATAN Place 1 drop into both eyes at bedtime.   metFORMIN 500 MG tablet Commonly known as:  GLUCOPHAGE Take 500 mg by mouth 2 (two) times daily with a meal.   metoprolol 50 MG tablet Commonly known as:  LOPRESSOR Take 1 tablet (50 mg total) by mouth 2 (two) times daily.   ondansetron 8 MG tablet Commonly known as:  ZOFRAN Take 1 tablet (8 mg total) by mouth every 8 (eight) hours as needed for nausea or vomiting.   pilocarpine 4 % ophthalmic solution Commonly known as:  PILOCAR Place 1 drop into both eyes 4 (four) times daily.   polyethylene glycol packet Commonly known as:  MIRALAX / GLYCOLAX Take 17 g by mouth daily as needed.   potassium chloride 10 MEQ tablet Commonly known as:  K-DUR,KLOR-CON Take 30 mEq by mouth daily.   rivaroxaban 20 MG Tabs tablet Commonly known as:  XARELTO Take 1 tablet (20 mg total) by mouth daily with supper.   timolol 0.5 % ophthalmic solution Commonly known as:  TIMOPTIC Place 1 drop into both eyes 2 (two) times daily.   UNABLE TO FIND Med Name: Med pass 120 mL 3 times daily       Immunizations: Immunization History  Administered Date(s) Administered  . PPD Test 06/05/2016     Physical Exam:  Vitals:   06/18/16 1501  BP: 115/62  Pulse: 95  Resp: 20  Temp: 97.7 F (36.5 C)  TempSrc: Oral  Height: 5\' 6"  (1.676 m)   There is no height or weight on file to calculate BMI.  Wt Readings from Last 3 Encounters:  06/16/16 241 lb 9.6 oz (109.6 kg)  06/07/16 241 lb 9.6 oz (109.6 kg)  06/06/16 236 lb 6.4 oz (107.2 kg)     General- elderly female, obese, in no acute distress Head- normocephalic, atraumatic Nose- no maxillary or frontal sinus tenderness, no nasal discharge Throat- moist mucus membrane, has upper and lower dentures Eyes- PERRLA, EOMI, no pallor, no icterus Neck- no cervical lymphadenopathy Cardiovascular- irregular heart rate, no murmur Respiratory- bilateral clear to auscultation, no wheeze, no rhonchi, no crackles, no use of accessory muscles Abdomen- bowel sounds present, soft, non tender, distended Musculoskeletal- able to move all 4 extremities, generalized weakness, 1+ arm edema and 2+ leg edema Neurological- alert and oriented to person, place and time Skin- warm and dry, easy bruising Psychiatry- normal mood and affect     Labs reviewed: Basic Metabolic Panel:  Recent Labs  05/28/16 0919  06/08/16 0042 06/08/16 0939 06/09/16 0544 06/10/16 0314 06/17/16 06/17/16 1219  NA 137  < >  --  141 142 142 148* 145  K 3.8  < >  --  3.0* 3.3* 3.5 2.9* 2.7*  CL  --   < >  --  109 111 109  --   --   CO2 27  < >  --  24 23 25   --  31*  GLUCOSE 104  < >  --  88 85 98  --  106  BUN 24.6  < >  --  19 16 13 13  13.5  CREATININE 0.7  < >  --  0.79 0.67 0.63 0.7 0.7  CALCIUM 8.2*  < >  --  8.0* 7.8* 7.8*  --  7.6*  MG 1.3*  --  1.7  --   --   --   --  1.2*  < > = values in this interval not displayed. Liver Function Tests:  Recent Labs  05/31/16 0846 06/07/16 1737 06/17/16 1219  AST 36 29 23  ALT 27 27 15   ALKPHOS 109 133* 113  BILITOT 1.2 0.7 0.74  PROT 5.8* 5.1* 5.7*  ALBUMIN 2.7* 2.1* 2.0*    Recent Labs  05/31/16 0846  LIPASE 11   No results for input(s): AMMONIA in the last 8760 hours. CBC:  Recent Labs  06/05/16 0327 06/07/16 1737 06/07/16 1756 06/09/16 0544 06/17/16 1219  WBC 20.1* 24.5*  --  19.5* 9.2  NEUTROABS 17.5* 21.6*  --   --  7.4*  HGB 10.0* 10.8* 11.6* 9.9* 11.2*  HCT 31.2* 34.4* 34.0* 32.0* 34.0*  MCV 77.4* 78.7  --  82.1 77.6*  PLT 72*  122*  --  130* 262   Cardiac Enzymes: No results for input(s): CKTOTAL, CKMB, CKMBINDEX, TROPONINI in the last 8760 hours. BNP: Invalid input(s): POCBNP CBG:  Recent Labs  06/11/16 0646 06/11/16 1109 06/11/16 1622  GLUCAP 113* 90 83    Radiological Exams: Ct Abdomen Pelvis Wo Contrast  Result Date: 06/03/2016 CLINICAL DATA:  Drop in hemoglobin, evaluate for retroperitoneal bleed EXAM: CT ABDOMEN AND PELVIS WITHOUT CONTRAST TECHNIQUE: Multidetector CT imaging of the abdomen and pelvis was performed following the standard protocol without IV contrast. COMPARISON:  05/07/2016 FINDINGS: Lower chest: Moderate left pleural effusion. Small right pleural effusion. Hepatobiliary: Diminutive liver size with moderate perihepatic ascites. No focal gallbladder abnormality. Pancreas: Unremarkable. No pancreatic ductal dilatation or surrounding inflammatory changes. Spleen: Normal in size without focal abnormality. Adrenals/Urinary Tract: Adrenal glands are unremarkable. Kidneys are normal, without renal calculi, focal lesion, or hydronephrosis. Bladder is unremarkable. Stomach/Bowel: Stomach is within normal limits. Appendix appears normal. No evidence of bowel wall thickening, distention, or inflammatory changes. Vascular/Lymphatic: Normal caliber abdominal aorta. Abdominal aortic atherosclerosis. No lymphadenopathy. Reproductive: Soft tissue density seen in the cul-de-sac of indeterminate etiology. Other: Small amount of abdominal and pelvic ascites. Heterogeneous masslike area in the left anterior omentum measuring approximately 4 x 2.5 cm better seen on prior abdominal CT dated 05/07/2016 concerning for malignancy. Musculoskeletal: Right total hip arthroplasty without failure complication. Generalized osteopenia. Degenerative disc disease with disc height loss at L4-5 and L5-S1. Grade 1 anterolisthesis of L5 on S1 with bilateral facet arthropathy. IMPRESSION: 1. Heterogeneous masslike area in the left  anterior omentum measuring approximately 4 x 2.5 cm better seen on prior abdominal CT dated 05/07/2016 concerning for malignancy. Small amount of abdominal and pelvic ascites. 2. Moderate left and small right pleural  effusion. 3.  Aortic Atherosclerosis (ICD10-170.0) Electronically Signed   By: Kathreen Devoid   On: 06/03/2016 11:16   Dg Chest 2 View  Result Date: 05/31/2016 CLINICAL DATA:  Pt states she is "wore out". Recent chemo treatment for ovarian cancer. Denies any chest complaints. H/o CHF, Chronic a-fib, and type 2 diabetes. Former smoker. EXAM: CHEST  2 VIEW COMPARISON:  08/27/2015 FINDINGS: Small left pleural effusion. No focal consolidation. No pneumothorax. Stable cardiomediastinal silhouette. No acute osseous abnormality. IMPRESSION: Small left pleural effusion. Electronically Signed   By: Kathreen Devoid   On: 05/31/2016 13:19   Ct Head Wo Contrast  Result Date: 06/07/2016 CLINICAL DATA:  Altered mental status EXAM: CT HEAD WITHOUT CONTRAST TECHNIQUE: Contiguous axial images were obtained from the base of the skull through the vertex without intravenous contrast. COMPARISON:  None. FINDINGS: Brain: No evidence of acute infarction, hemorrhage, extra-axial collection, ventriculomegaly, or mass effect. Periventricular white matter low attenuation likely secondary to microangiopathy. Vascular: Cerebrovascular atherosclerotic calcifications are noted. Skull: Negative for fracture or focal lesion. Sinuses/Orbits: Visualized portions of the orbits are unremarkable. Visualized portions of the paranasal sinuses and mastoid air cells are unremarkable. Other: None. IMPRESSION: No acute intracranial pathology. Electronically Signed   By: Kathreen Devoid   On: 06/07/2016 20:39   Mr Brain W Wo Contrast  Result Date: 06/08/2016 CLINICAL DATA:  Left facial droop, left hemi paresis and dysarthria EXAM: MRI HEAD WITHOUT CONTRAST MRA HEAD WITHOUT CONTRAST TECHNIQUE: Multiplanar, multiecho pulse sequences of the  brain and surrounding structures were obtained without intravenous contrast. Angiographic images of the head were obtained using MRA technique without contrast. COMPARISON:  CT head 06/07/2016. FINDINGS: MRI HEAD FINDINGS Brain: There are high values on diffusion-weighted imaging within the tail of the right caudate nucleus and along the lateral right frontal cortex of, insula and anterior right temporal lobe. ADC values are mildly decreased but less so than would be expected in the acute phase. The midline structures are normal. There is associated hyperintense T2 weighted signal in the distribution corresponding to the above-described diffusion abnormalities. No mass lesion or midline shift. No hydrocephalus or extra-axial fluid collection. No age advanced or lobar predominant atrophy. Vascular: Major intracranial arterial and venous sinus flow voids are preserved. No evidence of chronic microhemorrhage or amyloid angiopathy. Skull and upper cervical spine: The visualized skull base, calvarium, upper cervical spine and extracranial soft tissues are normal. Sinuses/Orbits: No fluid levels or advanced mucosal thickening. No mastoid effusion. Normal orbits. MRA HEAD FINDINGS Intracranial internal carotid arteries: Normal. Anterior cerebral arteries: Absence of the right A1 segment is a normal variant. Otherwise normal. Middle cerebral arteries: Normal. Posterior communicating arteries: Present bilaterally. Posterior cerebral arteries: Normal. Basilar artery: Normal. Vertebral arteries: Codominant. Normal. Superior cerebellar arteries: Normal. Anterior inferior cerebellar arteries: Normal. Posterior inferior cerebellar arteries: Normal. IMPRESSION: 1. Areas of mild diffusion restriction within the right MCA territory, including the right frontal cortex, right insula and anterior right temporal cortex as well as the tail of the right caudate nucleus. ADC values are higher than typically seen in the acute setting, but  this is still favored to indicate late acute to early subacute ischemic infarct. 2. No hemorrhage or mass effect. These results will be called to the ordering clinician or representative by the Radiologist Assistant, and communication documented in the PACS or zVision Dashboard. Electronically Signed   By: Ulyses Jarred M.D.   On: 06/08/2016 03:45   US Abdomen Complete  Result Date: 06/01/2016 CLINICAL DATA:  Patient with  diffuse abdominal pain for 3 days. EXAM: ABDOMEN ULTRASOUND COMPLETE COMPARISON:  CT abdomen pelvis 05/07/2016. FINDINGS: Gallbladder: No gallstones or wall thickening visualized. No sonographic Murphy sign noted by sonographer. Common bile duct: Diameter: 5 mm Liver: No focal lesion identified. Within normal limits in parenchymal echogenicity. IVC: No abnormality visualized. Pancreas: Visualized portion unremarkable. Spleen: Size and appearance within normal limits. Right Kidney: Length: 10.6 cm. Echogenicity within normal limits. No mass or hydronephrosis visualized. Left Kidney: Length: 8.4 cm. Echogenicity within normal limits. No mass or hydronephrosis visualized. Abdominal aorta: No aneurysm visualized. Other findings: Small amount of ascites. IMPRESSION: Small amount of ascites. No acute process within the abdomen. Electronically Signed   By: Lovey Newcomer M.D.   On: 06/01/2016 13:36   US Abdomen Limited  Result Date: 06/11/2016 CLINICAL DATA:  Ovarian cancer with history of ascites. Abdominal distention. Request evaluation for possible paracentesis. EXAM: LIMITED ABDOMINAL ULTRASOUND COMPARISON:  None. FINDINGS: Limited ultrasound of the abdomen finds trace to small volume of ascites. This is insufficient for safe paracentesis. Procedure not performed. IMPRESSION: Trace to small volume of ascites. Read by: Ascencion Dike PA-C Electronically Signed   By: Jacqulynn Cadet M.D.   On: 06/11/2016 13:55   US Paracentesis  Result Date: 05/28/2016 INDICATION: Patient with history of GYN  malignancy, probable ovarian origin, recurrent ascites. Request made for therapeutic paracentesis. EXAM: ULTRASOUND GUIDED THERAPEUTIC PARACENTESIS MEDICATIONS: None. COMPLICATIONS: None immediate. PROCEDURE: Informed written consent was obtained from the patient after a discussion of the risks, benefits and alternatives to treatment. A timeout was performed prior to the initiation of the procedure. Initial ultrasound scanning demonstrates a small amount of ascites within the right lower abdominal quadrant. The right lower abdomen was prepped and draped in the usual sterile fashion. 1% lidocaine was used for local anesthesia. Following this, a Yueh catheter was introduced. An ultrasound image was saved for documentation purposes. The paracentesis was performed. The catheter was removed and a dressing was applied. The patient tolerated the procedure well without immediate post procedural complication. FINDINGS: A total of approximately 1.5 liters of slightly hazy, amber fluid was removed. IMPRESSION: Successful ultrasound-guided therapeutic paracentesis yielding 1.5 liters of peritoneal fluid. Read by: Rowe Robert, PA-C Electronically Signed   By: Jerilynn Mages.  Shick M.D.   On: 05/28/2016 12:36   Dg Chest Port 1 View  Result Date: 06/07/2016 CLINICAL DATA:  Left-sided weakness.  Pitting edema. EXAM: PORTABLE CHEST 1 VIEW COMPARISON:  May 31, 2016 FINDINGS: There is a small left effusion with underlying opacity, unchanged. No other interval changes or acute abnormalities. IMPRESSION: Stable left pleural effusion with underlying opacity. No other change. Electronically Signed   By: Dorise Bullion III M.D   On: 06/07/2016 17:55   Mr Jodene Nam Head/brain Wo Cm  Result Date: 06/08/2016 CLINICAL DATA:  Left facial droop, left hemi paresis and dysarthria EXAM: MRI HEAD WITHOUT CONTRAST MRA HEAD WITHOUT CONTRAST TECHNIQUE: Multiplanar, multiecho pulse sequences of the brain and surrounding structures were obtained without  intravenous contrast. Angiographic images of the head were obtained using MRA technique without contrast. COMPARISON:  CT head 06/07/2016. FINDINGS: MRI HEAD FINDINGS Brain: There are high values on diffusion-weighted imaging within the tail of the right caudate nucleus and along the lateral right frontal cortex of, insula and anterior right temporal lobe. ADC values are mildly decreased but less so than would be expected in the acute phase. The midline structures are normal. There is associated hyperintense T2 weighted signal in the distribution corresponding to the above-described diffusion  abnormalities. No mass lesion or midline shift. No hydrocephalus or extra-axial fluid collection. No age advanced or lobar predominant atrophy. Vascular: Major intracranial arterial and venous sinus flow voids are preserved. No evidence of chronic microhemorrhage or amyloid angiopathy. Skull and upper cervical spine: The visualized skull base, calvarium, upper cervical spine and extracranial soft tissues are normal. Sinuses/Orbits: No fluid levels or advanced mucosal thickening. No mastoid effusion. Normal orbits. MRA HEAD FINDINGS Intracranial internal carotid arteries: Normal. Anterior cerebral arteries: Absence of the right A1 segment is a normal variant. Otherwise normal. Middle cerebral arteries: Normal. Posterior communicating arteries: Present bilaterally. Posterior cerebral arteries: Normal. Basilar artery: Normal. Vertebral arteries: Codominant. Normal. Superior cerebellar arteries: Normal. Anterior inferior cerebellar arteries: Normal. Posterior inferior cerebellar arteries: Normal. IMPRESSION: 1. Areas of mild diffusion restriction within the right MCA territory, including the right frontal cortex, right insula and anterior right temporal cortex as well as the tail of the right caudate nucleus. ADC values are higher than typically seen in the acute setting, but this is still favored to indicate late acute to early  subacute ischemic infarct. 2. No hemorrhage or mass effect. These results will be called to the ordering clinician or representative by the Radiologist Assistant, and communication documented in the PACS or zVision Dashboard. Electronically Signed   By: Ulyses Jarred M.D.   On: 06/08/2016 03:45     Assessment/Plan   Generalized weakness Will have her work with physical therapy and occupational therapy team to help with gait training and muscle strengthening exercises.fall precautions. Skin care. Encourage to be out of bed.   Acute CVA Continue xarelto for stroke prophylaxis. Monitor her BP. Continue statin  Anasarca Secondary to ovarian cancer. Increased edema to arms and legs compared to last visit. Increase her lasix to 40 mg bid x 3 days and then 60 mg daily. Made changes to kcl as below. Check weight and bmp  Hypokalemia Currently on kcl 30 meq daily. Give her kcl 40 meq po x 1 now. Then, have her on kcl 20 meq bid x 5 days and then 30 meq daily. Monitor bmp 06/19/16  afib Rate controlled. Continue diltiazem 240 mg daily and metoprolol 50 mg bid for rate control. Continue xarelto for stroke prophylaxis. Off coumadin.   Blood loss anemia With diverticular bleed. Also on xarelto at present. Monitor h&h  Hyperlipidemia Continue lipitor 80 mg daily  Diverticular bleed No further bleed. Monitor clinically  Ovarian cancer S/p 1 cycle of chemotherapy. To follow up with oncology 06/23/16.   Protein calorie malnutrition RD to evaluate. Pressure ulcer prophylaxis.   Dm type 2 Lab Results  Component Value Date   HGBA1C 6.1 (H) 06/08/2016   Monitor cbg reading. Continue her glipizide and metformin, no changes made    Goals of care: short term rehabilitation   Labs/tests ordered: cbc, bmp  Family/ staff Communication: reviewed care plan with patient, her niece and nursing supervisor    Blanchie Serve, MD Internal Medicine [address], [place] [address] Cell Phone (Monday-Friday 8 am - 5 pm): [phone] On Call: [phone] and follow prompts after 5 pm and on weekends Office Phone: [phone] Office Fax: [phone]

## 2016-06-18 NOTE — Telephone Encounter (Signed)
Per staff message I have scheduled appt and notified the scheduler 

## 2016-06-23 ENCOUNTER — Encounter (HOSPITAL_COMMUNITY): Payer: Self-pay | Admitting: Interventional Radiology

## 2016-06-23 ENCOUNTER — Telehealth: Payer: Self-pay | Admitting: Oncology

## 2016-06-23 ENCOUNTER — Other Ambulatory Visit (HOSPITAL_BASED_OUTPATIENT_CLINIC_OR_DEPARTMENT_OTHER): Payer: Medicare Other

## 2016-06-23 ENCOUNTER — Ambulatory Visit (HOSPITAL_COMMUNITY)
Admission: RE | Admit: 2016-06-23 | Discharge: 2016-06-23 | Disposition: A | Discharge status: Home / Self Care | Payer: Medicare Other | Source: Ambulatory Visit | Attending: Oncology | Admitting: Oncology

## 2016-06-23 ENCOUNTER — Other Ambulatory Visit: Payer: Self-pay | Admitting: Oncology

## 2016-06-23 ENCOUNTER — Ambulatory Visit (HOSPITAL_BASED_OUTPATIENT_CLINIC_OR_DEPARTMENT_OTHER): Payer: Medicare Other | Admitting: Nurse Practitioner

## 2016-06-23 ENCOUNTER — Ambulatory Visit: Payer: Medicare Other

## 2016-06-23 VITALS — BP 106/63 | HR 50 | Temp 97.6°F | Resp 18 | Ht 66.0 in | Wt 180.4 lb

## 2016-06-23 DIAGNOSIS — C569 Malignant neoplasm of unspecified ovary: Secondary | ICD-10-CM | POA: Diagnosis present

## 2016-06-23 DIAGNOSIS — Z7901 Long term (current) use of anticoagulants: Secondary | ICD-10-CM | POA: Insufficient documentation

## 2016-06-23 DIAGNOSIS — I4891 Unspecified atrial fibrillation: Secondary | ICD-10-CM | POA: Diagnosis not present

## 2016-06-23 DIAGNOSIS — Z8673 Personal history of transient ischemic attack (TIA), and cerebral infarction without residual deficits: Secondary | ICD-10-CM | POA: Diagnosis not present

## 2016-06-23 DIAGNOSIS — R601 Generalized edema: Secondary | ICD-10-CM

## 2016-06-23 DIAGNOSIS — I10 Essential (primary) hypertension: Secondary | ICD-10-CM | POA: Diagnosis not present

## 2016-06-23 HISTORY — PX: IR GENERIC HISTORICAL: IMG1180011

## 2016-06-23 LAB — CBC WITH DIFFERENTIAL/PLATELET
BASO%: 0.7 % (ref 0.0–2.0)
Basophils Absolute: 0.1 10*3/uL (ref 0.0–0.1)
EOS%: 1.2 % (ref 0.0–7.0)
Eosinophils Absolute: 0.1 10*3/uL (ref 0.0–0.5)
HEMATOCRIT: 33.1 % — AB (ref 34.8–46.6)
HGB: 10.9 g/dL — ABNORMAL LOW (ref 11.6–15.9)
LYMPH#: 1.5 10*3/uL (ref 0.9–3.3)
LYMPH%: 13.3 % — AB (ref 14.0–49.7)
MCH: 25.5 pg (ref 25.1–34.0)
MCHC: 32.7 g/dL (ref 31.5–36.0)
MCV: 77.7 fL — ABNORMAL LOW (ref 79.5–101.0)
MONO#: 1 10*3/uL — AB (ref 0.1–0.9)
MONO%: 9.3 % (ref 0.0–14.0)
NEUT%: 75.5 % (ref 38.4–76.8)
NEUTROS ABS: 8.3 10*3/uL — AB (ref 1.5–6.5)
PLATELETS: 301 10*3/uL (ref 145–400)
RBC: 4.26 10*6/uL (ref 3.70–5.45)
RDW: 19.3 % — ABNORMAL HIGH (ref 11.2–14.5)
WBC: 11 10*3/uL — AB (ref 3.9–10.3)

## 2016-06-23 LAB — COMPREHENSIVE METABOLIC PANEL
ALK PHOS: 126 U/L (ref 40–150)
ALT: 15 U/L (ref 0–55)
ANION GAP: 13 meq/L — AB (ref 3–11)
AST: 25 U/L (ref 5–34)
Albumin: 2.3 g/dL — ABNORMAL LOW (ref 3.5–5.0)
BUN: 15.4 mg/dL (ref 7.0–26.0)
CHLORIDE: 97 meq/L — AB (ref 98–109)
CO2: 35 meq/L — AB (ref 22–29)
Calcium: 8 mg/dL — ABNORMAL LOW (ref 8.4–10.4)
Creatinine: 0.8 mg/dL (ref 0.6–1.1)
EGFR: 67 mL/min/{1.73_m2} — ABNORMAL LOW (ref >90)
GLUCOSE: 87 mg/dL (ref 70–140)
Potassium: 3.7 mEq/L (ref 3.5–5.1)
Sodium: 145 mEq/L (ref 136–145)
TOTAL PROTEIN: 6.3 g/dL — AB (ref 6.4–8.3)
Total Bilirubin: 0.95 mg/dL (ref 0.20–1.20)

## 2016-06-23 MED ORDER — LIDOCAINE-EPINEPHRINE (PF) 2 %-1:200000 IJ SOLN
INTRAMUSCULAR | Status: AC
  Filled 2016-06-23: qty 20

## 2016-06-23 MED ORDER — LIDOCAINE-EPINEPHRINE (PF) 2 %-1:200000 IJ SOLN
INTRAMUSCULAR | Status: DC | PRN
  Administered 2016-06-23: 5 mL via INTRADERMAL

## 2016-06-23 NOTE — Telephone Encounter (Signed)
sw pt son to inform of 12/5 appt per LOS. Son needed to confirm transportation with sister and will call back tomorrow morning to change time if needed

## 2016-06-23 NOTE — Procedures (Signed)
Successful placement of right brachial vein approach 38 cm single lumen PICC line with tip at the superior caval-atrial junction.   EBL: None No immediate post procedural complication. The PICC line is ready for immediate use.  Ronny Bacon, MD Pager #: [phone]

## 2016-06-23 NOTE — Progress Notes (Addendum)
[place] OFFICE PROGRESS NOTE   Diagnosis:  Ovarian cancer  INTERVAL HISTORY:   Yolanda James returns as scheduled. She had a PICC line placed earlier today. She denies nausea/vomiting. No mouth sores. No diarrhea. Appetite and activity level remain poor. She was able to stand with a walker today. She uses the bedpan. She reports edema is better.  Objective:  Vital signs in last 24 hours:  Blood pressure 106/63, pulse (!) 50, temperature 97.6 F (36.4 C), temperature source Oral, resp. rate 18, height _0  (1.676 m), weight 180 lb 6.4 oz (81.8 kg), SpO2 99 %.    HEENT: No thrush or ulcers. Resp: Diminished breath sounds left base. No respiratory distress. Cardio: Regular rate and rhythm. GI: Abdomen is mildly distended, soft. No hepatomegaly. Vascular: Trace edema at the lower legs bilaterally. Right upper extremity PICC.  Lab Results:  Lab Results  Component Value Date   WBC 11.0 (H) 06/23/2016   HGB 10.9 (L) 06/23/2016   HCT 33.1 (L) 06/23/2016   MCV 77.7 (L) 06/23/2016   PLT 301 06/23/2016   NEUTROABS 8.3 (H) 06/23/2016    Imaging:  Ir Fluoro Guide Cv Line Right  Result Date: 06/23/2016 INDICATION: History of ovarian cancer. In need of temporary though durable intravenous access for chemotherapy administration. EXAM: ULTRASOUND AND FLUOROSCOPIC GUIDED PICC LINE INSERTION MEDICATIONS: None. CONTRAST:  None FLUOROSCOPY TIME:  6 seconds (1.7 MGy) COMPLICATIONS: None immediate. TECHNIQUE: The procedure, risks, benefits, and alternatives were explained to the patient and informed written consent was obtained. A timeout was performed prior to the initiation of the procedure. The right upper extremity was prepped with chlorhexidine in a sterile fashion, and a sterile drape was applied covering the operative field. Maximum barrier sterile technique with sterile gowns and gloves were used for the procedure. A timeout was performed prior to the initiation of the  procedure. Local anesthesia was provided with 1% lidocaine. Under direct ultrasound guidance, the right brachial vein was accessed with a micropuncture kit after the overlying soft tissues were anesthetized with 1% lidocaine. An ultrasound image was saved for documentation purposes. A guidewire was advanced to the level of the superior caval-atrial junction for measurement purposes and the PICC line was cut to length. A peel-away sheath was placed and a 38 cm, 5 [place], single lumen was inserted to level of the superior caval-atrial junction. A post procedure spot fluoroscopic was obtained. The catheter easily aspirated and flushed and was sutured in place. A dressing was placed. The patient tolerated the procedure well without immediate post procedural complication. FINDINGS: After catheter placement, the tip lies within the superior cavoatrial junction. The catheter aspirates and flushes normally and is ready for immediate use. IMPRESSION: Successful ultrasound and fluoroscopic guided placement of a right brachial vein approach, 38 cm, 5 French, single lumen PICC with tip at the superior caval-atrial junction. The PICC line is ready for immediate use. Electronically Signed   By: Sandi Mariscal M.D.   On: 06/23/2016 10:48   Ir US Guide Vasc Access Right  Result Date: 06/23/2016 INDICATION: History of ovarian cancer. In need of temporary though durable intravenous access for chemotherapy administration. EXAM: ULTRASOUND AND FLUOROSCOPIC GUIDED PICC LINE INSERTION MEDICATIONS: None. CONTRAST:  None FLUOROSCOPY TIME:  6 seconds (1.7 MGy) COMPLICATIONS: None immediate. TECHNIQUE: The procedure, risks, benefits, and alternatives were explained to the patient and informed written consent was obtained. A timeout was performed prior to the initiation of the procedure. The right upper extremity was prepped with  chlorhexidine in a sterile fashion, and a sterile drape was applied covering the operative field. Maximum barrier  sterile technique with sterile gowns and gloves were used for the procedure. A timeout was performed prior to the initiation of the procedure. Local anesthesia was provided with 1% lidocaine. Under direct ultrasound guidance, the right brachial vein was accessed with a micropuncture kit after the overlying soft tissues were anesthetized with 1% lidocaine. An ultrasound image was saved for documentation purposes. A guidewire was advanced to the level of the superior caval-atrial junction for measurement purposes and the PICC line was cut to length. A peel-away sheath was placed and a 38 cm, 5 [place], single lumen was inserted to level of the superior caval-atrial junction. A post procedure spot fluoroscopic was obtained. The catheter easily aspirated and flushed and was sutured in place. A dressing was placed. The patient tolerated the procedure well without immediate post procedural complication. FINDINGS: After catheter placement, the tip lies within the superior cavoatrial junction. The catheter aspirates and flushes normally and is ready for immediate use. IMPRESSION: Successful ultrasound and fluoroscopic guided placement of a right brachial vein approach, 38 cm, 5 French, single lumen PICC with tip at the superior caval-atrial junction. The PICC line is ready for immediate use. Electronically Signed   By: Sandi Mariscal M.D.   On: 06/23/2016 10:48    Medications: I have reviewed the patient's current medications.  Assessment/Plan: 1. GYN malignancy presenting with Abdominal distention/anorexia ? CT of the abdomen/pelvis 05/07/2016 revealed ascites, omental caking, and a small left pleural effusion ? Elevated CA 125 ? Paracentesis 05/13/2016 with cytology positive for carcinoma ? Cycle 1 Taxol/carboplatin 05/23/2016 ? Cycle 2 Taxol/carboplatin 06/23/2016 (dose reduced, Taxol to be given on a day 1, day 8, day 15 schedule)  2. Atrial fibrillation -previously maintained on Coumadin, PT/INR  supratherapeutic on admission 05/31/2016, anticoagulation switched to Xarelto after an admission with a CVA 06/07/2016  3. Diabetes  4. Hypertension  5. Glaucoma  6. Pancytopenia following cycle 1 Taxol/carboplatin  7. Admission 05/31/2016 with fever in the setting of severe neutropenia, blood culture from 05/31/2016 positive for pseudomonas aeruginosa  8.  history of Dyspnea secondary to the distended abdomen, pleural effusions-improved  9.  history of rectal bleeding  10. Anemia-Red cell microcytosis predated chemotherapy, chronic GI bleeding?  11.  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  Anasarca, improved 06/23/2016  13.  Hypokalemia-likely secondary to furosemide   Disposition: Yolanda James appears improved. Plan to proceed with Taxol/carboplatin today as scheduled (dose reduced). She will return for day 8 Taxol in one week. We will see her in follow-up 06/30/2016 prior to chemotherapy. She will contact the office in the interim with any problems.  Patient seen with Dr. Benay Spice.  Ned Card ANP/GNP-BC   06/23/2016  12:38 PM  This was a shared visit with Ned Card. Yolanda James was interviewed and examined. We decided to hold chemotherapy today since she could not moved from a wheelchair into the treatment recliner. We had difficulty obtaining a weight. She will continue physical therapy and return for an office visit and planned treatment next week.  Julieanne Manson, M.D.

## 2016-06-24 ENCOUNTER — Ambulatory Visit (HOSPITAL_BASED_OUTPATIENT_CLINIC_OR_DEPARTMENT_OTHER): Payer: Medicare Other

## 2016-06-24 DIAGNOSIS — C569 Malignant neoplasm of unspecified ovary: Secondary | ICD-10-CM

## 2016-06-24 DIAGNOSIS — Z452 Encounter for adjustment and management of vascular access device: Secondary | ICD-10-CM

## 2016-06-24 MED ORDER — HEPARIN SOD (PORK) LOCK FLUSH 100 UNIT/ML IV SOLN
250.0000 [IU] | INTRAVENOUS | Status: DC | PRN
  Administered 2016-06-24: 14:00:00 via INTRAVENOUS
  Filled 2016-06-24: qty 5

## 2016-06-24 MED ORDER — SODIUM CHLORIDE 0.9% FLUSH
10.0000 mL | INTRAVENOUS | Status: DC | PRN
  Administered 2016-06-24: 10 mL via INTRAVENOUS
  Filled 2016-06-24: qty 10

## 2016-06-27 ENCOUNTER — Telehealth: Payer: Self-pay | Admitting: *Deleted

## 2016-06-27 ENCOUNTER — Telehealth: Payer: Self-pay | Admitting: Oncology

## 2016-06-27 ENCOUNTER — Other Ambulatory Visit: Payer: Self-pay | Admitting: *Deleted

## 2016-06-27 DIAGNOSIS — C569 Malignant neoplasm of unspecified ovary: Secondary | ICD-10-CM

## 2016-06-27 NOTE — Telephone Encounter (Signed)
Daughter called to f/u on the chemo reschedule for 06/30/16 that was ordered on 12/4 when the saw NP. It has not been scheduled at this time. She needs to know today to make transportation arrangements w/[facility]. Printed orders for treatment and had delivered to infusion room scheduler and notified collaborative nurse as well.

## 2016-06-27 NOTE — Telephone Encounter (Signed)
Labs, flush and follow up with Lattie Haw scheduled for 06/30/16. Email sent to [employer] via Outlook regarding request to add Infusion date of 06/30/16. Awaiting response from [facility], regarding patient being added for Infusion on 06/30/16. Patient will be called with appointment information, once confirmed/added   by Ubaldo Glassing. 06/27/16

## 2016-06-27 NOTE — Telephone Encounter (Signed)
Returned call to pt's daughter informed her of 12/11 appointments. She will notify [facility] to provide transportation.

## 2016-06-30 ENCOUNTER — Ambulatory Visit (HOSPITAL_BASED_OUTPATIENT_CLINIC_OR_DEPARTMENT_OTHER): Payer: Medicare Other

## 2016-06-30 ENCOUNTER — Ambulatory Visit: Payer: Medicare Other

## 2016-06-30 ENCOUNTER — Ambulatory Visit (HOSPITAL_BASED_OUTPATIENT_CLINIC_OR_DEPARTMENT_OTHER): Payer: Medicare Other | Admitting: Nurse Practitioner

## 2016-06-30 ENCOUNTER — Other Ambulatory Visit: Payer: Self-pay | Admitting: Oncology

## 2016-06-30 ENCOUNTER — Telehealth: Payer: Self-pay

## 2016-06-30 VITALS — Wt 195.2 lb

## 2016-06-30 VITALS — BP 90/58 | HR 67 | Temp 98.1°F | Resp 18 | Ht 66.0 in

## 2016-06-30 DIAGNOSIS — C569 Malignant neoplasm of unspecified ovary: Secondary | ICD-10-CM | POA: Diagnosis not present

## 2016-06-30 DIAGNOSIS — Z452 Encounter for adjustment and management of vascular access device: Secondary | ICD-10-CM | POA: Insufficient documentation

## 2016-06-30 DIAGNOSIS — E876 Hypokalemia: Secondary | ICD-10-CM | POA: Diagnosis not present

## 2016-06-30 DIAGNOSIS — R601 Generalized edema: Secondary | ICD-10-CM | POA: Diagnosis not present

## 2016-06-30 DIAGNOSIS — Z5111 Encounter for antineoplastic chemotherapy: Secondary | ICD-10-CM | POA: Diagnosis present

## 2016-06-30 LAB — CBC WITH DIFFERENTIAL/PLATELET
BASO%: 0.4 % (ref 0.0–2.0)
BASOS ABS: 0 10*3/uL (ref 0.0–0.1)
EOS ABS: 0 10*3/uL (ref 0.0–0.5)
EOS%: 0.3 % (ref 0.0–7.0)
HCT: 34.8 % (ref 34.8–46.6)
HGB: 11 g/dL — ABNORMAL LOW (ref 11.6–15.9)
LYMPH%: 14.6 % (ref 14.0–49.7)
MCH: 25.1 pg (ref 25.1–34.0)
MCHC: 31.6 g/dL (ref 31.5–36.0)
MCV: 79.6 fL (ref 79.5–101.0)
MONO#: 0.7 10*3/uL (ref 0.1–0.9)
MONO%: 8.7 % (ref 0.0–14.0)
NEUT#: 6.2 10*3/uL (ref 1.5–6.5)
NEUT%: 76 % (ref 38.4–76.8)
Platelets: 315 10*3/uL (ref 145–400)
RBC: 4.37 10*6/uL (ref 3.70–5.45)
RDW: 22.3 % — AB (ref 11.2–14.5)
WBC: 8.2 10*3/uL (ref 3.9–10.3)
lymph#: 1.2 10*3/uL (ref 0.9–3.3)

## 2016-06-30 LAB — COMPREHENSIVE METABOLIC PANEL
ALK PHOS: 134 U/L (ref 40–150)
ALT: 20 U/L (ref 0–55)
AST: 25 U/L (ref 5–34)
Albumin: 2.3 g/dL — ABNORMAL LOW (ref 3.5–5.0)
Anion Gap: 16 mEq/L — ABNORMAL HIGH (ref 3–11)
BUN: 22.3 mg/dL (ref 7.0–26.0)
CHLORIDE: 101 meq/L (ref 98–109)
CO2: 26 mEq/L (ref 22–29)
Calcium: 7.6 mg/dL — ABNORMAL LOW (ref 8.4–10.4)
Creatinine: 0.9 mg/dL (ref 0.6–1.1)
EGFR: 61 mL/min/{1.73_m2} — AB (ref >90)
GLUCOSE: 104 mg/dL (ref 70–140)
POTASSIUM: 3.2 meq/L — AB (ref 3.5–5.1)
SODIUM: 143 meq/L (ref 136–145)
Total Bilirubin: 1.21 mg/dL — ABNORMAL HIGH (ref 0.20–1.20)
Total Protein: 6.3 g/dL — ABNORMAL LOW (ref 6.4–8.3)

## 2016-06-30 MED ORDER — PALONOSETRON HCL INJECTION 0.25 MG/5ML
INTRAVENOUS | Status: AC
  Filled 2016-06-30: qty 5

## 2016-06-30 MED ORDER — FAMOTIDINE IN NACL 20-0.9 MG/50ML-% IV SOLN
20.0000 mg | Freq: Once | INTRAVENOUS | Status: AC
  Administered 2016-06-30: 20 mg via INTRAVENOUS

## 2016-06-30 MED ORDER — SODIUM CHLORIDE 0.9 % IV SOLN
253.8000 mg | Freq: Once | INTRAVENOUS | Status: AC
  Administered 2016-06-30: 250 mg via INTRAVENOUS
  Filled 2016-06-30: qty 25

## 2016-06-30 MED ORDER — SODIUM CHLORIDE 0.9 % IV SOLN
Freq: Once | INTRAVENOUS | Status: AC
  Administered 2016-06-30: 16:00:00 via INTRAVENOUS

## 2016-06-30 MED ORDER — DEXAMETHASONE SODIUM PHOSPHATE 100 MG/10ML IJ SOLN
20.0000 mg | Freq: Once | INTRAMUSCULAR | Status: AC
  Administered 2016-06-30: 20 mg via INTRAVENOUS
  Filled 2016-06-30: qty 2

## 2016-06-30 MED ORDER — HEPARIN SOD (PORK) LOCK FLUSH 100 UNIT/ML IV SOLN
250.0000 [IU] | Freq: Once | INTRAVENOUS | Status: AC | PRN
  Administered 2016-06-30: 250 [IU]
  Filled 2016-06-30: qty 5

## 2016-06-30 MED ORDER — SODIUM CHLORIDE 0.9% FLUSH
3.0000 mL | INTRAVENOUS | Status: DC | PRN
  Administered 2016-06-30: 10 mL via INTRAVENOUS
  Filled 2016-06-30: qty 10

## 2016-06-30 MED ORDER — SODIUM CHLORIDE 0.9 % IV SOLN
60.0000 mg/m2 | Freq: Once | INTRAVENOUS | Status: AC
  Administered 2016-06-30: 120 mg via INTRAVENOUS
  Filled 2016-06-30: qty 20

## 2016-06-30 MED ORDER — FAMOTIDINE IN NACL 20-0.9 MG/50ML-% IV SOLN
INTRAVENOUS | Status: AC
  Filled 2016-06-30: qty 50

## 2016-06-30 MED ORDER — SODIUM CHLORIDE 0.9% FLUSH
10.0000 mL | INTRAVENOUS | Status: DC | PRN
  Administered 2016-06-30: 10 mL
  Filled 2016-06-30: qty 10

## 2016-06-30 MED ORDER — DIPHENHYDRAMINE HCL 50 MG/ML IJ SOLN
INTRAMUSCULAR | Status: AC
  Filled 2016-06-30: qty 1

## 2016-06-30 MED ORDER — DIPHENHYDRAMINE HCL 50 MG/ML IJ SOLN
25.0000 mg | Freq: Once | INTRAMUSCULAR | Status: AC
  Administered 2016-06-30: 25 mg via INTRAVENOUS

## 2016-06-30 MED ORDER — PALONOSETRON HCL INJECTION 0.25 MG/5ML
0.2500 mg | Freq: Once | INTRAVENOUS | Status: AC
Start: 2016-06-30 — End: 2016-06-30
  Administered 2016-06-30: 0.25 mg via INTRAVENOUS

## 2016-06-30 NOTE — Progress Notes (Addendum)
  [facility] OFFICE PROGRESS NOTE   Diagnosis:  Ovarian cancer  INTERVAL HISTORY:   Yolanda James returns as scheduled. She completed cycle 1 Taxol/carboplatin 05/23/2016. Cycle 2 was held on 06/23/2016 since she could not be moved from a wheelchair into the treatment recliner and we had difficulty obtaining a weight.  She continues to feel weak. She is working with physical therapy. She is not yet walking. She does exercises in the bed. Appetite remains poor. No nausea or vomiting. Bowels moving. Leg swelling and abdominal distention continue to be improved.  Objective:  Vital signs in last 24 hours:  Blood pressure (!) 90/58, pulse 67, temperature 98.1 F (36.7 C), temperature source Oral, resp. rate 18, height 5\' 6"  (1.676 m), SpO2 100 %.    HEENT: No thrush or ulcers. Resp: Decreased breath sounds at the left base. No respiratory distress. Cardio: Irregular. GI: Abdomen is soft and nontender. No hepatomegaly. No apparent ascites. Vascular: Trace edema at the lower legs bilaterally. Right upper extremity PICC without erythema.  Lab Results:  Lab Results  Component Value Date   WBC 8.2 06/30/2016   HGB 11.0 (L) 06/30/2016   HCT 34.8 06/30/2016   MCV 79.6 06/30/2016   PLT 315 06/30/2016   NEUTROABS 6.2 06/30/2016    Imaging:  No results found.  Medications: I have reviewed the patient's current medications.  Assessment/Plan: 1. GYN malignancy presenting with Abdominal distention/anorexia ? CT of the abdomen/pelvis 05/07/2016 revealed ascites, omental caking, and a small left pleural effusion ? Elevated CA 125 ? Paracentesis 05/13/2016 with cytology positive for carcinoma ? Cycle 1 Taxol/carboplatin 05/23/2016 ? Cycle 2 Taxol/carboplatin (dose reduced, Taxol to be given on a day 1, day 8 day 15 schedule)  2. Atrial fibrillation -previously maintained on Coumadin, PT/INR supratherapeutic on admission 05/31/2016, anticoagulation switched to Xarelto  after an admission with a CVA 06/07/2016  3. Diabetes  4. Hypertension  5. Glaucoma  6. Pancytopeniafollowing cycle 1 Taxol/carboplatin  7. Admission 05/31/2016 with fever in the setting of severe neutropenia, blood culture from 05/31/2016 positive for pseudomonas aeruginosa  8. history of Dyspnea secondary to the distended abdomen, pleural effusions-improved  9. history of rectal bleeding  10. Anemia-Red cell microcytosis predated chemotherapy, chronic GI bleeding?  11. 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 Anasarca, improved 06/23/2016  13. Hypokalemia-likely secondary to furosemide   Disposition: [name] appears unchanged. She completed 1 cycle of every 3 week Taxol/carboplatin on 05/23/2016. Postchemotherapy course was complicated by fever/severe neutropenia with blood culture positive for pseudomonas. The plan is to resume treatment today with dose reduced carboplatin/Taxol. The Taxol will be given on a day 1/day 8/day 15 schedule. She will return for the day 8 Taxol on 07/07/2016. We will see her in follow-up on 07/16/2016 prior to the day 15 Taxol. She will contact the office in the interim with any problems.   Patient seen with Dr. Benay Spice.    Ned Card ANP/GNP-BC   06/30/2016  2:52 PM This was a shared visit with Ned Card. [name] was interviewed and examined. She continues physical therapy at the skilled nursing facility. We decided to proceed with dose reduced Taxol/carboplatin today. She will return for an office visit in 2 weeks. Chemotherapy orders were entered today.  Julieanne Manson, M.D.

## 2016-06-30 NOTE — Telephone Encounter (Signed)
[facility] to confirm pt taking potassium, spoke with patients nurse who states she is on 11meq daily. Will have NP at [facility] place review. Fax number received from nurse, labs and note from today faxed over. [phone]

## 2016-06-30 NOTE — Patient Instructions (Signed)
[facility] Discharge Instructions for Patients Receiving Chemotherapy  Today you received the following chemotherapy agents: Carbboplatin, Taxol    To help prevent nausea and vomiting after your treatment, we encourage you to take your nausea medication as prescribed.    If you develop nausea and vomiting that is not controlled by your nausea medication, call the clinic.   BELOW ARE SYMPTOMS THAT SHOULD BE REPORTED IMMEDIATELY:  *FEVER GREATER THAN 100.5 F  *CHILLS WITH OR WITHOUT FEVER  NAUSEA AND VOMITING THAT IS NOT CONTROLLED WITH YOUR NAUSEA MEDICATION  *UNUSUAL SHORTNESS OF BREATH  *UNUSUAL BRUISING OR BLEEDING  TENDERNESS IN MOUTH AND THROAT WITH OR WITHOUT PRESENCE OF ULCERS  *URINARY PROBLEMS  *BOWEL PROBLEMS  UNUSUAL RASH Items with * indicate a potential emergency and should be followed up as soon as possible.  Feel free to call the clinic you have any questions or concerns. The clinic phone number is [phone].  Please show the [facility] at check-in to the Emergency Department and triage nurse.  Paclitaxel injection What is this medicine? PACLITAXEL (PAK li TAX el) is a chemotherapy drug. It targets fast dividing cells, like cancer cells, and causes these cells to die. This medicine is used to treat ovarian cancer, breast cancer, and other cancers. This medicine may be used for other purposes; ask your health care provider or pharmacist if you have questions. COMMON BRAND NAME(S): Onxol, Taxol What should I tell my health care provider before I take this medicine? They need to know if you have any of these conditions: -blood disorders -irregular heartbeat -infection (especially a virus infection such as chickenpox, cold sores, or herpes) -liver disease -previous or ongoing radiation therapy -an unusual or allergic reaction to paclitaxel, alcohol, polyoxyethylated castor oil, other chemotherapy agents, other medicines, foods, dyes,  or preservatives -pregnant or trying to get pregnant -breast-feeding How should I use this medicine? This drug is given as an infusion into a vein. It is administered in a hospital or clinic by a specially trained health care professional. Talk to your pediatrician regarding the use of this medicine in children. Special care may be needed. Overdosage: If you think you have taken too much of this medicine contact a poison control center or emergency room at once. NOTE: This medicine is only for you. Do not share this medicine with others. What if I miss a dose? It is important not to miss your dose. Call your doctor or health care professional if you are unable to keep an appointment. What may interact with this medicine? Do not take this medicine with any of the following medications: -disulfiram -metronidazole This medicine may also interact with the following medications: -cyclosporine -diazepam -ketoconazole -medicines to increase blood counts like filgrastim, pegfilgrastim, sargramostim -other chemotherapy drugs like cisplatin, doxorubicin, epirubicin, etoposide, teniposide, vincristine -quinidine -testosterone -vaccines -verapamil Talk to your doctor or health care professional before taking any of these medicines: -acetaminophen -aspirin -ibuprofen -ketoprofen -naproxen This list may not describe all possible interactions. Give your health care provider a list of all the medicines, herbs, non-prescription drugs, or dietary supplements you use. Also tell them if you smoke, drink alcohol, or use illegal drugs. Some items may interact with your medicine. What should I watch for while using this medicine? Your condition will be monitored carefully while you are receiving this medicine. You will need important blood work done while you are taking this medicine. This medicine can cause serious allergic reactions. To reduce your risk you will  need to take other medicine(s) before  treatment with this medicine. If you experience allergic reactions like skin rash, itching or hives, swelling of the face, lips, or tongue, tell your doctor or health care professional right away. In some cases, you may be given additional medicines to help with side effects. Follow all directions for their use. This drug may make you feel generally unwell. This is not uncommon, as chemotherapy can affect healthy cells as well as cancer cells. Report any side effects. Continue your course of treatment even though you feel ill unless your doctor tells you to stop. Call your doctor or health care professional for advice if you get a fever, chills or sore throat, or other symptoms of a cold or flu. Do not treat yourself. This drug decreases your body's ability to fight infections. Try to avoid being around people who are sick. This medicine may increase your risk to bruise or bleed. Call your doctor or health care professional if you notice any unusual bleeding. Be careful brushing and flossing your teeth or using a toothpick because you may get an infection or bleed more easily. If you have any dental work done, tell your dentist you are receiving this medicine. Avoid taking products that contain aspirin, acetaminophen, ibuprofen, naproxen, or ketoprofen unless instructed by your doctor. These medicines may hide a fever. Do not become pregnant while taking this medicine. Women should inform their doctor if they wish to become pregnant or think they might be pregnant. There is a potential for serious side effects to an unborn child. Talk to your health care professional or pharmacist for more information. Do not breast-feed an infant while taking this medicine. Men are advised not to father a child while receiving this medicine. This product may contain alcohol. Ask your pharmacist or healthcare provider if this medicine contains alcohol. Be sure to tell all healthcare providers you are taking this medicine.  Certain medicines, like metronidazole and disulfiram, can cause an unpleasant reaction when taken with alcohol. The reaction includes flushing, headache, nausea, vomiting, sweating, and increased thirst. The reaction can last from 30 minutes to several hours. What side effects may I notice from receiving this medicine? Side effects that you should report to your doctor or health care professional as soon as possible: -allergic reactions like skin rash, itching or hives, swelling of the face, lips, or tongue -low blood counts - This drug may decrease the number of white blood cells, red blood cells and platelets. You may be at increased risk for infections and bleeding. -signs of infection - fever or chills, cough, sore throat, pain or difficulty passing urine -signs of decreased platelets or bleeding - bruising, pinpoint red spots on the skin, black, tarry stools, nosebleeds -signs of decreased red blood cells - unusually weak or tired, fainting spells, lightheadedness -breathing problems -chest pain -high or low blood pressure -mouth sores -nausea and vomiting -pain, swelling, redness or irritation at the injection site -pain, tingling, numbness in the hands or feet -slow or irregular heartbeat -swelling of the ankle, feet, hands Side effects that usually do not require medical attention (report to your doctor or health care professional if they continue or are bothersome): -bone pain -complete hair loss including hair on your head, underarms, pubic hair, eyebrows, and eyelashes -changes in the color of fingernails -diarrhea -loosening of the fingernails -loss of appetite -muscle or joint pain -red flush to skin -sweating This list may not describe all possible side effects. Call your doctor for medical  advice about side effects. You may report side effects to FDA at 1-800-FDA-1088. Where should I keep my medicine? This drug is given in a hospital or clinic and will not be stored at  home. NOTE: This sheet is a summary. It may not cover all possible information. If you have questions about this medicine, talk to your doctor, pharmacist, or health care provider.  2017 Elsevier/Gold Standard (2015-05-08 19:58:00) Carboplatin injection What is this medicine? CARBOPLATIN (KAR boe pla tin) is a chemotherapy drug. It targets fast dividing cells, like cancer cells, and causes these cells to die. This medicine is used to treat ovarian cancer and many other cancers. This medicine may be used for other purposes; ask your health care provider or pharmacist if you have questions. COMMON BRAND NAME(S): Paraplatin What should I tell my health care provider before I take this medicine? They need to know if you have any of these conditions: -blood disorders -hearing problems -kidney disease -recent or ongoing radiation therapy -an unusual or allergic reaction to carboplatin, cisplatin, other chemotherapy, other medicines, foods, dyes, or preservatives -pregnant or trying to get pregnant -breast-feeding How should I use this medicine? This drug is usually given as an infusion into a vein. It is administered in a hospital or clinic by a specially trained health care professional. Talk to your pediatrician regarding the use of this medicine in children. Special care may be needed. Overdosage: If you think you have taken too much of this medicine contact a poison control center or emergency room at once. NOTE: This medicine is only for you. Do not share this medicine with others. What if I miss a dose? It is important not to miss a dose. Call your doctor or health care professional if you are unable to keep an appointment. What may interact with this medicine? -medicines for seizures -medicines to increase blood counts like filgrastim, pegfilgrastim, sargramostim -some antibiotics like amikacin, gentamicin, neomycin, streptomycin, tobramycin -vaccines Talk to your doctor or health care  professional before taking any of these medicines: -acetaminophen -aspirin -ibuprofen -ketoprofen -naproxen This list may not describe all possible interactions. Give your health care provider a list of all the medicines, herbs, non-prescription drugs, or dietary supplements you use. Also tell them if you smoke, drink alcohol, or use illegal drugs. Some items may interact with your medicine. What should I watch for while using this medicine? Your condition will be monitored carefully while you are receiving this medicine. You will need important blood work done while you are taking this medicine. This drug may make you feel generally unwell. This is not uncommon, as chemotherapy can affect healthy cells as well as cancer cells. Report any side effects. Continue your course of treatment even though you feel ill unless your doctor tells you to stop. In some cases, you may be given additional medicines to help with side effects. Follow all directions for their use. Call your doctor or health care professional for advice if you get a fever, chills or sore throat, or other symptoms of a cold or flu. Do not treat yourself. This drug decreases your body's ability to fight infections. Try to avoid being around people who are sick. This medicine may increase your risk to bruise or bleed. Call your doctor or health care professional if you notice any unusual bleeding. Be careful brushing and flossing your teeth or using a toothpick because you may get an infection or bleed more easily. If you have any dental work done, tell your dentist  you are receiving this medicine. Avoid taking products that contain aspirin, acetaminophen, ibuprofen, naproxen, or ketoprofen unless instructed by your doctor. These medicines may hide a fever. Do not become pregnant while taking this medicine. Women should inform their doctor if they wish to become pregnant or think they might be pregnant. There is a potential for serious side  effects to an unborn child. Talk to your health care professional or pharmacist for more information. Do not breast-feed an infant while taking this medicine. What side effects may I notice from receiving this medicine? Side effects that you should report to your doctor or health care professional as soon as possible: -allergic reactions like skin rash, itching or hives, swelling of the face, lips, or tongue -signs of infection - fever or chills, cough, sore throat, pain or difficulty passing urine -signs of decreased platelets or bleeding - bruising, pinpoint red spots on the skin, black, tarry stools, nosebleeds -signs of decreased red blood cells - unusually weak or tired, fainting spells, lightheadedness -breathing problems -changes in hearing -changes in vision -chest pain -high blood pressure -low blood counts - This drug may decrease the number of white blood cells, red blood cells and platelets. You may be at increased risk for infections and bleeding. -nausea and vomiting -pain, swelling, redness or irritation at the injection site -pain, tingling, numbness in the hands or feet -problems with balance, talking, walking -trouble passing urine or change in the amount of urine Side effects that usually do not require medical attention (report to your doctor or health care professional if they continue or are bothersome): -hair loss -loss of appetite -metallic taste in the mouth or changes in taste This list may not describe all possible side effects. Call your doctor for medical advice about side effects. You may report side effects to FDA at 1-800-FDA-1088. Where should I keep my medicine? This drug is given in a hospital or clinic and will not be stored at home. NOTE: This sheet is a summary. It may not cover all possible information. If you have questions about this medicine, talk to your doctor, pharmacist, or health care provider.  2017 Elsevier/Gold Standard (2007-10-12  14:38:05)

## 2016-07-01 ENCOUNTER — Telehealth: Payer: Self-pay

## 2016-07-01 NOTE — Telephone Encounter (Signed)
Called [facility], spoke with [place]. requested there facility MD to review patients BP meds per NP request. Nurse states she called this morning stating pt has had diarrhea and was told to call immediatly if she had diarrhea within 3 days of chemo. Nurse states it is very liquidy stool and has had about 3 episodes. Ned Card, NP informed. Per NP observe patient, if it persists or worsens to call our office back. No imodium at this time. Robinson verbalized understanding and denies any further questions or concerns at this time.

## 2016-07-01 NOTE — Telephone Encounter (Signed)
-----   Message from Owens Shark, NP sent at 07/01/2016  9:40 AM EST ----- Please ask nursing home to consult with facility MD regarding need for continuing bp meds/adjusting doses

## 2016-07-03 ENCOUNTER — Inpatient Hospital Stay (HOSPITAL_COMMUNITY)
Admission: EM | Admit: 2016-07-03 | Discharge: 2016-07-21 | DRG: 208 | Disposition: E | Payer: Medicare Other | Attending: Pulmonary Disease | Admitting: Pulmonary Disease

## 2016-07-03 ENCOUNTER — Emergency Department (HOSPITAL_COMMUNITY): Payer: Medicare Other

## 2016-07-03 ENCOUNTER — Encounter (HOSPITAL_COMMUNITY): Payer: Self-pay | Admitting: Neurology

## 2016-07-03 DIAGNOSIS — H409 Unspecified glaucoma: Secondary | ICD-10-CM | POA: Diagnosis present

## 2016-07-03 DIAGNOSIS — R197 Diarrhea, unspecified: Secondary | ICD-10-CM | POA: Diagnosis present

## 2016-07-03 DIAGNOSIS — I482 Chronic atrial fibrillation: Secondary | ICD-10-CM | POA: Diagnosis present

## 2016-07-03 DIAGNOSIS — Z7984 Long term (current) use of oral hypoglycemic drugs: Secondary | ICD-10-CM

## 2016-07-03 DIAGNOSIS — J9600 Acute respiratory failure, unspecified whether with hypoxia or hypercapnia: Secondary | ICD-10-CM | POA: Diagnosis present

## 2016-07-03 DIAGNOSIS — Z515 Encounter for palliative care: Secondary | ICD-10-CM | POA: Diagnosis present

## 2016-07-03 DIAGNOSIS — I5032 Chronic diastolic (congestive) heart failure: Secondary | ICD-10-CM | POA: Diagnosis present

## 2016-07-03 DIAGNOSIS — Z8249 Family history of ischemic heart disease and other diseases of the circulatory system: Secondary | ICD-10-CM

## 2016-07-03 DIAGNOSIS — C569 Malignant neoplasm of unspecified ovary: Secondary | ICD-10-CM | POA: Diagnosis present

## 2016-07-03 DIAGNOSIS — Z96641 Presence of right artificial hip joint: Secondary | ICD-10-CM | POA: Diagnosis present

## 2016-07-03 DIAGNOSIS — G9341 Metabolic encephalopathy: Secondary | ICD-10-CM | POA: Diagnosis present

## 2016-07-03 DIAGNOSIS — D638 Anemia in other chronic diseases classified elsewhere: Secondary | ICD-10-CM | POA: Diagnosis present

## 2016-07-03 DIAGNOSIS — R531 Weakness: Secondary | ICD-10-CM | POA: Diagnosis present

## 2016-07-03 DIAGNOSIS — T451X5A Adverse effect of antineoplastic and immunosuppressive drugs, initial encounter: Secondary | ICD-10-CM | POA: Diagnosis present

## 2016-07-03 DIAGNOSIS — Z79899 Other long term (current) drug therapy: Secondary | ICD-10-CM

## 2016-07-03 DIAGNOSIS — D701 Agranulocytosis secondary to cancer chemotherapy: Secondary | ICD-10-CM | POA: Diagnosis present

## 2016-07-03 DIAGNOSIS — Z7901 Long term (current) use of anticoagulants: Secondary | ICD-10-CM

## 2016-07-03 DIAGNOSIS — R6521 Severe sepsis with septic shock: Secondary | ICD-10-CM | POA: Diagnosis not present

## 2016-07-03 DIAGNOSIS — E876 Hypokalemia: Secondary | ICD-10-CM | POA: Diagnosis present

## 2016-07-03 DIAGNOSIS — N179 Acute kidney failure, unspecified: Secondary | ICD-10-CM | POA: Diagnosis present

## 2016-07-03 DIAGNOSIS — E872 Acidosis: Secondary | ICD-10-CM | POA: Diagnosis present

## 2016-07-03 DIAGNOSIS — Z9842 Cataract extraction status, left eye: Secondary | ICD-10-CM

## 2016-07-03 DIAGNOSIS — M1990 Unspecified osteoarthritis, unspecified site: Secondary | ICD-10-CM | POA: Diagnosis present

## 2016-07-03 DIAGNOSIS — I110 Hypertensive heart disease with heart failure: Secondary | ICD-10-CM | POA: Diagnosis present

## 2016-07-03 DIAGNOSIS — J9690 Respiratory failure, unspecified, unspecified whether with hypoxia or hypercapnia: Secondary | ICD-10-CM

## 2016-07-03 DIAGNOSIS — A419 Sepsis, unspecified organism: Secondary | ICD-10-CM | POA: Diagnosis not present

## 2016-07-03 DIAGNOSIS — Z66 Do not resuscitate: Secondary | ICD-10-CM | POA: Diagnosis present

## 2016-07-03 DIAGNOSIS — E7800 Pure hypercholesterolemia, unspecified: Secondary | ICD-10-CM | POA: Diagnosis present

## 2016-07-03 DIAGNOSIS — R4182 Altered mental status, unspecified: Secondary | ICD-10-CM | POA: Diagnosis present

## 2016-07-03 DIAGNOSIS — Z91018 Allergy to other foods: Secondary | ICD-10-CM

## 2016-07-03 DIAGNOSIS — Z9841 Cataract extraction status, right eye: Secondary | ICD-10-CM

## 2016-07-03 DIAGNOSIS — I469 Cardiac arrest, cause unspecified: Secondary | ICD-10-CM | POA: Diagnosis present

## 2016-07-03 DIAGNOSIS — Z87891 Personal history of nicotine dependence: Secondary | ICD-10-CM

## 2016-07-03 DIAGNOSIS — Z9221 Personal history of antineoplastic chemotherapy: Secondary | ICD-10-CM

## 2016-07-03 DIAGNOSIS — Z961 Presence of intraocular lens: Secondary | ICD-10-CM | POA: Diagnosis present

## 2016-07-03 DIAGNOSIS — Z8673 Personal history of transient ischemic attack (TIA), and cerebral infarction without residual deficits: Secondary | ICD-10-CM

## 2016-07-03 DIAGNOSIS — E11649 Type 2 diabetes mellitus with hypoglycemia without coma: Secondary | ICD-10-CM | POA: Diagnosis present

## 2016-07-03 LAB — I-STAT ARTERIAL BLOOD GAS, ED
ACID-BASE DEFICIT: 11 mmol/L — AB (ref 0.0–2.0)
Bicarbonate: 14.6 mmol/L — ABNORMAL LOW (ref 20.0–28.0)
O2 Saturation: 100 %
PH ART: 7.282 — AB (ref 7.350–7.450)
PO2 ART: 253 mmHg — AB (ref 83.0–108.0)
Patient temperature: 100.2
TCO2: 15 mmol/L (ref 0–100)
pCO2 arterial: 31.2 mmHg — ABNORMAL LOW (ref 32.0–48.0)

## 2016-07-03 LAB — CBC WITH DIFFERENTIAL/PLATELET
BASOS PCT: 4 %
Basophils Absolute: 0.1 10*3/uL (ref 0.0–0.1)
EOS PCT: 1 %
Eosinophils Absolute: 0 10*3/uL (ref 0.0–0.7)
HEMATOCRIT: 29.8 % — AB (ref 36.0–46.0)
Hemoglobin: 8.9 g/dL — ABNORMAL LOW (ref 12.0–15.0)
LYMPHS ABS: 0.3 10*3/uL — AB (ref 0.7–4.0)
Lymphocytes Relative: 15 %
MCH: 25.4 pg — AB (ref 26.0–34.0)
MCHC: 29.9 g/dL — AB (ref 30.0–36.0)
MCV: 84.9 fL (ref 78.0–100.0)
MONO ABS: 0.1 10*3/uL (ref 0.1–1.0)
Monocytes Relative: 7 %
NEUTROS ABS: 1.3 10*3/uL — AB (ref 1.7–7.7)
Neutrophils Relative %: 73 %
Platelets: 132 10*3/uL — ABNORMAL LOW (ref 150–400)
RBC: 3.51 MIL/uL — ABNORMAL LOW (ref 3.87–5.11)
RDW: 21.5 % — AB (ref 11.5–15.5)
WBC Morphology: INCREASED
WBC: 1.8 10*3/uL — ABNORMAL LOW (ref 4.0–10.5)

## 2016-07-03 LAB — CBG MONITORING, ED
Glucose-Capillary: 136 mg/dL — ABNORMAL HIGH (ref 65–99)
Glucose-Capillary: 112 mg/dL — ABNORMAL HIGH (ref 65–99)
Glucose-Capillary: 154 mg/dL — ABNORMAL HIGH (ref 65–99)

## 2016-07-03 LAB — I-STAT CHEM 8, ED
BUN: 34 mg/dL — ABNORMAL HIGH (ref 6–20)
CALCIUM ION: 0.89 mmol/L — AB (ref 1.15–1.40)
CREATININE: 1.7 mg/dL — AB (ref 0.44–1.00)
Chloride: 107 mmol/L (ref 101–111)
GLUCOSE: 362 mg/dL — AB (ref 65–99)
HCT: 26 % — ABNORMAL LOW (ref 36.0–46.0)
HEMOGLOBIN: 8.8 g/dL — AB (ref 12.0–15.0)
POTASSIUM: 3.4 mmol/L — AB (ref 3.5–5.1)
Sodium: 145 mmol/L (ref 135–145)
TCO2: 17 mmol/L (ref 0–100)

## 2016-07-03 LAB — COMPREHENSIVE METABOLIC PANEL
ALK PHOS: 74 U/L (ref 38–126)
ALT: 25 U/L (ref 14–54)
AST: 55 U/L — AB (ref 15–41)
Albumin: 1.5 g/dL — ABNORMAL LOW (ref 3.5–5.0)
Anion gap: 18 — ABNORMAL HIGH (ref 5–15)
BUN: 37 mg/dL — AB (ref 6–20)
CALCIUM: 6.3 mg/dL — AB (ref 8.9–10.3)
CHLORIDE: 107 mmol/L (ref 101–111)
CO2: 18 mmol/L — AB (ref 22–32)
CREATININE: 1.98 mg/dL — AB (ref 0.44–1.00)
GFR calc Af Amer: 26 mL/min — ABNORMAL LOW (ref >60)
GFR calc non Af Amer: 22 mL/min — ABNORMAL LOW (ref >60)
Glucose, Bld: 391 mg/dL — ABNORMAL HIGH (ref 65–99)
Potassium: 3.4 mmol/L — ABNORMAL LOW (ref 3.5–5.1)
SODIUM: 143 mmol/L (ref 135–145)
Total Bilirubin: 0.8 mg/dL (ref 0.3–1.2)
Total Protein: 3.9 g/dL — ABNORMAL LOW (ref 6.5–8.1)

## 2016-07-03 LAB — LACTIC ACID, PLASMA: Lactic Acid, Venous: 8.9 mmol/L (ref 0.5–1.9)

## 2016-07-03 LAB — URINALYSIS, ROUTINE W REFLEX MICROSCOPIC
Bilirubin Urine: NEGATIVE
GLUCOSE, UA: NEGATIVE mg/dL
HGB URINE DIPSTICK: NEGATIVE
KETONES UR: NEGATIVE mg/dL
LEUKOCYTES UA: NEGATIVE
NITRITE: NEGATIVE
PROTEIN: 100 mg/dL — AB
Specific Gravity, Urine: 1.023 (ref 1.005–1.030)
pH: 5 (ref 5.0–8.0)

## 2016-07-03 LAB — I-STAT TROPONIN, ED: Troponin i, poc: 0.09 ng/mL (ref 0.00–0.08)

## 2016-07-03 LAB — I-STAT CG4 LACTIC ACID, ED: Lactic Acid, Venous: 10.31 mmol/L (ref 0.5–1.9)

## 2016-07-03 LAB — PROCALCITONIN: Procalcitonin: 32.78 ng/mL

## 2016-07-03 LAB — CORTISOL: Cortisol, Plasma: 66.6 ug/dL

## 2016-07-03 LAB — GLUCOSE, CAPILLARY: GLUCOSE-CAPILLARY: 132 mg/dL — AB (ref 65–99)

## 2016-07-03 MED ORDER — PHENYLEPHRINE HCL 10 MG/ML IJ SOLN
INTRAMUSCULAR | Status: AC | PRN
  Administered 2016-07-03 (×3): 100 ug

## 2016-07-03 MED ORDER — SODIUM CHLORIDE 0.9 % IV BOLUS (SEPSIS)
1000.0000 mL | Freq: Once | INTRAVENOUS | Status: AC
  Administered 2016-07-03: 1000 mL via INTRAVENOUS

## 2016-07-03 MED ORDER — DORZOLAMIDE HCL 2 % OP SOLN
1.0000 [drp] | Freq: Two times a day (BID) | OPHTHALMIC | Status: DC
  Filled 2016-07-03: qty 10

## 2016-07-03 MED ORDER — FENTANYL CITRATE (PF) 100 MCG/2ML IJ SOLN
50.0000 ug | INTRAMUSCULAR | Status: DC | PRN
  Administered 2016-07-03: 50 ug via INTRAVENOUS
  Filled 2016-07-03: qty 2

## 2016-07-03 MED ORDER — ATROPINE SULFATE 1 % OP SOLN: 1.0000 [drp] | OPHTHALMIC | Status: DC | PRN

## 2016-07-03 MED ORDER — MIDAZOLAM HCL 2 MG/2ML IJ SOLN: 1.0000 mg | INTRAMUSCULAR | Status: DC | PRN

## 2016-07-03 MED ORDER — VANCOMYCIN HCL 10 G IV SOLR
1500.0000 mg | Freq: Once | INTRAVENOUS | Status: AC
  Administered 2016-07-03: 1500 mg via INTRAVENOUS
  Filled 2016-07-03: qty 1500

## 2016-07-03 MED ORDER — VANCOMYCIN HCL IN DEXTROSE 1-5 GM/200ML-% IV SOLN: 1000.0000 mg | INTRAVENOUS | Status: DC

## 2016-07-03 MED ORDER — MORPHINE SULFATE (PF) 2 MG/ML IV SOLN: 1.0000 mg | INTRAVENOUS | Status: DC | PRN

## 2016-07-03 MED ORDER — DEXTROSE 5 % IV SOLN
2.0000 g | Freq: Once | INTRAVENOUS | Status: AC
  Administered 2016-07-03: 2 g via INTRAVENOUS
  Filled 2016-07-03: qty 2

## 2016-07-03 MED ORDER — TIMOLOL MALEATE 0.5 % OP SOLN
1.0000 [drp] | Freq: Two times a day (BID) | OPHTHALMIC | Status: DC
  Filled 2016-07-03: qty 5

## 2016-07-03 MED ORDER — EPINEPHRINE PF 1 MG/10ML IJ SOSY
PREFILLED_SYRINGE | INTRAMUSCULAR | Status: AC | PRN
  Administered 2016-07-03: 1 mg via INTRAVENOUS

## 2016-07-03 MED ORDER — DEXTROSE IN LACTATED RINGERS 5 % IV SOLN
INTRAVENOUS | Status: DC
  Administered 2016-07-03: 11:00:00 via INTRAVENOUS

## 2016-07-03 MED ORDER — LATANOPROST 0.005 % OP SOLN
1.0000 [drp] | Freq: Every day | OPHTHALMIC | Status: DC
  Filled 2016-07-03: qty 2.5

## 2016-07-03 MED ORDER — AMIODARONE HCL 150 MG/3ML IV SOLN
INTRAVENOUS | Status: AC | PRN
  Administered 2016-07-03: 150 mg via INTRAVENOUS

## 2016-07-03 MED ORDER — FAMOTIDINE IN NACL 20-0.9 MG/50ML-% IV SOLN
20.0000 mg | INTRAVENOUS | Status: DC
Start: 2016-07-03 — End: 2016-07-03

## 2016-07-03 MED ORDER — CEFEPIME HCL 1 G IJ SOLR: 1.0000 g | INTRAMUSCULAR | Status: DC

## 2016-07-03 MED ORDER — ORAL CARE MOUTH RINSE: 15.0000 mL | Freq: Four times a day (QID) | OROMUCOSAL | Status: DC

## 2016-07-03 MED ORDER — DEXTROSE 50 % IV SOLN
INTRAVENOUS | Status: AC | PRN
  Administered 2016-07-03 (×3): 50 mL via INTRAVENOUS

## 2016-07-03 MED ORDER — FENTANYL CITRATE (PF) 100 MCG/2ML IJ SOLN
50.0000 ug | Freq: Once | INTRAMUSCULAR | Status: AC
  Administered 2016-07-03: 50 ug via INTRAVENOUS

## 2016-07-03 MED ORDER — CHLORHEXIDINE GLUCONATE 0.12% ORAL RINSE (MEDLINE KIT): 15.0000 mL | Freq: Two times a day (BID) | OROMUCOSAL | Status: DC

## 2016-07-03 MED ORDER — PILOCARPINE HCL 4 % OP SOLN
1.0000 [drp] | Freq: Four times a day (QID) | OPHTHALMIC | Status: DC
  Filled 2016-07-03: qty 15

## 2016-07-03 MED ORDER — DEXTROSE 5 % IV SOLN
0.0000 ug/min | Freq: Once | INTRAVENOUS | Status: AC
  Administered 2016-07-03: 10 ug/min via INTRAVENOUS

## 2016-07-03 MED ORDER — PHENYLEPHRINE HCL 10 MG/ML IJ SOLN
30.0000 ug/min | INTRAVENOUS | Status: DC
  Administered 2016-07-03: 30 ug/min via INTRAVENOUS
  Filled 2016-07-03: qty 1

## 2016-07-03 MED ORDER — FENTANYL CITRATE (PF) 100 MCG/2ML IJ SOLN
INTRAMUSCULAR | Status: AC
  Filled 2016-07-03: qty 2

## 2016-07-03 MED FILL — Medication: Qty: 1 | Status: AC

## 2016-07-04 ENCOUNTER — Ambulatory Visit: Payer: Medicare Other

## 2016-07-04 ENCOUNTER — Telehealth: Payer: Self-pay | Admitting: *Deleted

## 2016-07-04 ENCOUNTER — Other Ambulatory Visit: Payer: Medicare Other

## 2016-07-04 LAB — URINE CULTURE: CULTURE: NO GROWTH

## 2016-07-04 NOTE — Telephone Encounter (Signed)
Mrs Puls's son, Maudie Mercury called to let Dr Benay Spice know that patient died yesterday at [facility].

## 2016-07-05 ENCOUNTER — Other Ambulatory Visit: Payer: Self-pay | Admitting: Nurse Practitioner

## 2016-07-07 ENCOUNTER — Other Ambulatory Visit: Payer: Medicare Other

## 2016-07-07 ENCOUNTER — Telehealth: Payer: Self-pay

## 2016-07-07 ENCOUNTER — Ambulatory Visit: Payer: Medicare Other

## 2016-07-07 NOTE — Telephone Encounter (Signed)
On 07/07/2016 I received a death certificate from [facility] (original). The death certificate is for burial. The patient is a patient of Doctor Sood. The death certificate will be taken to [facility] ([id number]) this am for signature.  On 2016/07/25 I received the death certificate back from Doctor Sharon. I got the death certificate ready and called the funeral home to let them know the death certificate is ready for pickup.

## 2016-07-08 LAB — CULTURE, BLOOD (ROUTINE X 2)
CULTURE: NO GROWTH
CULTURE: NO GROWTH

## 2016-07-16 ENCOUNTER — Ambulatory Visit: Payer: Medicare Other

## 2016-07-16 ENCOUNTER — Ambulatory Visit: Payer: Medicare Other | Admitting: Oncology

## 2016-07-16 ENCOUNTER — Other Ambulatory Visit: Payer: Medicare Other

## 2016-07-21 NOTE — Code Documentation (Signed)
CBG reading LOW. Dr. Alvino Chapel made aware, will give another amp of D50.

## 2016-07-21 NOTE — ED Notes (Signed)
CBG 136 

## 2016-07-21 NOTE — Progress Notes (Signed)
PCCM Interval Progress Note  Pt arrived to unit from ED.  HR in 160's and BP unable to be obtained both through machine and manually.  Chart reviewed - family members were gathering for code discussions.  Pt's son, daughter in law, and sister now in conference room.  Extensive discussion with them regarding the recent events, current circumstances, and poor prognosis for Yolanda James.  They inform me that she has advanced directives in place already which state that she would never want to go through heroic or extreme measures.   During our discussion, pt's pulse became very thready and weak.  Family taken from conference room into pt's room and upon their arrival at bedside, they have decided to terminally extubate pt and offer full comfort care.  They have been fully updated on the process and expectations and all questions have been answered.   Additional CC time: 25 minutes.   Montey Hora, [facility] Pager: [phone]  or [phone] 07-12-2016, 11:52 AM

## 2016-07-21 NOTE — Progress Notes (Signed)
Pt noted to be apneic, with no pulse, respirations, or BP. Family is at the bedside. Mable Fill is at the bedside. [facility] has been notified, pt is not suitable for donation. TOD [id number] pronounced by Ellard Artis, RN and Liborio Nixon Minor, Therapist, sports.

## 2016-07-21 NOTE — ED Notes (Signed)
Pt daughter is at bedside. Is opening her eyes, responding to commands. Spontaneous movement of both arms.

## 2016-07-21 NOTE — H&P (Signed)
PULMONARY / CRITICAL CARE MEDICINE   Name: Yolanda James MRN: [id number] DOB: February 20, 1931    ADMISSION DATE:  07/14/2016   REFERRING MD:  EDP  CHIEF COMPLAINT: Hypoglycemia   HISTORY OF PRESENT ILLNESS:   83 yo WF diagnosed with ovarian cancer and started on Taxol/carbopltin 11/3 and repeated 11/18. She was recently admitted to [facility] with left stroke that left mild left arm weakness. She was discharged to SNF and developed diarrhea, hypoglycemia, hypotension, lactic acidosis and was transferred to [facility] ED 12/14. She was unresponsive , glucose <10, hypotensive, Afib rate >160 and was intubated, shocked and given amio bolus. Glucose was treated with D50. Family is gathering for EOL discussions. She will be admitted ti ICU as a full code for now.   PAST MEDICAL HISTORY :  She  has a past medical history of Acute blood loss anemia; Chronic a-fib ([place]); Chronic diastolic CHF (congestive heart failure) ([place]); Essential hypertension; Glaucoma; Hypokalemia; Long term current use of anticoagulant therapy; Osteoarthritis; Pure hypercholesterolemia; Slow transit constipation; and Type 2 diabetes mellitus without complication, with long-term current use of insulin ([place]).  PAST SURGICAL HISTORY: She  has a past surgical history that includes Ganglion cyst excision (Left, 1965); Eye surgery; vaginal births; Total hip arthroplasty (Right, 09/03/2015); ir generic historical (06/23/2016); and ir generic historical (06/23/2016).  Allergies  Allergen Reactions  . Orange Fruit [Citrus]     No current facility-administered medications on file prior to encounter.    Current Outpatient Prescriptions on File Prior to Encounter  Medication Sig  . atorvastatin (LIPITOR) 80 MG tablet TAKE 1 TABLET(80 MG) BY MOUTH DAILY  . Coenzyme Q10 (CO Q 10) 100 MG CAPS Take 1 capsule by mouth daily.  Marland Kitchen diltiazem (CARDIZEM CD) 240 MG 24 hr capsule Take 1 capsule (240 mg total) by mouth daily.  . dorzolamide (TRUSOPT) 2 %  ophthalmic solution Place 1 drop into both eyes 2 (two) times daily.   . furosemide (LASIX) 40 MG tablet Take 1 tablet daily. May take additional dose once weekly for swelling.  Marland Kitchen glipiZIDE (GLUCOTROL XL) 2.5 MG 24 hr tablet Take 2.5 mg by mouth 2 (two) times daily.   Marland Kitchen latanoprost (XALATAN) 0.005 % ophthalmic solution Place 1 drop into both eyes at bedtime.  . metFORMIN (GLUCOPHAGE) 500 MG tablet Take 500 mg by mouth 2 (two) times daily with a meal.   . metoprolol tartrate (LOPRESSOR) 50 MG tablet Take 1 tablet (50 mg total) by mouth 2 (two) times daily.  . Multiple Vitamins-Minerals (ELDERTONIC PO) Take 15 mLs by mouth 2 (two) times daily.  . Omega-3 Fatty Acids (FISH OIL) 1000 MG CAPS Take 1,000 mg by mouth 2 (two) times daily.  . ondansetron (ZOFRAN) 8 MG tablet Take 1 tablet (8 mg total) by mouth every 8 (eight) hours as needed for nausea or vomiting.  . pilocarpine (PILOCAR) 4 % ophthalmic solution Place 1 drop into both eyes 4 (four) times daily.   . polyethylene glycol (MIRALAX / GLYCOLAX) packet Take 17 g by mouth daily as needed.   . potassium chloride (K-DUR,KLOR-CON) 10 MEQ tablet Take 30 mEq by mouth daily.  . rivaroxaban (XARELTO) 20 MG TABS tablet Take 1 tablet (20 mg total) by mouth daily with supper.  . timolol (TIMOPTIC) 0.5 % ophthalmic solution Place 1 drop into both eyes 2 (two) times daily.   Marland Kitchen UNABLE TO FIND Med Name: Med pass 120 mL 3 times daily    FAMILY HISTORY:  Her indicated that her mother is  deceased. She indicated that her father is deceased. She indicated that only one of her two sisters is alive. She indicated that her maternal grandmother is deceased. She indicated that her maternal grandfather is deceased. She indicated that her paternal grandmother is deceased. She indicated that her paternal grandfather is deceased.    SOCIAL HISTORY: She  reports that she has quit smoking. She has never used smokeless tobacco. She reports that she does not drink alcohol or  use drugs.  REVIEW OF SYSTEMS:   NA  SUBJECTIVE:  MOWF on vent  VITAL SIGNS: BP (!) 84/74   Pulse (!) 121   Temp 101.6 F (38.7 C) (Rectal)   Resp 18   Ht 5\' 6"  (1.676 m)   Wt 195 lb (88.5 kg)   SpO2 100%   BMI 31.47 kg/m   HEMODYNAMICS:    VENTILATOR SETTINGS: Vent Mode: PRVC FiO2 (%):  [50 %-100 %] 50 % Set Rate:  [15 bmp] 15 bmp Vt Set:  [480 mL-500 mL] 480 mL PEEP:  [5 cmH20] 5 cmH20  INTAKE / OUTPUT: No intake/output data recorded.  PHYSICAL EXAMINATION: General:  Elderly obese female Neuro: Follows commands with rt hand HEENT:  No JVD Cardiovascular: HSIR A fib rvr Lungs:decreased in bases Abdomen:  obese soft + bs Musculoskeletal:  Intact Skin: Warm and dry  LABS:  BMET  Recent Labs Lab 06/30/16 1350 Aug 02, 2016 0832 08/02/16 0852  NA 143 143 145  K 3.2* 3.4* 3.4*  CL  --  107 107  CO2 26 18*  --   BUN 22.3 37* 34*  CREATININE 0.9 1.98* 1.70*  GLUCOSE 104 391* 362*    Electrolytes  Recent Labs Lab 06/30/16 1350 2016-08-02 0832  CALCIUM 7.6* PENDING    CBC  Recent Labs Lab 06/30/16 1350 08-02-16 0832 08-02-16 0852  WBC 8.2 1.8*  --   HGB 11.0* 8.9* 8.8*  HCT 34.8 29.8* 26.0*  PLT 315 132*  --     Coag's No results for input(s): APTT, INR in the last 168 hours.  Sepsis Markers  Recent Labs Lab Aug 02, 2016 0859  LATICACIDVEN 10.31*    ABG  Recent Labs Lab 02-Aug-2016 0842  PHART 7.282*  PCO2ART 31.2*  PO2ART 253.0*    Liver Enzymes  Recent Labs Lab 06/30/16 1350 08-02-2016 0832  AST 25 55*  ALT 20 PENDING  ALKPHOS 134 74  BILITOT 1.21* 0.8  ALBUMIN 2.3* 1.5*    Cardiac Enzymes No results for input(s): TROPONINI, PROBNP in the last 168 hours.  Glucose  Recent Labs Lab 08-02-2016 0812 08-02-16 0828 08-02-2016 0900  GLUCAP <10* 136* 112*    Imaging Dg Chest Portable 1 View  Result Date: August 02, 2016 CLINICAL DATA:  CPR EXAM: PORTABLE CHEST 1 VIEW COMPARISON:  06/07/2016 FINDINGS: Endotracheal tube is 4  cm above the carina. Right PICC line tip is in the SVC. There is mild cardiomegaly, peribronchial thickening and interstitial prominence. Focal left lower lobe atelectasis or infiltrate with possible small left effusion. No confluent opacity on the right. IMPRESSION: Endotracheal tube 4 cm above the carina. Right PICC line tip in the SVC Mild peribronchial thickening and interstitial prominence. Left lower lobe atelectasis or infiltrate with small left effusion. Electronically Signed   By: Rolm Baptise M.D.   On: 08-02-2016 09:01     STUDIES:  12/14 procal>>  CULTURES: 12/14 bc>> 12/14 Sputum>> 12/14 c dif>>  ANTIBIOTICS: 12/14 vanc>> 12/14 maxipime>>  SIGNIFICANT EVENTS: 12/14 intubated  LINES/TUBES: 12/14 et>> Rt picc>>  DISCUSSION: 83 yo  WF diagnosed with ovarian cancer and started on Taxol/carbopltin 11/3 and repeated 11/18. She was recently admitted to [facility] with left stroke that left mild left arm weakness. She was discharged to SNF and developed diarrhea, hypoglycemia, hypotension, lactic acidosis and was transferred to [facility] ED 12/14. She was unresponsive , glucose <10, hypotensive, Afib rate >160 and was intubated, shocked and given amio bolus. Glucose was treated with D50. Family is gathering for EOL discussions. She will be admitted ti ICU as a full code for now.  ASSESSMENT / PLAN:  PULMONARY A: Intubated secondary to AMS P:   Wean as tolerated  CARDIOVASCULAR A:  CAF on xatrleto, Cardizem,lopressor, lasix Hypotension in setting of sepsis    P:  Rate control with amio and lopressor along with Volume resuscitation Pressors as needed with neo  RENAL Lab Results  Component Value Date   CREATININE 1.70 (H) July 11, 2016   CREATININE 1.98 (H) 11-Jul-2016   CREATININE 0.9 06/30/2016   CREATININE 0.8 06/23/2016   CREATININE 0.7 06/17/2016    A:   Renal insuff Lactic acidosis in setting of sepsis P:   Hydration Follow creatine Follow lactic  acid  GASTROINTESTINAL A:   Recent diarrhea post chemo  P:   Check c dif PPI  HEMATOLOGIC A:   Chronic anticoagulation for CAF with xarelto  P:  Check Coags  INFECTIOUS A:   Presumed infection in setting of of chemotherapy/immunosuppresion . Recent pseudomonas baacteremia P:   #0 vanc/maxi Pan culture  ENDOCRINE A:   NIDM on metformin and glipizide  Hypoglycemia(suspect in setting of poor po intake with continued DM medications.  P:   Hold DM meds CBG SSI if needed Dextrose in IV  NEUROLOGIC A:   Decreased LOC Recent Left sided stroke with left arm weakness in setting of CAF P:   RASS goal:0-1 No CT head at his time  CCT: 45 min   FAMILY  - Updates: One daughter updated at bedside per Dr. Halford Chessman. Family is gathering for goals of care discussion. Currently she is a full code.  - Inter-disciplinary family meet or Palliative Care meeting due by:  day 7    [facility] ACNP Maryanna Shape PCCM Pager [phone] till 3 pm If no answer page [phone] 07/11/2016, 9:47 AM  83 yo female with ovarian cancer on chemo presented with diarrhea, fever, hypotension, tachycardia.  Has elevated lactic acid.  Required intubation.  Opens eyes.  HR irregular.  No wheeze.  Abd soft.  Ext cool.    CXR - no ASD  WBC 1.8, Hb 8.8, Creatinine 1.98, LA 10.3  Assessment/plan:  Septic shock. Leukopenia. Diarrhea. Lactic acidosis. - IV fluids - pressors - Abx - check stool GI PCR and C diff PCR  Goals of care. - d/w family when they arrive.   CC time by me independent of APP time 40 minutes.   Chesley Mires, MD [facility] Pulmonary/Critical Care 07-11-2016, 11:32 AM Pager:  [phone] After 3pm call: [phone]

## 2016-07-21 NOTE — ED Notes (Signed)
Critical Care at bedside talking with daughter.

## 2016-07-21 NOTE — Discharge Summary (Signed)
Yolanda James was a 83 y.o. female admitted from SNF on 02-Aug-2016 diarrhea.  She was found to have hypoglycemia, hypotension, and lactic acidosis.  She required intubation.  She developed a fib with RVR and underwent cardioversion in the ER.  She was then started on amiodarone.  She was given IV dextrose.  She was started on antibiotics.  She was admitted to ICU.  Her family arrived, and goals of care discussed.  They were in agreement that she would not want this type of care.  She was made DNR and transitioned to comfort care.  She expired on 08-02-16 at 12:04 pm.   Final diagnoses: Acute respiratory failure Septic shock A fib with RVR Ovarian cancer Chemotherapy induced leukopenia Hypoglycemia Lactic acidosis Acute metabolic encephalopathy Diarrhea Hx of diabetes mellitus Chronic atrial fibrillation on xarelto as outpatient Hx of CVA Anemia of chronic disease and critical illness Hypokalemia Acute kidney injury  Chesley Mires, MD [place] 07/04/2016, 4:05 PM

## 2016-07-21 NOTE — ED Triage Notes (Signed)
Per ems- Pt comes from [place], staff checked on her this morning and found her unresponsive, RN checked CBG 47, gave glucagon, increased to 65. EMS CBG 91, LSN last night. Has history of cervical cancer, and is undergoing chemo.

## 2016-07-21 NOTE — Progress Notes (Signed)
Sepsis - Repeat Assessment  Performed at:    18-Jul-2016, 9:52 AM  Vitals     Blood pressure (!) 84/74, pulse (!) 121, temperature 101.6 F (38.7 C), temperature source Rectal, resp. rate 18, height 5\' 6"  (1.676 m), weight 195 lb (88.5 kg), SpO2 100 %.  Heart:     Irregular rate and rhythm  Lungs:    CTA  Capillary Refill:   > 2 sec  Peripheral Pulse:   Radial pulse palpable  Skin:     Pale   Chesley Mires, MD [place] 07/18/2016, 9:52 AM Pager:  [phone] After 3pm call: [phone]

## 2016-07-21 NOTE — Code Documentation (Signed)
Pt arrived, unresponsive, HR 153, unable to palpate pulses. CPR started.

## 2016-07-21 NOTE — Progress Notes (Signed)
Responded to trauma page to ED to support patient and family. Daughter at bedside. Patient came from SNF and reported to ED unresponsive. Per daughter, patient has terminal cancer and recently had treatment and she felt that pt. would have to return to hospital. Patient has two other sons. One son is in route to hospital and other in [place]. Daughter at bedside with critical care  Team. Provided emotional and spiritual support, ministry of presence and. Information sharing between patient daughter and staff. Will follow as needed.   29-Jul-2016 0918  Clinical Encounter Type  Visited With Patient;Family;Patient and family together;Health care provider  Visit Type Initial;Spiritual support;Code;ED;Patient actively dying  Referral From Nurse  Spiritual Encounters  Spiritual Needs Emotional  Stress Factors  Family Stress Factors Major life changes  Jaclynn Major, Chaplain,ppager [phone]

## 2016-07-21 NOTE — Progress Notes (Signed)
Patient extubated per MD order per family request.  Patient tolerated well.

## 2016-07-21 NOTE — ED Notes (Signed)
Dr. Alvino Chapel calling patients daughter, Elgie Congo. Reports they are going to discuss plan of care with another family member. Daughter was at work during the time of the call.

## 2016-07-21 NOTE — Progress Notes (Signed)
Pharmacy Antibiotic Note  Yolanda James is a 83 y.o. female admitted on 07/29/16 with sepsis.  Pharmacy has been consulted for vancomycin and cefepime dosing. On admission, patient was hypotensive, hypoglycemia, tachycardic, and febrile Tmax 101.98F. -Scr 1.7, estimated CrCl 28.1  Plan: -Vanc 1500mg  x1 in ED, then 1g Q24H -Cefepime 2g x1 in ED, then 1g Q24H -F/u renal function, C&S, patient progress  Height: 5\' 6"  (167.6 cm) Weight: 195 lb (88.5 kg) IBW/kg (Calculated) : 59.3  Temp (24hrs), Avg:100.9 F (38.3 C), Min:100.2 F (37.9 C), Max:101.6 F (38.7 C)   Recent Labs Lab 06/30/16 1350 06/30/16 1350 07/29/2016 0832 07/29/16 0852 07-29-16 0859  WBC 8.2  --  1.8*  --   --   CREATININE  --  0.9 1.98* 1.70*  --   LATICACIDVEN  --   --   --   --  10.31*    Estimated Creatinine Clearance: 28.1 mL/min (by C-G formula based on SCr of 1.7 mg/dL (H)).    Allergies  Allergen Reactions  . Orange Fruit [Citrus]     Antimicrobials this admission: Vanc 12/14 >>  Cefepime 12/14 >>   Dose adjustments this admission: n/a  Microbiology results: 12/14 BCx: sent 12/14 UCx: sent 12/14 C. Diff: sent 12/14 GI panel: sent  Thank you for allowing pharmacy to be a part of this patient's care.  Myer Peer Grayland Ormond), PharmD  PGY1 Pharmacy Resident Pager: [phone] 07/29/16 10:22 AM

## 2016-07-21 NOTE — Progress Notes (Signed)
Responded to page to continue support to patient who I was with earlier in ED.  Patient passed and family request support. I provided ministry of presence, emotional, spiritual and grief support to daughter, son and daughter in law at bedside. Prayed with family and offered final blessing . Remained with family until they were ready for time alone with love one.  Chaplain Jaclynn Major, pager [phone]

## 2016-07-21 NOTE — ED Notes (Signed)
Paged PCCM to [phone] 

## 2016-07-21 NOTE — ED Provider Notes (Signed)
[facility] DEPT Provider Note   CSN: [id number] Arrival date & time: 07-30-2016  0800     History   Chief Complaint Chief Complaint  Patient presents with  . Respiratory Arrest   Level 5 caveat due to ams. HPI Yolanda James is a 83 y.o. female.  HPI Patient presented from rehabilitation/nursing home after being found unresponsive. Hypotensive for EMS found to have sugar later of 40. Intubated by EMS. Tachycardic. Has history of cervical cancer is on chemotherapy. Previous stroke.   Past Medical History:  Diagnosis Date  . Acute blood loss anemia   . Chronic a-fib ([place])    a. DCCV 02/05/2007. b. sotalol stopped 2012. c. managed with rate control.  . Chronic diastolic CHF (congestive heart failure) (HCC)    associated with AFIB RVR  . Essential hypertension   . Glaucoma   . Hypokalemia   . Long term current use of anticoagulant therapy   . Osteoarthritis    hands and knees, back & knees & "all over"   . Pure hypercholesterolemia   . Slow transit constipation   . Type 2 diabetes mellitus without complication, with long-term current use of insulin [facility])     Patient Active Problem List   Diagnosis Date Noted  . Septic shock ([place]) 07-30-2016  . PICC (peripherally inserted central catheter) in place 06/30/2016  . Leukocytosis   . Dysarthria due to cerebrovascular accident (CVA) ([place])   . Abdominal distention   . CVA (cerebral vascular accident) ([place])   . Hypokalemia   . Left-sided weakness 06/07/2016  . Facial droop 06/07/2016  . Sepsis ([place]) 06/03/2016  . Bacteremia due to Pseudomonas 06/03/2016  . Pancytopenia, acquired ([place])   . Bloody diarrhea 05/31/2016  . Gastrointestinal bleed 05/31/2016  . Drug-induced neutropenia ([place]) 05/31/2016  . Diarrhea 05/28/2016  . Hypomagnesemia 05/28/2016  . Ovarian cancer ([place]) 05/15/2016  . Ascites 05/15/2016  . Carcinomatosis ([place]) 05/15/2016  . Bilateral lower extremity edema 11/26/2015  . Arthritis of right hip  09/03/2015  . Primary osteoarthritis of right hip 09/02/2015  . Essential hypertension, benign   . Atrial fibrillation with RVR ([place])   . Pure hypercholesterolemia   . Encounter for therapeutic drug monitoring 08/18/2013  . Chronic atrial fibrillation ([place]) 05/26/2013    Past Surgical History:  Procedure Laterality Date  . EYE SURGERY     laser & cataracts removed & IOL both eyes, glaucoma surgery   . GANGLION CYST EXCISION Left 1965   wrist  . IR GENERIC HISTORICAL  06/23/2016   IR FLUORO GUIDE CV LINE RIGHT 06/23/2016 Sandi Mariscal, MD [facility]-INTER[facility] RAD  . IR GENERIC HISTORICAL  06/23/2016   IR US GUIDE VASC ACCESS RIGHT 06/23/2016 Sandi Mariscal, MD [facility]  . TOTAL HIP ARTHROPLASTY Right 09/03/2015   Procedure: TOTAL HIP ARTHROPLASTY;  Surgeon: Frederik Pear, MD;  Location: [facility];  Service: Orthopedics;  Laterality: Right;  . vaginal births     x3    OB History    No data available       Home Medications    Prior to Admission medications   Medication Sig Start Date End Date Taking? Authorizing Provider  atorvastatin (LIPITOR) 80 MG tablet TAKE 1 TABLET(80 MG) BY MOUTH DAILY 05/06/16   Jettie Booze, MD  Coenzyme Q10 (CO Q 10) 100 MG CAPS Take 1 capsule by mouth daily.    Historical Provider, MD  diltiazem (CARDIZEM CD) 240 MG 24 hr capsule Take 1 capsule (240 mg total) by mouth daily.  11/26/15   Brittainy M Simmons, PA-C  dorzolamide (TRUSOPT) 2 % ophthalmic solution Place 1 drop into both eyes 2 (two) times daily.  03/11/16   Historical Provider, MD  furosemide (LASIX) 40 MG tablet Take 1 tablet daily. May take additional dose once weekly for swelling. 02/05/16   Jettie Booze, MD  glipiZIDE (GLUCOTROL XL) 2.5 MG 24 hr tablet Take 2.5 mg by mouth 2 (two) times daily.  12/13/13   Historical Provider, MD  latanoprost (XALATAN) 0.005 % ophthalmic solution Place 1 drop into both eyes at bedtime.    Historical Provider, MD  metFORMIN (GLUCOPHAGE) 500 MG tablet Take 500 mg by  mouth 2 (two) times daily with a meal.  12/10/13   Historical Provider, MD  metoprolol tartrate (LOPRESSOR) 50 MG tablet Take 1 tablet (50 mg total) by mouth 2 (two) times daily. 06/05/16 09/03/16  Patrecia Pour, MD  Multiple Vitamins-Minerals (ELDERTONIC PO) Take 15 mLs by mouth 2 (two) times daily.    Historical Provider, MD  Omega-3 Fatty Acids (FISH OIL) 1000 MG CAPS Take 1,000 mg by mouth 2 (two) times daily.    Historical Provider, MD  ondansetron (ZOFRAN) 8 MG tablet Take 1 tablet (8 mg total) by mouth every 8 (eight) hours as needed for nausea or vomiting. 05/22/16   Ladell Pier, MD  pilocarpine (PILOCAR) 4 % ophthalmic solution Place 1 drop into both eyes 4 (four) times daily.  11/23/13   Historical Provider, MD  polyethylene glycol (MIRALAX / GLYCOLAX) packet Take 17 g by mouth daily as needed.     Historical Provider, MD  potassium chloride (K-DUR,KLOR-CON) 10 MEQ tablet Take 30 mEq by mouth daily.    Historical Provider, MD  rivaroxaban (XARELTO) 20 MG TABS tablet Take 1 tablet (20 mg total) by mouth daily with supper. 06/11/16   Domenic Polite, MD  timolol (TIMOPTIC) 0.5 % ophthalmic solution Place 1 drop into both eyes 2 (two) times daily.  03/13/16   Historical Provider, MD  UNABLE TO FIND Med Name: Med pass 120 mL 3 times daily    Historical Provider, MD    Family History Family History  Problem Relation Age of Onset  . Arrhythmia Sister   . Heart attack Sister     Social History Social History  Substance Use Topics  . Smoking status: Former Research scientist (life sciences)  . Smokeless tobacco: Never Used  . Alcohol use No     Allergies   Orange fruit [citrus]   Review of Systems Review of Systems  Unable to perform ROS: Intubated     Physical Exam Updated Vital Signs BP (!) 62/37   Pulse (!) 121   Temp 101.6 F (38.7 C) (Rectal)   Resp 11   Ht _0  (1.676 m)   Wt 195 lb (88.5 kg)   SpO2 100%   BMI 31.47 kg/m   Physical Exam  Constitutional: She appears well-developed.    HENT:  Head: Atraumatic.  Eyes:  Pupils 3 mm on left and 4 mm on right.  Neck: Neck supple.  Cardiovascular:  Irregular tachycardia  Pulmonary/Chest:  Intubated with equal breath sounds  Abdominal: She exhibits no distension.  Musculoskeletal: She exhibits edema.  Neurological:  Corneal reflex intact. No response to pain but did have some spontaneous movement of right arm.  Skin: Skin is warm. Capillary refill takes more than 3 seconds.     ED Treatments / Results  Labs (all labs ordered are listed, but only abnormal results are displayed) Labs Reviewed  COMPREHENSIVE METABOLIC PANEL - Abnormal; Notable for the following:       Result Value   Potassium 3.4 (*)    CO2 18 (*)    Glucose, Bld 391 (*)    BUN 37 (*)    Creatinine, Ser 1.98 (*)    Calcium 6.3 (*)    Total Protein 3.9 (*)    Albumin 1.5 (*)    AST 55 (*)    GFR calc non Af Amer 22 (*)    GFR calc Af Amer 26 (*)    Anion gap 18 (*)    All other components within normal limits  CBC WITH DIFFERENTIAL/PLATELET - Abnormal; Notable for the following:    WBC 1.8 (*)    RBC 3.51 (*)    Hemoglobin 8.9 (*)    HCT 29.8 (*)    MCH 25.4 (*)    MCHC 29.9 (*)    RDW 21.5 (*)    Platelets 132 (*)    Neutro Abs 1.3 (*)    Lymphs Abs 0.3 (*)    All other components within normal limits  URINALYSIS, ROUTINE W REFLEX MICROSCOPIC - Abnormal; Notable for the following:    Color, Urine AMBER (*)    APPearance HAZY (*)    Protein, ur 100 (*)    Bacteria, UA RARE (*)    Squamous Epithelial / LPF 0-5 (*)    All other components within normal limits  CBG MONITORING, ED - Abnormal; Notable for the following:    Glucose-Capillary <10 (*)    All other components within normal limits  I-STAT CHEM 8, ED - Abnormal; Notable for the following:    Potassium 3.4 (*)    BUN 34 (*)    Creatinine, Ser 1.70 (*)    Glucose, Bld 362 (*)    Calcium, Ion 0.89 (*)    Hemoglobin 8.8 (*)    HCT 26.0 (*)    All other components within  normal limits  I-STAT TROPOININ, ED - Abnormal; Notable for the following:    Troponin i, poc 0.09 (*)    All other components within normal limits  I-STAT ARTERIAL BLOOD GAS, ED - Abnormal; Notable for the following:    pH, Arterial 7.282 (*)    pCO2 arterial 31.2 (*)    pO2, Arterial 253.0 (*)    Bicarbonate 14.6 (*)    Acid-base deficit 11.0 (*)    All other components within normal limits  CBG MONITORING, ED - Abnormal; Notable for the following:    Glucose-Capillary 136 (*)    All other components within normal limits  I-STAT CG4 LACTIC ACID, ED - Abnormal; Notable for the following:    Lactic Acid, Venous 10.31 (*)    All other components within normal limits  CBG MONITORING, ED - Abnormal; Notable for the following:    Glucose-Capillary 112 (*)    All other components within normal limits  CBG MONITORING, ED - Abnormal; Notable for the following:    Glucose-Capillary 154 (*)    All other components within normal limits  CULTURE, BLOOD (ROUTINE X 2)  CULTURE, BLOOD (ROUTINE X 2)  URINE CULTURE  C DIFFICILE QUICK SCREEN W PCR REFLEX  GASTROINTESTINAL PANEL BY PCR, STOOL (REPLACES STOOL CULTURE)  LACTIC ACID, PLASMA  CORTISOL  PROCALCITONIN    EKG  EKG Interpretation None       Radiology Dg Chest Portable 1 View  Result Date: 07/27/2016 CLINICAL DATA:  CPR EXAM: PORTABLE CHEST 1 VIEW COMPARISON:  06/07/2016 FINDINGS:  Endotracheal tube is 4 cm above the carina. Right PICC line tip is in the SVC. There is mild cardiomegaly, peribronchial thickening and interstitial prominence. Focal left lower lobe atelectasis or infiltrate with possible small left effusion. No confluent opacity on the right. IMPRESSION: Endotracheal tube 4 cm above the carina. Right PICC line tip in the SVC Mild peribronchial thickening and interstitial prominence. Left lower lobe atelectasis or infiltrate with small left effusion. Electronically Signed   By: Rolm Baptise M.D.   On: 07-26-16 09:01      Procedures Procedures (including critical care time)  Medications Ordered in ED Medications  vancomycin (VANCOCIN) 1,500 mg in sodium chloride 0.9 % 500 mL IVPB (1,500 mg Intravenous New Bag/Given Jul 26, 2016 0926)  fentaNYL (SUBLIMAZE) 100 MCG/2ML injection (not administered)  phenylephrine (NEO-SYNEPHRINE) 10 mg in dextrose 5 % 250 mL (0.04 mg/mL) infusion (30 mcg/min Intravenous New Bag/Given 26-Jul-2016 1039)  dextrose 5 % in lactated ringers infusion ( Intravenous New Bag/Given 2016-07-26 1039)  famotidine (PEPCID) IVPB 20 mg premix (not administered)  fentaNYL (SUBLIMAZE) injection 50 mcg (50 mcg Intravenous Given 2016-07-26 1037)  midazolam (VERSED) injection 1 mg (not administered)  chlorhexidine gluconate (MEDLINE KIT) (PERIDEX) 0.12 % solution 15 mL (not administered)  MEDLINE mouth rinse (not administered)  dorzolamide (TRUSOPT) 2 % ophthalmic solution 1 drop (not administered)  latanoprost (XALATAN) 0.005 % ophthalmic solution 1 drop (not administered)  pilocarpine (PILOCAR) 4 % ophthalmic solution 1 drop (not administered)  timolol (TIMOPTIC) 0.5 % ophthalmic solution 1 drop (not administered)  ceFEPIme (MAXIPIME) 1 g in dextrose 5 % 50 mL IVPB (not administered)  vancomycin (VANCOCIN) IVPB 1000 mg/200 mL premix (not administered)  EPINEPHrine (ADRENALIN) 1 MG/10ML injection (1 mg Intravenous Given Jul 26, 2016 0805)  dextrose 50 % solution (50 mLs Intravenous Given July 26, 2016 0815)  amiodarone (CORDARONE) 150 mg in dextrose 5 % 100 mL bolus ( Intravenous Stopped 2016/07/26 0927)  phenylephrine (NEO-SYNEPHRINE) injection (100 mcg  Given July 26, 2016 0856)  norepinephrine (LEVOPHED) 4 mg in dextrose 5 % 250 mL (0.016 mg/mL) infusion (0 mcg/min Intravenous Stopped 07/26/2016 1048)  ceFEPIme (MAXIPIME) 2 g in dextrose 5 % 50 mL IVPB (0 g Intravenous Stopped 07/26/16 1009)  sodium chloride 0.9 % bolus 1,000 mL (0 mLs Intravenous Stopped 26-Jul-2016 0920)  sodium chloride 0.9 % bolus 1,000 mL (0 mLs  Intravenous Stopped 07-26-16 0920)  fentaNYL (SUBLIMAZE) injection 50 mcg (50 mcg Intravenous Given 07-26-2016 0850)  sodium chloride 0.9 % bolus 1,000 mL (0 mLs Intravenous Stopped 26-Jul-2016 1048)     Initial Impression / Assessment and Plan / ED Course  I have reviewed the triage vital signs and the nursing notes.  Pertinent labs & imaging results that were available during my care of the patient were reviewed by me and considered in my medical decision making (see chart for details).  Clinical Course     Patient presented after being found unresponsive. Initial glucose was low at nursing home but it improved. After checking here was back down to less than 10. Head total of 3 A of glucose given. It had improved. Patient had very weak palpable pulses ever lost and then CPR was started. Had a tachycardic A. fib. cardioverted due to PEA with an A. fib. This did not convert the rhythm to sinus and the patient's dad went to a faster A. fib or SVT. 150 of amiodarone given. Patient artery had a milligram of epinephrine and this likely did increase the rate. Patient had fluid pulses given. Eventually found to be febrile.  Had had push dose pressors of phenylephrine 3 times and was started on levo fed drip. X-ray showed possible infiltrate and patient was started empirically on cefepime and Vanco. Code sepsis have been called. Was given 3 L of fluid. Critical care had been notified. I also discussed the patient's daughter and son. There was some question of a CODE STATUS of state they're not had the actual discussion with her. Lab work showed mild anemia and somewhat low white count. She had chemotherapy 5 days ago. Lactic acid of 10. Patient doesn't report prognosis with all her comorbidities and lab abnormalities. Admitted to ICU.  CRITICAL CARE Performed by: Mackie Pai Total critical care time: 60 minutes Critical care time was exclusive of separately billable procedures and treating other  patients. Critical care was necessary to treat or prevent imminent or life-threatening deterioration. Critical care was time spent personally by me on the following activities: development of treatment plan with patient and/or surrogate as well as nursing, discussions with consultants, evaluation of patient's response to treatment, examination of patient, obtaining history from patient or surrogate, ordering and performing treatments and interventions, ordering and review of laboratory studies, ordering and review of radiographic studies, pulse oximetry and re-evaluation of patient's condition.    Final  Clinical Impressions(s) / ED Diagnoses   Final diagnoses:  Septic shock ([place])  PEA (Pulseless electrical activity) ([place])    New Prescriptions New Prescriptions   No medications on file     Davonna Belling, MD 07/16/2016 1102

## 2016-07-21 NOTE — Progress Notes (Signed)
Patient transported from ED to room 2M03 without any apparent complications. RT will continue to monitor.

## 2016-07-21 DEATH — deceased

## 2016-07-25 ENCOUNTER — Other Ambulatory Visit: Payer: Medicare Other

## 2016-07-25 ENCOUNTER — Ambulatory Visit: Payer: Medicare Other

## 2016-07-29 ENCOUNTER — Ambulatory Visit: Payer: Medicare Other | Admitting: Neurology

## 2017-09-25 IMAGING — MR MR MRA HEAD W/O CM
11 of 13 series · 32 of 48 positions shown · non-contrast
Comparison: CT head 06/07/2016.

CLINICAL DATA: Left facial droop, left hemi paresis and dysarthria

EXAM:
MRI HEAD WITHOUT CONTRAST
MRA HEAD WITHOUT CONTRAST
TECHNIQUE: Multiplanar, multiecho pulse sequences of the brain and surrounding
structures were obtained without intravenous contrast. Angiographic
images of the head were obtained using MRA technique without
contrast.

[Series 4: DWI · axial · 3.0mm · 0.94mm/px · z∈[-100,+46]mm · 6 of 99 slices shown (1 of 2)]
[im 1/99]
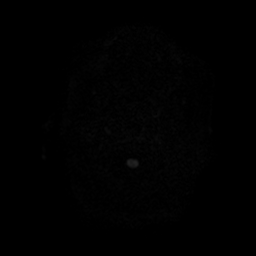
[im 20/99]
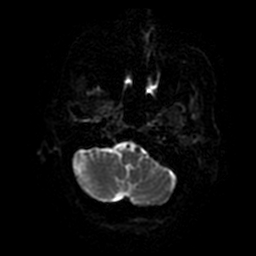
[im 40/99]
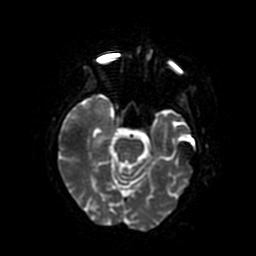
[im 59/99]
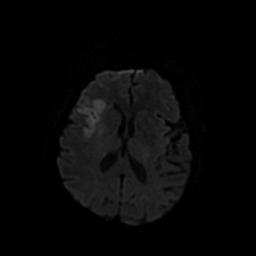
[im 79/99]
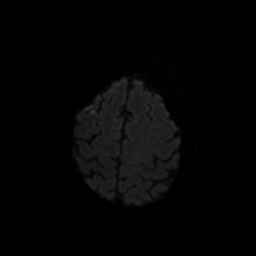
[im 99/99]
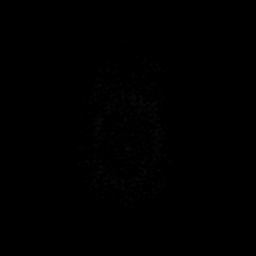

[Series 6: FLAIR · sagittal · 5.0mm · 0.47mm/px · 2 of 23 slices shown (1 of 4)]
[im 1/23]
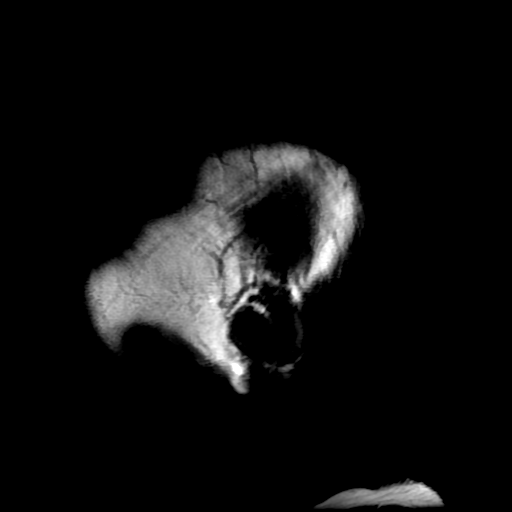
[im 23/23]
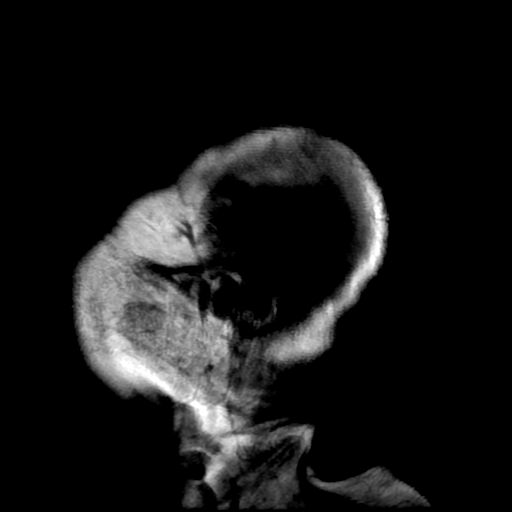

[Series 8: T2 · axial · 5.0mm · 0.47mm/px · z∈[-79,+62]mm · 2 of 25 slices shown (1 of 2)]
[im 1/25]
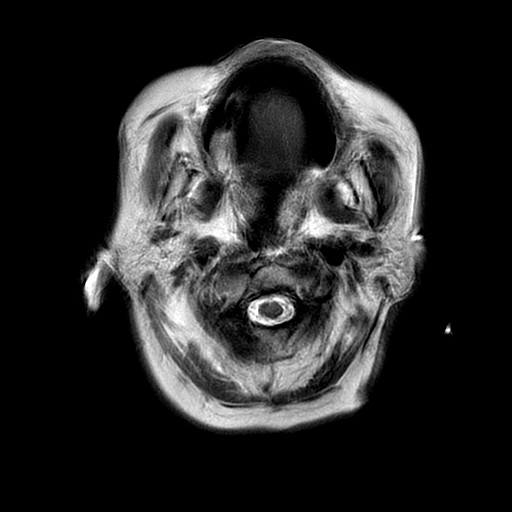
[im 25/25]
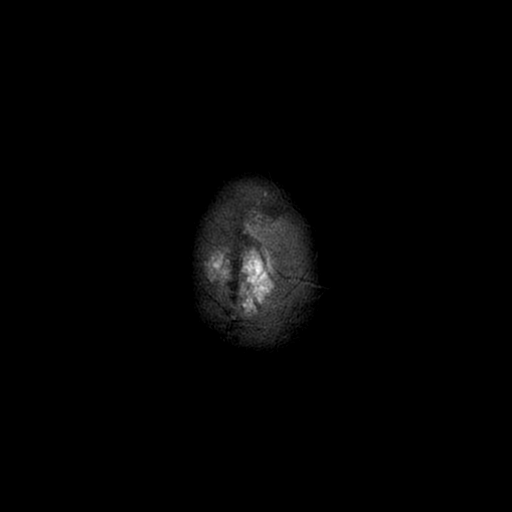

[Series 9: FLAIR · axial · 5.0mm · 0.94mm/px · z∈[-79,+62]mm · 2 of 25 slices shown (2 of 4)]
[im 1/25]
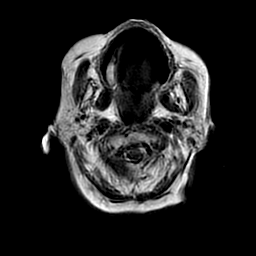
[im 25/25]
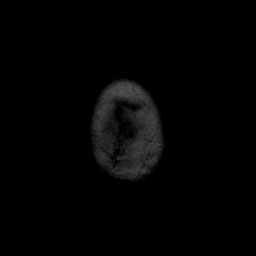

[Series 10: FLAIR · axial · 5.0mm · 0.47mm/px · z∈[-79,+62]mm · 2 of 25 slices shown (3 of 4)]
[im 1/25]
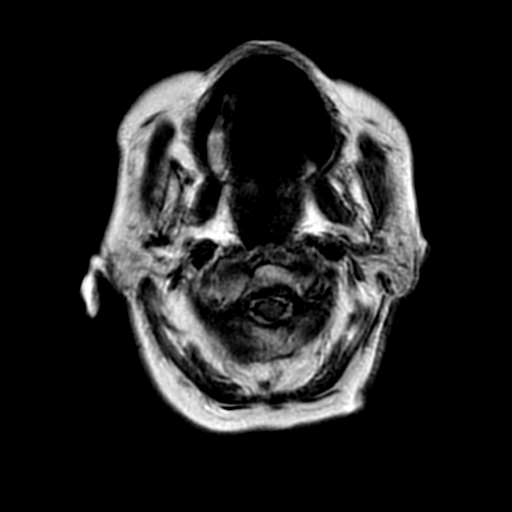
[im 25/25]
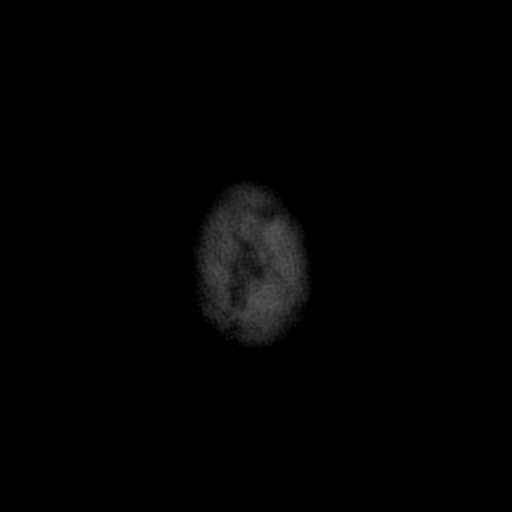

[Series 11: GRE · axial · 5.0mm · 0.47mm/px · z∈[-79,+62]mm · 2 of 25 slices shown]
[im 1/25]
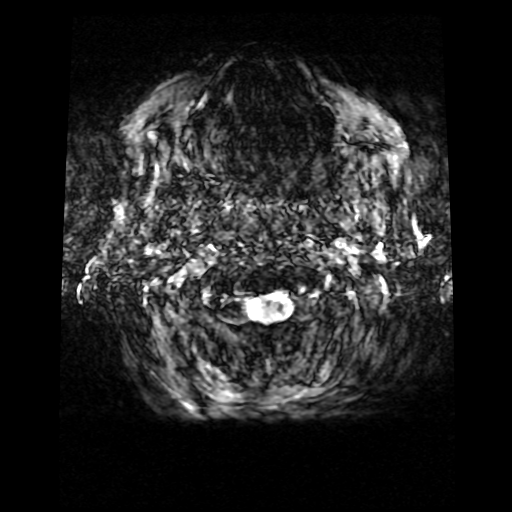
[im 25/25]
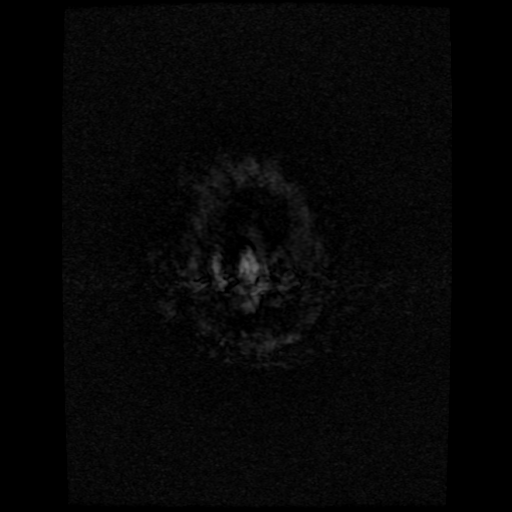

[Series 12: DWI · coronal · 4.0mm · 0.94mm/px · 5 of 70 slices shown (2 of 2)]
[im 1/70]
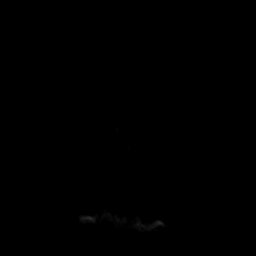
[im 18/70]
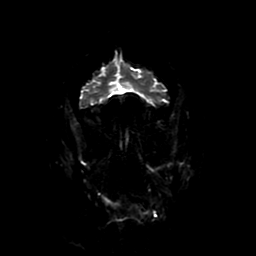
[im 35/70]
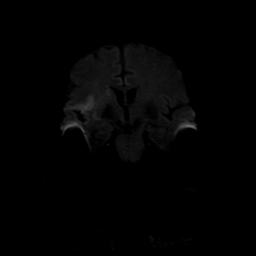
[im 52/70]
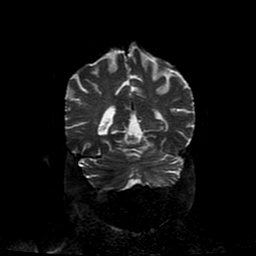
[im 70/70]
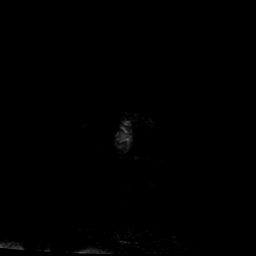

[Series 13: T2 · coronal · 5.0mm · 0.47mm/px · 2 of 26 slices shown (2 of 2)]
[im 1/26]
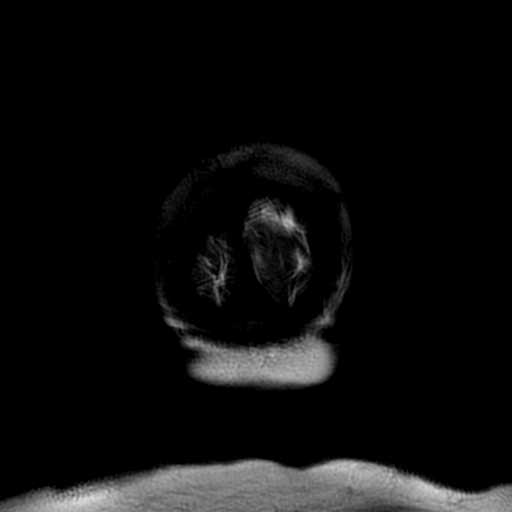
[im 26/26]
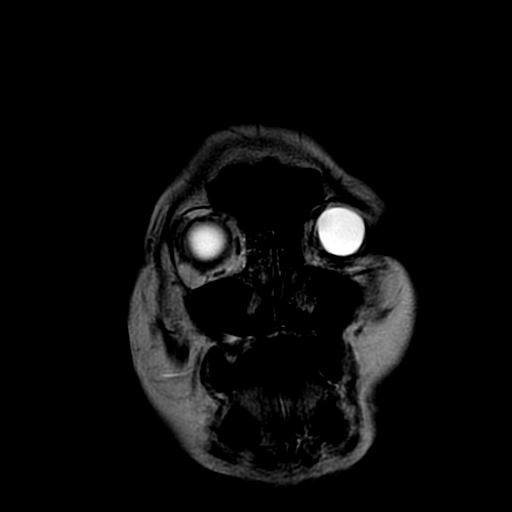

[Series 14: FLAIR · axial · 5.0mm · 0.47mm/px · z∈[-79,+62]mm · 2 of 25 slices shown (4 of 4)]
[im 1/25]
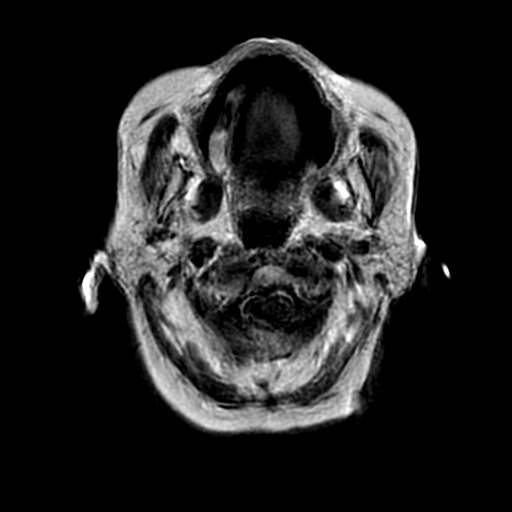
[im 25/25]
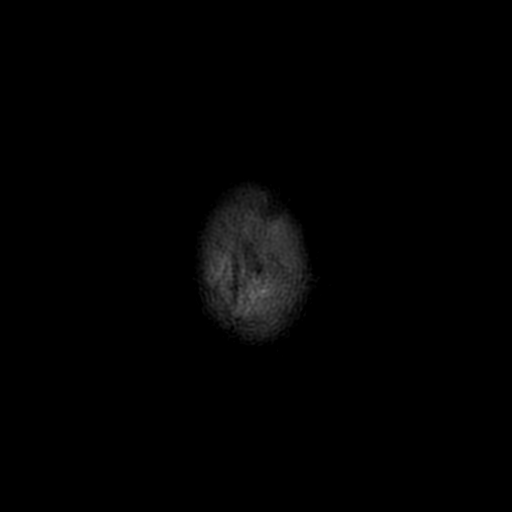

[Series 450: ADC · axial · 3.0mm · 0.94mm/px · z∈[-100,+46]mm · 4 of 50 slices shown (1 of 2)]
[im 1/50]
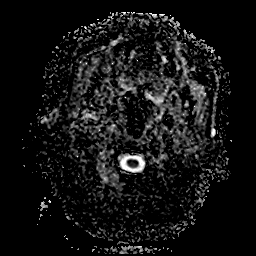
[im 17/50]
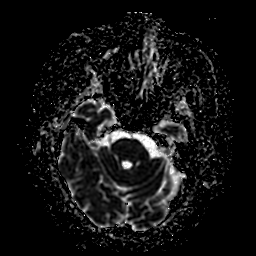
[im 33/50]
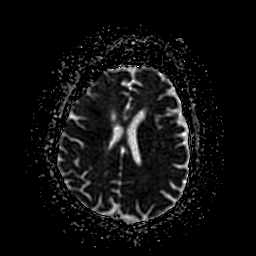
[im 50/50]
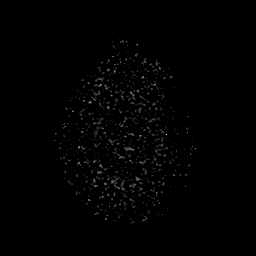

[Series 1250: ADC · coronal · 4.0mm · 0.94mm/px · 3 of 36 slices shown (2 of 2)]
[im 1/36]
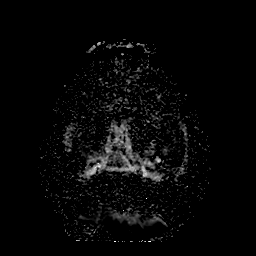
[im 18/36]
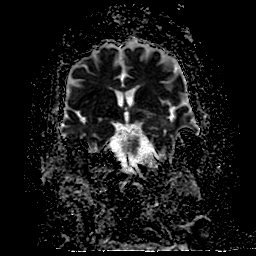
[im 36/36]
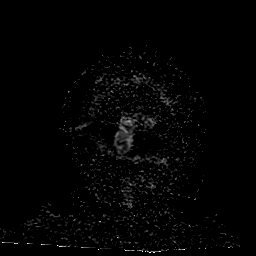

[32 of 48 positions shown; findings below may reference images not displayed]

FINDINGS: MRI HEAD FINDINGS

Brain: There are high values on diffusion-weighted imaging within
the tail of the right caudate nucleus and along the lateral right
frontal cortex of, insula and anterior right temporal lobe. ADC
values are mildly decreased but less so than would be expected in
the acute phase. The midline structures are normal. There is
associated hyperintense T2 weighted signal in the distribution
corresponding to the above-described diffusion abnormalities. No
mass lesion or midline shift. No hydrocephalus or extra-axial fluid
collection. No age advanced or lobar predominant atrophy.

Vascular: Major intracranial arterial and venous sinus flow voids
are preserved. No evidence of chronic microhemorrhage or amyloid
angiopathy.

Skull and upper cervical spine: The visualized skull base,
calvarium, upper cervical spine and extracranial soft tissues are
normal.

Sinuses/Orbits: No fluid levels or advanced mucosal thickening. No
mastoid effusion. Normal orbits.

MRA HEAD FINDINGS

Intracranial internal carotid arteries: Normal.

Anterior cerebral arteries: Absence of the right A1 segment is a
normal variant. Otherwise normal.

Middle cerebral arteries: Normal.

Posterior communicating arteries: Present bilaterally.

Posterior cerebral arteries: Normal.

Basilar artery: Normal.

Vertebral arteries: Codominant. Normal.

Superior cerebellar arteries: Normal.

Anterior inferior cerebellar arteries: Normal.

Posterior inferior cerebellar arteries: Normal.
IMPRESSION: 1. Areas of mild diffusion restriction within the right MCA
territory, including the right frontal cortex, right insula and
anterior right temporal cortex as well as the tail of the right
caudate nucleus. ADC values are higher than typically seen in the
acute setting, but this is still favored to indicate late acute to
early subacute ischemic infarct.
2. No hemorrhage or mass effect.
These results will be called to the ordering clinician or
representative by the Radiologist Assistant, and communication
documented in the PACS or zVision Dashboard.
